# Patient Record
Sex: Male | Born: 1965
Health system: Southern US, Community
[De-identification: ages and names within clinical notes are randomized; demographics above are authoritative.]

## PROBLEM LIST (undated history)

## (undated) DIAGNOSIS — I2510 Atherosclerotic heart disease of native coronary artery without angina pectoris: Secondary | ICD-10-CM

## (undated) DIAGNOSIS — J410 Simple chronic bronchitis: Secondary | ICD-10-CM

## (undated) DIAGNOSIS — J910 Malignant pleural effusion: Secondary | ICD-10-CM

## (undated) DIAGNOSIS — J302 Other seasonal allergic rhinitis: Secondary | ICD-10-CM

## (undated) DIAGNOSIS — R609 Edema, unspecified: Secondary | ICD-10-CM

## (undated) DIAGNOSIS — K4021 Bilateral inguinal hernia, without obstruction or gangrene, recurrent: Secondary | ICD-10-CM

## (undated) DIAGNOSIS — I252 Old myocardial infarction: Secondary | ICD-10-CM

## (undated) DIAGNOSIS — K219 Gastro-esophageal reflux disease without esophagitis: Secondary | ICD-10-CM

## (undated) DIAGNOSIS — R21 Rash and other nonspecific skin eruption: Secondary | ICD-10-CM

## (undated) DIAGNOSIS — Z955 Presence of coronary angioplasty implant and graft: Secondary | ICD-10-CM

## (undated) DIAGNOSIS — I428 Other cardiomyopathies: Secondary | ICD-10-CM

## (undated) DIAGNOSIS — C801 Malignant (primary) neoplasm, unspecified: Secondary | ICD-10-CM

## (undated) DIAGNOSIS — Z8679 Personal history of other diseases of the circulatory system: Secondary | ICD-10-CM

## (undated) DIAGNOSIS — I10 Essential (primary) hypertension: Secondary | ICD-10-CM

## (undated) DIAGNOSIS — I219 Acute myocardial infarction, unspecified: Secondary | ICD-10-CM

## (undated) DIAGNOSIS — Z973 Presence of spectacles and contact lenses: Secondary | ICD-10-CM

## (undated) HISTORY — DX: Essential (primary) hypertension: I10

## (undated) HISTORY — PX: CORONARY ANGIOPLASTY WITH STENT PLACEMENT: SHX49

## (undated) HISTORY — DX: Atherosclerotic heart disease of native coronary artery without angina pectoris: I25.10

## (undated) HISTORY — PX: INGUINAL HERNIA REPAIR: SUR1180

---

## 1989-06-16 HISTORY — PX: MANDIBLE FRACTURE SURGERY: SHX706

## 2010-05-18 ENCOUNTER — Encounter: Payer: Self-pay | Admitting: Cardiovascular Disease

## 2010-05-18 ENCOUNTER — Ambulatory Visit: Payer: Self-pay | Admitting: Cardiovascular Disease

## 2010-05-18 ENCOUNTER — Inpatient Hospital Stay (HOSPITAL_COMMUNITY): Admission: EM | Admit: 2010-05-18 | Discharge: 2010-05-19 | Payer: Self-pay | Admitting: Emergency Medicine

## 2010-05-18 HISTORY — PX: TRANSTHORACIC ECHOCARDIOGRAM: SHX275

## 2010-05-21 ENCOUNTER — Telehealth: Payer: Self-pay | Admitting: Physician Assistant

## 2010-05-23 ENCOUNTER — Telehealth: Payer: Self-pay | Admitting: Cardiovascular Disease

## 2010-05-30 ENCOUNTER — Telehealth: Payer: Self-pay | Admitting: Cardiovascular Disease

## 2010-05-31 DIAGNOSIS — I2589 Other forms of chronic ischemic heart disease: Secondary | ICD-10-CM | POA: Insufficient documentation

## 2010-05-31 DIAGNOSIS — F17210 Nicotine dependence, cigarettes, uncomplicated: Secondary | ICD-10-CM

## 2010-05-31 DIAGNOSIS — I2510 Atherosclerotic heart disease of native coronary artery without angina pectoris: Secondary | ICD-10-CM

## 2010-05-31 DIAGNOSIS — I10 Essential (primary) hypertension: Secondary | ICD-10-CM | POA: Insufficient documentation

## 2010-05-31 DIAGNOSIS — F1910 Other psychoactive substance abuse, uncomplicated: Secondary | ICD-10-CM

## 2010-05-31 DIAGNOSIS — I214 Non-ST elevation (NSTEMI) myocardial infarction: Secondary | ICD-10-CM | POA: Insufficient documentation

## 2010-06-08 ENCOUNTER — Encounter (INDEPENDENT_AMBULATORY_CARE_PROVIDER_SITE_OTHER): Payer: Self-pay | Admitting: Cardiology

## 2010-06-08 ENCOUNTER — Ambulatory Visit: Payer: Self-pay | Admitting: Cardiovascular Disease

## 2010-10-03 ENCOUNTER — Telehealth: Payer: Self-pay | Admitting: Cardiovascular Disease

## 2010-10-14 ENCOUNTER — Ambulatory Visit: Payer: Self-pay | Admitting: Cardiovascular Disease

## 2010-11-15 NOTE — Assessment & Plan Note (Signed)
Summary: eph/jml   Visit Type:  Follow-up Primary Provider:  none  CC:  no cardiac complaints.  History of Present Illness: 45 yo WM with history of tobacco abuse, HTN and CAD who was admitted to [facility] on May 18, 2010 with a NSTEMI and was found to have severe obstructive disease in the LAD. Two drug eluting stents were placed in the LAD. Mild hypokinesis of the anterolateral wall. He has done well since discharge. He has been taking all of his medications. He has had no recurrent chest pain, SOB or palpitations. He is asking to return to work.   Current Medications (verified): 1)  Aspirin Ec 325 Mg Tbec (Aspirin) .... Take One Tablet By Mouth Daily 2)  Lisinopril 5 Mg Tabs (Lisinopril) .Marland Kitchen.. 1 Tab Once Daily 3)  Metoprolol Tartrate 25 Mg Tabs (Metoprolol Tartrate) .Marland Kitchen.. 1 Tab Two Times A Day 4)  Nicotine 21 Mg/24hr Pt24 (Nicotine) .... Apply Once Daily X 6 Weeks 5)  Nicotine 14 Mg/24hr Pt24 (Nicotine) .... Start After 6 Weeks of The 21 Mg Patch.Marland Kitchenapply Patch X 2 Wks Then Cut in Half and Used 1/2 Dose X 2 Wks 6)  Nitrostat 0.4 Mg Subl (Nitroglycerin) .Marland Kitchen.. 1 Tablet Under Tongue At Onset of Chest Pain; You May Repeat Every 5 Minutes For Up To 3 Doses. 7)  Effient 10 Mg Tabs (Prasugrel Hcl) .Marland Kitchen.. 1 Tab Once Daily 8)  Simvastatin 40 Mg Tabs (Simvastatin) .Marland Kitchen.. 1 Tab At Bedtime 9)  Tylenol 325 Mg Tabs (Acetaminophen) .Marland Kitchen.. 1 Tab As Directed  Allergies (verified): No Known Drug Allergies  Past History:  Past Medical History: CAD s/p cath May 18, 2010 with overlapping drug eluting stents LAD. Tobacco abuse HTN  Past Surgical History: Status post double hernia repair Broken jaw repair  Family History: Mother-alive, healthy Father deceased, CAD/brain tumor One sister and two brothers alive and well, no CAD  Social History: The patient lives in [place] with his wife.   He has  3 children.   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  He has a  35-pack-year smoking history, he has stopped smoking.  He does drink socially as well as uses marijuana.      Review of Systems  The patient denies fatigue, malaise, fever, weight gain/loss, vision loss, decreased hearing, hoarseness, chest pain, palpitations, shortness of breath, prolonged cough, wheezing, sleep apnea, coughing up blood, abdominal pain, blood in stool, nausea, vomiting, diarrhea, heartburn, incontinence, blood in urine, muscle weakness, joint pain, leg swelling, rash, skin lesions, headache, fainting, dizziness, depression, anxiety, enlarged lymph nodes, easy bruising or bleeding, and environmental allergies.    Vital Signs:  Patient profile:   45 year old male Height:      69 inches Weight:      167 pounds BMI:     24.75 Pulse rate:   88 / minute BP sitting:   133 / 78  (left arm) Cuff size:   regular  Vitals Entered By: Burnett Kanaris, CNA (June 08, 2010 2:02 PM)  Physical Exam  General:  General: Well developed, well nourished, NAD HEENT: OP clear, mucus membranes moist SKIN: warm, dry Neuro: No focal deficits Musculoskeletal: Muscle strength 5/5 all ext Psychiatric: Mood and affect normal Neck: No JVD, no carotid bruits, no thyromegaly, no lymphadenopathy. Lungs:Clear bilaterally, no wheezes, rhonci, crackles CV: RRR no murmurs, gallops rubs Abdomen: soft, NT, ND, BS present Extremities: No edema, pulses 2+.    EKG  Procedure date:  06/08/2010  Findings:  NSR, rate 88 bpm. Poor R wave progression precordial leads.   Echocardiogram  Procedure date:  05/18/2010  Findings:       Left ventricle: Septal, apical and inferoapical hypokinesis The     cavity size was mildly dilated. Wall thickness was normal.     Systolic function was mildly reduced. The estimated ejection     fraction was in the range of 45% to 50%.   - Atrial septum: No defect or patent foramen ovale was identified.  Cardiac Cath  Procedure date:  05/18/2010  Findings:       1. The left main coronary artery had no evidence of disease.   2. The left anterior descending was a large vessel that coursed to the       apex.  The proximal portion of the vessel had a long tubular 80%       stenosis that extended just past the first diagonal branch.  The       mid vessel had a hazy ulcerated-appearing 90% stenosis.  The distal       vessel had no obstructive disease.  The first diagonal branch was       small in caliber and had a 90% proximal stenosis.  The second and       third diagonal branches were very small in caliber.   3. The circumflex artery was a moderate-sized vessel that gave off a       large bifurcating obtuse marginal branch.  The AV groove circumflex       was very small in caliber.  The second obtuse marginal branch was       moderate size in caliber.  The ostial portion of the circumflex       appeared to have a 30% plaque.  The mid vessel had a 20% plaque.       The first obtuse marginal branch was a moderate-sized bifurcating       vessel with mild plaque disease.  The second obtuse marginal branch       appeared to have a 50% stenosis.   4. The right coronary artery was a moderate-sized dominant vessel with       mild plaque disease in the midportion of the vessel.   5. Left ventricular angiogram was performed in the RAO projection and       showed hypokinesis of anterolateral wall and apex with overall       ejection fraction of 50%.      IMPRESSION:   1. Single-vessel coronary artery disease.   2. Successful percutaneous coronary intervention with placement of a       drug-eluting stent in the mid LAD and a drug-eluting stent in the       proximal LAD.   3. Segmental wall motion abnormality with preserved left ventricular       systolic function.   Impression & Recommendations:  Problem # 1:  CAD, NATIVE VESSEL (ICD-414.01)  s/p NSTEMI 3 weeks ago, now s/p drug eluting stents x 2 in the LAD. He is tolerating the beta blocker,  Ace-inhibito well. He is on ASA and Effient for dual antiplatelet therapy. He was able to get one month free at time of discharge. He has applied for the assistance program for Effient. I am giving him an additional 6 week supply today of samples. No chest pain or SOB since discharge.   His updated medication list for this problem includes:    Aspirin Ec 325 Mg  Tbec (Aspirin) .Marland Kitchen... Take one tablet by mouth daily    Lisinopril 5 Mg Tabs (Lisinopril) .Marland Kitchen... 1 tab once daily    Metoprolol Tartrate 25 Mg Tabs (Metoprolol tartrate) .Marland Kitchen... 1 tab two times a day    Nitrostat 0.4 Mg Subl (Nitroglycerin) .Marland Kitchen... 1 tablet under tongue at onset of chest pain; you may repeat every 5 minutes for up to 3 doses.    Effient 10 Mg Tabs (Prasugrel hcl) .Marland Kitchen... 1 tab once daily  His updated medication list for this problem includes:    Aspirin Ec 325 Mg Tbec (Aspirin) .Marland Kitchen... Take one tablet by mouth daily    Lisinopril 5 Mg Tabs (Lisinopril) .Marland Kitchen... 1 tab once daily    Metoprolol Tartrate 25 Mg Tabs (Metoprolol tartrate) .Marland Kitchen... 1 tab two times a day    Nitrostat 0.4 Mg Subl (Nitroglycerin) .Marland Kitchen... 1 tablet under tongue at onset of chest pain; you may repeat every 5 minutes for up to 3 doses.    Effient 10 Mg Tabs (Prasugrel hcl) .Marland Kitchen... 1 tab once daily  Orders: EKG w/ Interpretation (93000)  Problem # 2:  CARDIOMYOPATHY, ISCHEMIC (ICD-414.8) Volume status ok. Will repeat echo after next appt.   His updated medication list for this problem includes:    Aspirin Ec 325 Mg Tbec (Aspirin) .Marland Kitchen... Take one tablet by mouth daily    Lisinopril 5 Mg Tabs (Lisinopril) .Marland Kitchen... 1 tab once daily    Metoprolol Tartrate 25 Mg Tabs (Metoprolol tartrate) .Marland Kitchen... 1 tab two times a day    Nitrostat 0.4 Mg Subl (Nitroglycerin) .Marland Kitchen... 1 tablet under tongue at onset of chest pain; you may repeat every 5 minutes for up to 3 doses.    Effient 10 Mg Tabs (Prasugrel hcl) .Marland Kitchen... 1 tab once daily  Problem # 3:  HYPERTENSION, BORDERLINE (ICD-401.9) BP  well controlled.   His updated medication list for this problem includes:    Aspirin Ec 325 Mg Tbec (Aspirin) .Marland Kitchen... Take one tablet by mouth daily    Lisinopril 5 Mg Tabs (Lisinopril) .Marland Kitchen... 1 tab once daily    Metoprolol Tartrate 25 Mg Tabs (Metoprolol tartrate) .Marland Kitchen... 1 tab two times a day  Patient Instructions: 1)  Your physician recommends that you schedule a follow-up appointment in: 6 months 2)  Your physician recommends that you continue on your current medications as directed. Please refer to the Current Medication list given to you today. Prescriptions: EFFIENT 10 MG TABS (PRASUGREL HCL) 1 tab once daily  #42 x 0   Entered by:   Whitney Maeola Sarah RN   Authorized by:   Verne Carrow, MD   Signed by:   Ellender Hose RN on 06/08/2010   Method used:   Samples Given   RxID:   [id number]

## 2010-11-15 NOTE — Progress Notes (Signed)
Summary: assitance program has questions regarding effient  Phone Note Refill Request Call back at [phone] Message from:  Pharmacy on [place] care pt assit program  ref# [id number] / has questions regarding application for effient  Initial call taken by: Omer Jack,  May 30, 2010 11:02 AM  Follow-up for Phone Call        [facility] customer service...they advised me that they need  proof of income for application. They will call patient for documentation of income. Follow-up by: Suzan Garibaldi RN     Appended Document: assitance program has questions regarding effient Spoke with [facility] cares. Pt has been approved for assitance program. Effient should arrive in office in 7-10 business days.  Appended Document: assitance program has questions regarding effient Effient here & pt.notified. Medication is at the front desk.

## 2010-11-15 NOTE — Letter (Signed)
Summary: Return To Work  [employer], Main Office  [address]   [place], [place] [address]   Phone: [phone]  Fax: [phone]    06/08/2010  TO: Leodis Sias IT MAY CONCERN   RE: KANON NOVOSEL [address] [id number]   The above named individual is under my medical care and may return to work on: August 24th, 2011.  If you have any further questions or need additional information, please call.     Sincerely,  Whitney Maeola Sarah RN for Verne Carrow, MD

## 2010-11-15 NOTE — Progress Notes (Signed)
Summary: CALLING REAGRDING PRESCRIPTION FOR XANAX  Phone Note Call from Patient Call back at [phone]   Caller: Patient Summary of Call: PT CALLING TO GET A PRESCRIPTION FOR Anthony Moon Initial call taken by: Judie Grieve,  May 23, 2010 9:06 AM  Follow-up for Phone Call        I talked with pt and told him Dr. Clifton James would not prescribe Xanax for him.  Follow-up by: Dossie Arbour, RN, BSN,  May 23, 2010 10:18 AM     Appended Document: CALLING REAGRDING PRESCRIPTION FOR Anthony Moon Effient arrived in the office and placed at the front desk for pick-up.  Pt aware. Order #[id number].

## 2010-11-15 NOTE — Letter (Signed)
Summary: Cardiac Rehab Program  Cardiac Rehab Program   Imported By: Marylou Brandle 06/06/2010 15:38:10  _____________________________________________________________________  External Attachment:    Type:   Image     Comment:   External Document

## 2010-11-15 NOTE — Progress Notes (Signed)
  Phone Note Call from Patient   Summary of Call: Patient called with complaints of anxiety.  Just d/c from [facility] with NSTEMI. UDS + for THC and benzo's. Had xanax couple times while here. Counseled on d/c drug use and nonauthorized use of benzo's prior to d/c Anthony Moon now asking for xanax Rx.  Has anxiety over hospital bill, etc.  Tried to quit cigs.  Smoked x 1 today b/c he is so stressed. Explained to Anthony Moon that I cannot prescribe xanax to him given his history.  Rx should have been requested on d/c. I will Rx vistaril for him to use as needed for anxiety. He is agreeable to this. Warned him of side effects.  He should not drive when taking. Initial call taken by: Tereso Newcomer PA-C,  May 21, 2010 11:38 AM    New/Updated Medications: VISTARIL 25 MG CAPS (HYDROXYZINE PAMOATE) Take 1 by mouth every 8 hours as needed for anxiety Prescriptions: VISTARIL 25 MG CAPS (HYDROXYZINE PAMOATE) Take 1 by mouth every 8 hours as needed for anxiety  #20 x 0   Entered and Authorized by:   Tereso Newcomer PA-C   Signed by:   Tereso Newcomer PA-C on 05/21/2010   Method used:   Electronically to        [facility] # [phone]* (retail)       [address]       [place], [place]  [address]       Ph: [phone]       Fax: [phone]   RxID:   [id number]

## 2010-11-17 NOTE — Assessment & Plan Note (Signed)
Summary: per pt spouse call pt is SOB and has to have dental work and ...   Visit Type:  ov Primary Provider:  none  CC:  shortness of breath when he bends over and .  History of Present Illness: 45 yo WM with history of tobacco abuse, HTN and CAD who was admitted to [facility] on May 18, 2010 with a NSTEMI and was found to have severe obstructive disease in the LAD. Two drug eluting stents were placed in the LAD. Mild hypokinesis of the anterolateral wall. He has done well since discharge. He has been taking all of his medications. He has gained weight. His activity level has been down. He has stopped smoking completely. 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 No chest pain. Occasional SOB when bending over secondary to his weight gain.   Current Medications (verified): 1)  Aspirin Ec 325 Mg Tbec (Aspirin) .... Take One Tablet By Mouth Daily 2)  Lisinopril 5 Mg Tabs (Lisinopril) .Marland Kitchen.. 1 Tab Once Daily 3)  Metoprolol Tartrate 25 Mg Tabs (Metoprolol Tartrate) .Marland Kitchen.. 1 Tab Two Times A Day 4)  Nitrostat 0.4 Mg Subl (Nitroglycerin) .Marland Kitchen.. 1 Tablet Under Tongue At Onset of Chest Pain; You May Repeat Every 5 Minutes For Up To 3 Doses. 5)  Effient 10 Mg Tabs (Prasugrel Hcl) .Marland Kitchen.. 1 Tab Once Daily 6)  Simvastatin 40 Mg Tabs (Simvastatin) .Marland Kitchen.. 1 Tab At Bedtime 7)  Tylenol 325 Mg Tabs (Acetaminophen) .Marland Kitchen.. 1 Tab As Directed 8)  Hydrocodone-Acetaminophen 5-500 Mg Tabs (Hydrocodone-Acetaminophen) .Marland Kitchen.. 1 Tablet Every 4-6 Hrs As Needed,  Allergies (verified): No Known Drug Allergies  Past History:  Past Medical History: CAD s/p cath May 18, 2010 with overlapping drug eluting stents LAD. Tobacco abuse-stopped smoking HTN  Social History: Reviewed history from 06/08/2010 and no changes required. The patient lives in [place] with his wife.   He has  3 children.  Enrolled in [employer]. Works at [employer] station He has a 35-pack-year  smoking history, he has stopped smoking.  He does drink socially      Review of Systems  The patient denies fatigue, malaise, fever, weight gain/loss, vision loss, decreased hearing, hoarseness, chest pain, palpitations, shortness of breath, prolonged cough, wheezing, sleep apnea, coughing up blood, abdominal pain, blood in stool, nausea, vomiting, diarrhea, heartburn, incontinence, blood in urine, muscle weakness, joint pain, leg swelling, rash, skin lesions, headache, fainting, dizziness, depression, anxiety, enlarged lymph nodes, easy bruising or bleeding, and environmental allergies.    Vital Signs:  Patient profile:   45 year old male Height:      69 inches Weight:      180.75 pounds BMI:     26.79 Pulse rate:   66 / minute BP sitting:   130 / 84  (left arm) Cuff size:   regular  Vitals Entered By: Caralee Ates CMA (October 14, 2010 2:53 PM)  Physical Exam  General:  General: Well developed, well nourished, NAD Musculoskeletal: Muscle strength 5/5 all ext Psychiatric: Mood and affect normal Neck: No JVD, no carotid bruits, no thyromegaly, no lymphadenopathy. Lungs:Clear bilaterally, no wheezes, rhonci, crackles CV: RRR no murmurs, gallops rubs Abdomen: soft, NT, ND, BS present Extremities: No edema, pulses 2+.    Impression & Recommendations:  Problem # 1:  CAD, NATIVE VESSEL (ICD-414.01) Stable.  Will continue ASA and Effient for at least one year. Will reduce ASA dose to 81 mg by mouth Qdaily.  Continue other meds. I will see him back in 6 months. Will arrange repeat echo in 6 months and will arrange fasting lipids and LFTs in 6 months when they have insurance.  We will try to help with Effient. He should have Effient from the manufacturer coming to our office.   His updated medication list for this problem includes:    Aspirin Ec 325 Mg Tbec (Aspirin) .Marland Kitchen... Take one tablet by mouth daily    Lisinopril 5 Mg Tabs (Lisinopril) .Marland Kitchen... 1 tab once daily    Metoprolol Tartrate  25 Mg Tabs (Metoprolol tartrate) .Marland Kitchen... 1 tab two times a day    Nitrostat 0.4 Mg Subl (Nitroglycerin) .Marland Kitchen... 1 tablet under tongue at onset of chest pain; you may repeat every 5 minutes for up to 3 doses.    Effient 10 Mg Tabs (Prasugrel hcl) .Marland Kitchen... 1 tab once daily  Patient Instructions: 1)  Your physician recommends that you schedule a follow-up appointment in: 6 months with Dr. Clifton James 2)  Your physician has recommended you make the following change in your medication:

## 2010-11-17 NOTE — Progress Notes (Signed)
Summary: need medical clearance for dental work  Phone Note Call from Patient Call back at [facility] Phone [phone]   Caller: [phone] Reason for Call: Talk to Nurse Summary of Call: pt need dental work - tooth pull need medical clearance. Initial call taken by: Lorne Skeens,  October 03, 2010 1:36 PM  Follow-up for Phone Call        Patient has a broken tooth and will probably need to be off of Effient in order to repair the tooth. I explained to him that since he needs to be on Effient for at least a year so until August 2012 that we could not clear him from a cardiac standpoint. He understands.  Whitney Maeola Sarah RN  October 03, 2010 5:22 PM  Follow-up by: Whitney Maeola Sarah RN,  October 03, 2010 5:23 PM

## 2010-12-17 ENCOUNTER — Telehealth (INDEPENDENT_AMBULATORY_CARE_PROVIDER_SITE_OTHER): Payer: Self-pay | Admitting: *Deleted

## 2010-12-22 NOTE — Progress Notes (Addendum)
Summary: Cardiology Phone Note - Refills  Phone Note Call from Patient   Reason for Call: Refill Medication Summary of Call: Returned pt call concerning refills for Simvastatin, Lopressor and Lisinopril.  I have called in enough refills until time for his next follow up.  I have called them into [facility] at [phone].  Pt appreciated the call back. 1. Simvastatin 40mg  daily #30 RF3 2. Lopressor 25mg  two times a day #60 RF3 3. Lisinopril 5 mg daily #30 RF3 Initial call taken by: Robbi Garter NP-PA,  December 17, 2010 4:23 PM

## 2010-12-23 ENCOUNTER — Inpatient Hospital Stay (HOSPITAL_COMMUNITY)
Admission: EM | Admit: 2010-12-23 | Discharge: 2010-12-25 | DRG: 315 | Disposition: A | Discharge status: Home / Self Care | Payer: Medicaid Other | Attending: Cardiovascular Disease | Admitting: Cardiovascular Disease

## 2010-12-23 ENCOUNTER — Emergency Department (HOSPITAL_COMMUNITY): Payer: Medicaid Other

## 2010-12-23 DIAGNOSIS — I428 Other cardiomyopathies: Secondary | ICD-10-CM | POA: Diagnosis present

## 2010-12-23 DIAGNOSIS — I252 Old myocardial infarction: Secondary | ICD-10-CM

## 2010-12-23 DIAGNOSIS — Z7982 Long term (current) use of aspirin: Secondary | ICD-10-CM

## 2010-12-23 DIAGNOSIS — Z87891 Personal history of nicotine dependence: Secondary | ICD-10-CM

## 2010-12-23 DIAGNOSIS — K209 Esophagitis, unspecified without bleeding: Secondary | ICD-10-CM | POA: Diagnosis present

## 2010-12-23 DIAGNOSIS — Z9861 Coronary angioplasty status: Secondary | ICD-10-CM

## 2010-12-23 DIAGNOSIS — R079 Chest pain, unspecified: Secondary | ICD-10-CM

## 2010-12-23 DIAGNOSIS — K219 Gastro-esophageal reflux disease without esophagitis: Secondary | ICD-10-CM | POA: Diagnosis present

## 2010-12-23 DIAGNOSIS — I2510 Atherosclerotic heart disease of native coronary artery without angina pectoris: Secondary | ICD-10-CM | POA: Diagnosis present

## 2010-12-23 DIAGNOSIS — I10 Essential (primary) hypertension: Secondary | ICD-10-CM | POA: Diagnosis present

## 2010-12-23 DIAGNOSIS — E785 Hyperlipidemia, unspecified: Secondary | ICD-10-CM | POA: Diagnosis present

## 2010-12-23 DIAGNOSIS — I300 Acute nonspecific idiopathic pericarditis: Principal | ICD-10-CM | POA: Diagnosis present

## 2010-12-23 DIAGNOSIS — Z79899 Other long term (current) drug therapy: Secondary | ICD-10-CM

## 2010-12-23 LAB — CBC
HCT: 43.5 % (ref 39.0–52.0)
MCH: 32.8 pg (ref 26.0–34.0)
MCHC: 35.6 g/dL (ref 30.0–36.0)
MCV: 92 fL (ref 78.0–100.0)
RBC: 4.73 MIL/uL (ref 4.22–5.81)
WBC: 15.9 10*3/uL — ABNORMAL HIGH (ref 4.0–10.5)

## 2010-12-23 LAB — DIFFERENTIAL
Basophils Absolute: 0 10*3/uL (ref 0.0–0.1)
Basophils Relative: 0 % (ref 0–1)
Eosinophils Absolute: 0.2 10*3/uL (ref 0.0–0.7)
Eosinophils Relative: 1 % (ref 0–5)
Lymphocytes Relative: 13 % (ref 12–46)
Lymphs Abs: 2 10*3/uL (ref 0.7–4.0)
Neutro Abs: 12.6 10*3/uL — ABNORMAL HIGH (ref 1.7–7.7)
Neutrophils Relative %: 79 % — ABNORMAL HIGH (ref 43–77)

## 2010-12-23 LAB — HEPATIC FUNCTION PANEL
AST: 30 U/L (ref 0–37)
Albumin: 4.1 g/dL (ref 3.5–5.2)
Indirect Bilirubin: 0.7 mg/dL (ref 0.3–0.9)
Total Bilirubin: 0.9 mg/dL (ref 0.3–1.2)

## 2010-12-23 LAB — BASIC METABOLIC PANEL
BUN: 16 mg/dL (ref 6–23)
Chloride: 98 mEq/L (ref 96–112)
Glucose, Bld: 101 mg/dL — ABNORMAL HIGH (ref 70–99)
Sodium: 132 mEq/L — ABNORMAL LOW (ref 135–145)

## 2010-12-23 LAB — POCT CARDIAC MARKERS
CKMB, poc: 1.4 ng/mL (ref 1.0–8.0)
Myoglobin, poc: 58.6 ng/mL (ref 12–200)

## 2010-12-23 LAB — PROTIME-INR: Prothrombin Time: 12.4 seconds (ref 11.6–15.2)

## 2010-12-24 DIAGNOSIS — R072 Precordial pain: Secondary | ICD-10-CM

## 2010-12-24 DIAGNOSIS — I319 Disease of pericardium, unspecified: Secondary | ICD-10-CM

## 2010-12-24 LAB — HEPARIN LEVEL (UNFRACTIONATED): Heparin Unfractionated: 0.29 IU/mL — ABNORMAL LOW (ref 0.30–0.70)

## 2010-12-24 LAB — MRSA PCR SCREENING: MRSA by PCR: NEGATIVE

## 2010-12-24 LAB — COMPREHENSIVE METABOLIC PANEL
ALT: 46 U/L (ref 0–53)
AST: 23 U/L (ref 0–37)
Alkaline Phosphatase: 51 U/L (ref 39–117)
Creatinine, Ser: 0.94 mg/dL (ref 0.4–1.5)
Sodium: 135 mEq/L (ref 135–145)
Total Bilirubin: 0.9 mg/dL (ref 0.3–1.2)
Total Protein: 6.8 g/dL (ref 6.0–8.3)

## 2010-12-24 LAB — CK TOTAL AND CKMB (NOT AT ARMC)
Relative Index: 1.6 (ref 0.0–2.5)
Relative Index: 1.7 (ref 0.0–2.5)
Total CK: 110 U/L (ref 7–232)

## 2010-12-24 LAB — LIPID PANEL
Cholesterol: 150 mg/dL (ref 0–200)
VLDL: 14 mg/dL (ref 0–40)

## 2010-12-27 ENCOUNTER — Telehealth: Payer: Self-pay | Admitting: Cardiovascular Disease

## 2010-12-30 LAB — PROTIME-INR
INR: 0.96 (ref 0.00–1.49)
Prothrombin Time: 13 seconds (ref 11.6–15.2)
Prothrombin Time: 13.4 seconds (ref 11.6–15.2)

## 2010-12-30 LAB — LIPID PANEL
HDL: 57 mg/dL (ref >39)
LDL Cholesterol: 116 mg/dL — ABNORMAL HIGH (ref 0–99)
Triglycerides: 108 mg/dL (ref <150)
VLDL: 22 mg/dL (ref 0–40)

## 2010-12-30 LAB — CBC
HCT: 41.5 % (ref 39.0–52.0)
HCT: 44.8 % (ref 39.0–52.0)
Hemoglobin: 14.3 g/dL (ref 13.0–17.0)
Hemoglobin: 15.6 g/dL (ref 13.0–17.0)
MCH: 32.4 pg (ref 26.0–34.0)
MCH: 32.7 pg (ref 26.0–34.0)
MCV: 92.9 fL (ref 78.0–100.0)
Platelets: 203 10*3/uL (ref 150–400)
RBC: 4.24 MIL/uL (ref 4.22–5.81)
RBC: 4.37 MIL/uL (ref 4.22–5.81)
RBC: 4.82 MIL/uL (ref 4.22–5.81)
RDW: 13 % (ref 11.5–15.5)
WBC: 10.4 10*3/uL (ref 4.0–10.5)

## 2010-12-30 LAB — POCT I-STAT, CHEM 8
BUN: 10 mg/dL (ref 6–23)
Calcium, Ion: 1.07 mmol/L — ABNORMAL LOW (ref 1.12–1.32)
Calcium, Ion: 1.12 mmol/L (ref 1.12–1.32)
Chloride: 101 mEq/L (ref 96–112)
Creatinine, Ser: 1 mg/dL (ref 0.4–1.5)
HCT: 42 % (ref 39.0–52.0)
Hemoglobin: 16.7 g/dL (ref 13.0–17.0)
Potassium: 3.4 mEq/L — ABNORMAL LOW (ref 3.5–5.1)
Sodium: 136 mEq/L (ref 135–145)

## 2010-12-30 LAB — BASIC METABOLIC PANEL
CO2: 29 mEq/L (ref 19–32)
Calcium: 9 mg/dL (ref 8.4–10.5)
GFR calc Af Amer: >60 mL/min (ref >60)
Glucose, Bld: 114 mg/dL — ABNORMAL HIGH (ref 70–99)
Potassium: 4.1 mEq/L (ref 3.5–5.1)
Sodium: 140 mEq/L (ref 135–145)

## 2010-12-30 LAB — URINE CULTURE
Colony Count: NO GROWTH
Culture  Setup Time: 201108031920
Culture: NO GROWTH

## 2010-12-30 LAB — COMPREHENSIVE METABOLIC PANEL
AST: 62 U/L — ABNORMAL HIGH (ref 0–37)
Albumin: 3.6 g/dL (ref 3.5–5.2)
Alkaline Phosphatase: 62 U/L (ref 39–117)
BUN: 10 mg/dL (ref 6–23)
Chloride: 97 mEq/L (ref 96–112)
Creatinine, Ser: 0.79 mg/dL (ref 0.4–1.5)
GFR calc Af Amer: >60 mL/min (ref >60)
Potassium: 3.2 mEq/L — ABNORMAL LOW (ref 3.5–5.1)
Total Bilirubin: 0.7 mg/dL (ref 0.3–1.2)
Total Protein: 6 g/dL (ref 6.0–8.3)

## 2010-12-30 LAB — URINALYSIS, MICROSCOPIC ONLY
Bilirubin Urine: NEGATIVE
Leukocytes, UA: NEGATIVE
Nitrite: NEGATIVE
Specific Gravity, Urine: 1.043 — ABNORMAL HIGH (ref 1.005–1.030)
Urobilinogen, UA: 0.2 mg/dL (ref 0.0–1.0)
pH: 8.5 — ABNORMAL HIGH (ref 5.0–8.0)

## 2010-12-30 LAB — DIFFERENTIAL
Basophils Absolute: 0 10*3/uL (ref 0.0–0.1)
Eosinophils Absolute: 0.1 10*3/uL (ref 0.0–0.7)
Eosinophils Relative: 1 % (ref 0–5)
Lymphocytes Relative: 10 % — ABNORMAL LOW (ref 12–46)
Lymphocytes Relative: 10 % — ABNORMAL LOW (ref 12–46)
Monocytes Absolute: 0.9 10*3/uL (ref 0.1–1.0)
Monocytes Relative: 8 % (ref 3–12)
Monocytes Relative: 9 % (ref 3–12)
Neutro Abs: 12.1 10*3/uL — ABNORMAL HIGH (ref 1.7–7.7)
Neutro Abs: 8.3 10*3/uL — ABNORMAL HIGH (ref 1.7–7.7)
Neutrophils Relative %: 82 % — ABNORMAL HIGH (ref 43–77)

## 2010-12-30 LAB — RAPID URINE DRUG SCREEN, HOSP PERFORMED
Amphetamines: NOT DETECTED
Tetrahydrocannabinol: POSITIVE — AB

## 2010-12-30 LAB — CARDIAC PANEL(CRET KIN+CKTOT+MB+TROPI)
Relative Index: 4.1 — ABNORMAL HIGH (ref 0.0–2.5)
Troponin I: 9.03 ng/mL (ref 0.00–0.06)

## 2010-12-30 LAB — APTT: aPTT: >200 seconds (ref 24–37)

## 2010-12-30 LAB — POCT CARDIAC MARKERS: Myoglobin, poc: 496 ng/mL (ref 12–200)

## 2011-01-03 NOTE — Progress Notes (Signed)
Summary: pt wants to talk to provider   Phone Note Call from Patient Call back at Home Phone [phone]   Caller: Spouse Reason for Call: Talk to Nurse, Talk to Doctor Summary of Call: pt spouse has some questions.Anthony Moon was seen in the [facility] this weekend but is home now and he is ok but they really would like to talk to Dr. Clifton James cause they have questions Initial call taken by: Omer Jack,  December 27, 2010 12:48 PM  Follow-up for Phone Call        Patient needed to make a f/u appt to see Dr. Clifton James post hospital. He is scheduled 01/16/11. Whitney Maeola Sarah RN  December 27, 2010 2:02 PM  Follow-up by: Whitney Maeola Sarah RN,  December 27, 2010 2:02 PM

## 2011-01-06 ENCOUNTER — Encounter: Payer: Self-pay | Admitting: *Deleted

## 2011-01-12 NOTE — H&P (Signed)
Anthony Moon, Anthony NO.:  [id number]  MEDICAL RECORD NO.:  [id number]           PATIENT TYPE:  I  LOCATION:  2508                         FACILITY:  [facility]  PHYSICIAN:  Anthony Flor, MD   DATE OF BIRTH:  1966/03/09  DATE OF ADMISSION:  12/23/2010 DATE OF DISCHARGE:                             HISTORY & PHYSICAL   ADMISSION DIAGNOSES: 1. Chest pain. 2. Coronary artery disease.  CHIEF COMPLAINT:  Chest pain.  HISTORY OF PRESENT ILLNESS:  Anthony Moon is a pleasant 45 year old white male with known coronary disease with a non-ST elevation MI in August 2011 status post drug-eluting stent x2 to his proximal and mid LAD who presents to the ER tonight with chest pain.  His pain began at 2:30 p.m. while he was drinking water.  It came on suddenly and felt like a sharp pressure in his chest.  Since that time, it is coming on in waves as bad as 7/10.  He does not think it is worse with lying flat and better with sitting up.  He does not have any exertional symptoms.  He has not had shortness of breath or nausea or diaphoresis.  The pain was minimally responsive to nitroglycerin, but improved with morphine in the emergency room.  PAST MEDICAL HISTORY: 1. Hypertension. 2. Hyperlipidemia. 3. Coronary artery disease:  Non-ST-elevation MI, August 2011,     receiving Promus drug-eluting stent x2 to the proximal mid LAD. 4. Previous inguinal hernia repair.  MEDICATIONS: 1. Metoprolol 25 mg b.i.d. 2. Aspirin 325 daily.  This was recently decreased to 81, but he is     still taking 325 currently. 3. Lisinopril 5 mg daily. 4. Effient 10 mg daily. 5. Simvastatin 40 mg daily.  ALLERGIES:  None.  FAMILY HISTORY:  He has a father who had an MI at age 20, but there is no early coronary disease.  SOCIAL HISTORY:  He currently is working as a Conservation officer, nature, drinks approximately 12 beers per week.  He used to smoke up to 2 packs per day, but quit in August at the time of his  myocardial infarction.  REVIEW OF SYSTEMS:  Full review of systems was obtained and is negative except as stated in the HPI.  PHYSICAL EXAMINATION:  VITAL SIGNS:  Blood pressure 162/92, now 128/80, pulse 102, respirations 20, O2 sats 98% on room air, temperature 98.1. GENERAL:  No acute distress. HEENT:  Extraocular movements intact.  Oropharynx benign.  Nonicteric sclera. NECK:  Supple. CARDIOVASCULAR:  Regular rate and rhythm without murmurs, rubs, or gallops.  No jugular venous distention. LUNGS:  Clear to auscultation bilaterally. ABDOMEN:  Soft, nontender, nondistended. EXTREMITIES:  There is no clubbing, cyanosis, or edema. PULSES:  He has 2+ femoral bilaterally, 2+ radial, 2+ dorsalis pedis and posterior tibial bilaterally. NEURO:  Grossly afocal.  Moves all extremities.  Cranial nerves are intact. SKIN:  No rashes. LYMPH:  No lymphadenopathy.  LABORATORY DATA:  White count 16, hemoglobin 15, platelets 193, 79% neutrophils.  Basic metabolic panel is unremarkable.  His chest x-ray is clear.  His initial troponin is less than 0.05  and his CK-MB is 1.4. His EKG is nondiagnostic of ST elevation MI.  He does have anterior Q- waves with subtle less than 0.5 mm ST elevation seen throughout.  This is somewhat similar to previous EKGs.  ASSESSMENT AND PLAN:  Anthony Moon is a 45 year old white male with known coronary disease, previous anterior myocardial infarction who presents to the emergency room with chest pain.  Differential includes acute coronary syndrome versus pericarditis.  I discussed his subtle EKG findings with the interventionist on-call Anthony Moon.  Now that he is pain-free, we have elected to manage him medically overnight unless his clinical picture changes. 1. Chest pain:  Acute coronary syndrome Versus pericarditis.  Continue     aspirin and Effient and he has been started on heparin drip.  His     pain was relieved with morphine.  We will also start a nitro  drip     to better control his blood pressure and to try keep him pain free.     We will repeat an EKG with this pain returns and follow his cardiac     enzymes closely.  Also considering pericarditis given the nature of     his pain.  If his enzymes were completely negative, may consider     nonsteroidals.  We will increase his metoprolol to 25 mg every 6     hours.  Depending on how he does clinically, we will plan for     further risk stratification with left heart cath versus functional     study.  If he develops recurrent pain or EKG changes in the night,     we will on plan going to the cath lab. 2. Hyperlipidemia:  We will check fasting lipids in the morning and     continue his current statin.     Anthony Flor, MD     MMB/MEDQ  D:  12/23/2010  T:  12/24/2010  Job:  [id number]  cc:   Anthony Carrow, MD  Electronically Signed by Anthony Score MD on 01/12/2011 04:54:09 PM

## 2011-01-16 ENCOUNTER — Ambulatory Visit (INDEPENDENT_AMBULATORY_CARE_PROVIDER_SITE_OTHER): Payer: BC Managed Care – PPO | Admitting: Cardiovascular Disease

## 2011-01-16 ENCOUNTER — Encounter: Payer: Self-pay | Admitting: Cardiovascular Disease

## 2011-01-16 VITALS — BP 150/90 | HR 70 | Resp 18 | Ht 69.0 in | Wt 184.4 lb

## 2011-01-16 DIAGNOSIS — I10 Essential (primary) hypertension: Secondary | ICD-10-CM

## 2011-01-16 DIAGNOSIS — I2510 Atherosclerotic heart disease of native coronary artery without angina pectoris: Secondary | ICD-10-CM

## 2011-01-16 MED ORDER — ASPIRIN 81 MG PO TABS: 81.0000 mg | ORAL_TABLET | Freq: Every day | ORAL | Status: AC

## 2011-01-16 MED ORDER — LISINOPRIL 5 MG PO TABS: 10.0000 mg | ORAL_TABLET | Freq: Every day | ORAL | Status: DC

## 2011-01-16 NOTE — Progress Notes (Signed)
History of Present Illness:45 yo WM with history of tobacco abuse, HTN and CAD who was admitted to [facility] on May 18, 2010 with a NSTEMI and was found to have severe obstructive disease in the LAD. Two drug eluting stents were placed in the LAD. Mild hypokinesis of the anterolateral wall. He was admitted to [facility] on 12/23/10 with chest pain. Cardiac enzymes were negative. It was felt that this was secondary to pericarditis. He has done well since discharge.  He has been taking all of his medications. His activity level has been down. He has stopped smoking completely. 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 His energy level is good.    Past Medical History  Diagnosis Date  . CAD (coronary artery disease) 05/18/10    s/p cath 05/18/2010 with overlapping drug eluting stents LAD.  Marland Kitchen Tobacco abuse     stopped smoking  . Hypertension     Past Surgical History  Procedure Date  . Hernia repair     s/p double hernia repair  . Broken jaw repair     Current Outpatient Prescriptions  Medication Sig Dispense Refill  . acetaminophen (TYLENOL) 325 MG tablet Take 650 mg by mouth every 6 (six) hours as needed.        Marland Kitchen aspirin 325 MG tablet Take 325 mg by mouth daily.        . metoprolol tartrate (LOPRESSOR) 25 MG tablet Take 25 mg by mouth 2 (two) times daily.        . nitroGLYCERIN (NITROSTAT) 0.4 MG SL tablet Place 0.4 mg under the tongue every 5 (five) minutes as needed.        Marland Kitchen omeprazole (PRILOSEC) 20 MG capsule Take 20 mg by mouth daily.        . prasugrel (EFFIENT) 10 MG TABS Take 10 mg by mouth daily.        . simvastatin (ZOCOR) 40 MG tablet Take 40 mg by mouth at bedtime.        . hydrocodone-acetaminophen (LORCET-HD) 5-500 MG per capsule Take 1 capsule by mouth every 6 (six) hours as needed.        Marland Kitchen lisinopril (PRINIVIL,ZESTRIL) 5 MG tablet Take 5 mg by mouth daily.        Marland Kitchen DISCONTD: aspirin 81 MG  tablet Take 325 mg by mouth daily.         No Known Allergies  History   Social History  . Marital Status: Married    Spouse Name: N/A    Number of Children: 3  . Years of Education: N/A   Occupational History  . student     [employer]  . gas station attendant    Social History Main Topics  . Smoking status: Former Smoker -- 35 years  . Smokeless tobacco: Not on file  . Alcohol Use: Yes     socially  . Drug Use: Not on file  . Sexually Active: Not on file   Other Topics Concern  . Not on file   Social History Narrative  . No narrative on file    Family History  Problem Relation Age of Onset  . Coronary artery disease Father     Review of Systems:  One recent episode of CP. No SOB, palpitations, dizziness,  near syncope or syncope.  No PND, orthopnea, or Lower extremity edema.   BP 150/90  Resp 18  Ht 5'  9" (1.753 m)  Wt 184 lb 6.4 oz (83.643 kg)  BMI 27.23 kg/m2  Physical Examination: General: Well developed, well nourished, NAD HEENT: OP clear, mucus membranes moist SKIN: warm, dry. No rashes. Neuro: No focal deficits Musculoskeletal: Muscle strength 5/5 all ext Psychiatric: Mood and affect normal Neck: No JVD, no carotid bruits, no thyromegaly, no lymphadenopathy. Lungs:Clear bilaterally, no wheezes, rhonci, crackles Cardiovascular: Regular rate and rhythm. No murmurs, gallops or rubs. Abdomen:Soft. Bowel sounds present. Non-tender.  Extremities: No lower extremity edema. Pulses are 2 + in the bilateral DP/PT.  EKG: NSR, rate 72 bpm. Repolarization abnormality.   Echo 12/24/10: Left ventricle: The cavity size was normal. Wall thickness was     normal. Systolic function was normal. The estimated ejection     fraction was in the range of 50% to 55%. There is mild hypokinesis     of the inferoseptal and apical myocardium. Doppler parameters are     consistent with abnormal left ventricular relaxation (grade 1     diastolic dysfunction).   - Tricuspid valve:  Trivial regurgitation.   - Pericardium, extracardiac: There was no pericardial effusion.

## 2011-01-16 NOTE — Assessment & Plan Note (Signed)
Stable. NO changes. Will continue dual antiplatelet therapy with ASA 81 mg po Qdaily and Effient 10 mg po Qdaily. Continue beta blocker and statin.

## 2011-01-16 NOTE — Patient Instructions (Signed)
Your physician recommends that you schedule a follow-up appointment in: 6 months with Dr. Clifton James Your physician has recommended you make the following change in your medication: INCREASE LISINOPRIL to 10mg  by mouth daily and DECREASE ASPIRIN to 81mg  by mouth daily.

## 2011-01-16 NOTE — Assessment & Plan Note (Signed)
Elevated today. Will increase Lisinopril to 10 mg po Qdaily.

## 2011-02-01 NOTE — Discharge Summary (Signed)
Anthony Moon, Anthony Moon                  ACCOUNT NO.:  [id number]  MEDICAL RECORD NO.:  [id number]           PATIENT TYPE:  I  LOCATION:  2508                         FACILITY:  [facility]  PHYSICIAN:  Jesse Sans. Darianny Momon, MD, FACCDATE OF BIRTH:  1966/03/31  DATE OF ADMISSION:  12/23/2010 DATE OF DISCHARGE:  12/25/2010                              DISCHARGE SUMMARY   PRIMARY CARDIOLOGIST:  Verne Carrow, MD  DISCHARGE DIAGNOSIS:  Acute pericarditis.  SECONDARY DIAGNOSES: 1. Coronary artery disease, status post non-ST-elevation myocardial     infarction in August 2011 with drug-eluting stent placement x2 to     the left anterior descending. 2. Mild nonischemic cardiomyopathy, ejection fraction 45-50% at the     time of myocardial infarction. 3. Tobacco abuse, quitting in August 2011. 4. Hypertension. 5. Hyperlipidemia. 6. Prior inguinal hernia repair.  ALLERGIES:  No known drug allergies.  PROCEDURES:  Two-D echocardiogram, the result is currently pending.  HISTORY OF PRESENT ILLNESS:  This is a 45 year old male with prior history of coronary artery disease, status post non-ST-elevation MI in August 2011 with drug-eluting stent placed to the LAD x2.  The patient has been doing well and was in his usual state of health until approximately 2:30 p.m. on December 23, 2010, when he had sudden onset of sharp intermittent chest pain that was subsequently worse with lying flat and better with sitting up.  There were no associated symptoms. Pain was different from prior angina.  The patient presented to [facility] ED where he had no significant relief with nitroglycerin, but nearly complete relief with morphine.  EKG showed anterior Qs with subtle less than 0.5-mm diffuse ST-segment elevation.  His point-of-care markers were negative.  The patient was admitted for further evaluation with presumptive diagnosis of acute pericarditis.  HOSPITAL COURSE:  The patient's cardiac enzymes remained  negative. Stabbing chest pain lessened, but was still present to some degree.  He was placed on NSAID therapy as well as proton pump inhibitor with significant improvement in his chest discomfort.  A 2-D echocardiogram was performed on December 24, 2010, and at this time that result is pending.  Provided that echo shows no acute abnormalities, the patient will be discharged home today.  DISCHARGE LABORATORY DATA:  Hemoglobin 15.5, hematocrit 43.5, WBC 15.9, and platelets 193.  Sodium 135, potassium 3.6, chloride 103, CO2 of 25, BUN 15, creatinine 0.94, glucose 93, total bilirubin 0.9, alkaline phosphatase 51, AST 23, ALT 46, total protein 6.8, albumin 4.0, calcium 9.0.  CK 110, MB 1.9, and troponin I of 0.01.  Total cholesterol 150, triglycerides 72, HDL 61, and LDL 75.  MRSA screen was negative.  DISPOSITION:  The patient will be discharged home today in good condition, pending echo.  FOLLOWUP PLANS AND APPOINTMENTS:  The patient will follow up with Dr. Clifton James in approximately 2 weeks.  DISCHARGE MEDICATIONS: 1. Naproxen 500 mg b.i.d. x10 days. 2. Protonix 40 mg daily x30 days. 3. Aspirin 81 mg daily. 4. Nitroglycerin 0.4 mg sublingual p.r.n. chest pain. 5. Acetaminophen 325 mg 2 tablets daily p.r.n. 6. Calcium carbonate/Tums 1-2 tablets p.r.n. 7. Effient 10  mg daily. 8. Lisinopril 5 mg daily. 9. Metoprolol tartrate 25 mg b.i.d. 10.Simvastatin 40 mg q.p.m.  OUTSTANDING LABORATORY DATA AND STUDIES:  Two-D echocardiogram is currently pending.  DURATION OF DISCHARGE ENCOUNTER:  40 minutes including physician time.     Nicolasa Ducking, ANP   ______________________________ Jesse Sans. Daleen Squibb, MD, [facility]    CB/MEDQ  D:  12/25/2010  T:  12/26/2010  Job:  [id number]  Electronically Signed by Nicolasa Ducking ANP on 01/24/2011 04:03:19 PM Electronically Signed by Valera Castle MD FACC on 02/01/2011 08:42:08 AM

## 2011-04-17 ENCOUNTER — Telehealth: Payer: Self-pay | Admitting: Cardiovascular Disease

## 2011-04-18 ENCOUNTER — Other Ambulatory Visit: Payer: Self-pay | Admitting: *Deleted

## 2011-04-18 MED ORDER — METOPROLOL TARTRATE 25 MG PO TABS: 25.0000 mg | ORAL_TABLET | Freq: Two times a day (BID) | ORAL | Status: DC

## 2011-04-18 MED ORDER — SIMVASTATIN 40 MG PO TABS: 40.0000 mg | ORAL_TABLET | Freq: Every day | ORAL | Status: DC

## 2011-05-18 ENCOUNTER — Telehealth: Payer: Self-pay | Admitting: Cardiovascular Disease

## 2011-05-18 NOTE — Telephone Encounter (Signed)
Pt wife called regarding Effient and samples.  Do you have any she can pick up?  [phone] her cell number

## 2011-05-18 NOTE — Telephone Encounter (Signed)
Will leave samples at the front desk.

## 2011-07-03 ENCOUNTER — Telehealth: Payer: Self-pay | Admitting: Cardiovascular Disease

## 2011-07-03 NOTE — Telephone Encounter (Signed)
Spoke with wife and told her I would leave 14 tablets of Effient at front desk for pt to pick up.  --14 tabs. Exp. 04/2012, Lot [id number] left at desk.

## 2011-07-03 NOTE — Telephone Encounter (Signed)
Pt wants samples effient please

## 2011-07-17 ENCOUNTER — Ambulatory Visit (INDEPENDENT_AMBULATORY_CARE_PROVIDER_SITE_OTHER): Payer: BC Managed Care – PPO | Admitting: Cardiovascular Disease

## 2011-07-17 ENCOUNTER — Encounter: Payer: Self-pay | Admitting: Cardiovascular Disease

## 2011-07-17 VITALS — BP 145/94 | HR 65 | Ht 69.0 in | Wt 188.1 lb

## 2011-07-17 DIAGNOSIS — I2510 Atherosclerotic heart disease of native coronary artery without angina pectoris: Secondary | ICD-10-CM

## 2011-07-17 MED ORDER — CLOPIDOGREL BISULFATE 75 MG PO TABS: 75.0000 mg | ORAL_TABLET | Freq: Every day | ORAL | Status: DC

## 2011-07-17 NOTE — Progress Notes (Signed)
History of Present Illness:45 yo WM with history of tobacco abuse, HTN and CAD who was admitted to [facility] on May 18, 2010 with a NSTEMI and was found to have severe obstructive disease in the LAD. Two drug eluting stents were placed in the LAD. Mild hypokinesis of the anterolateral wall. He was admitted to [facility] on 12/23/10 with chest pain. Cardiac enzymes were negative. It was felt that this was secondary to pericarditis. He has done well since discharge. He has been taking all of his medications.  He has stopped smoking completely. He is working in the [employer]. No chest pain, SOB, palpitations. Overall feeling well. He has gained weight. He is wanting to start running.    Past Medical History  Diagnosis Date  . CAD (coronary artery disease) 05/18/10    s/p cath 05/18/2010 with overlapping drug eluting stents LAD.  Anthony Moon Tobacco abuse     stopped smoking  . Hypertension     Past Surgical History  Procedure Date  . Hernia repair     s/p double hernia repair  . Broken jaw repair     Current Outpatient Prescriptions  Medication Sig Dispense Refill  . acetaminophen (TYLENOL) 325 MG tablet Take 650 mg by mouth every 6 (six) hours as needed.        Anthony Moon aspirin 81 MG tablet Take 1 tablet (81 mg total) by mouth daily.  30 tablet    . hydrocodone-acetaminophen (LORCET-HD) 5-500 MG per capsule Take 1 capsule by mouth every 6 (six) hours as needed.        Anthony Moon lisinopril (PRINIVIL,ZESTRIL) 5 MG tablet Take 2 tablets (10 mg total) by mouth daily.  30 tablet  8  . metoprolol tartrate (LOPRESSOR) 25 MG tablet Take 1 tablet (25 mg total) by mouth 2 (two) times daily.  60 tablet  6  . nitroGLYCERIN (NITROSTAT) 0.4 MG SL tablet Place 0.4 mg under the tongue every 5 (five) minutes as needed.        Anthony Moon omeprazole (PRILOSEC) 20 MG capsule Take 20 mg by mouth daily.        . prasugrel (EFFIENT) 10 MG TABS Take 10 mg by mouth daily.        . simvastatin (ZOCOR) 40 MG tablet  Take 1 tablet (40 mg total) by mouth at bedtime.  30 tablet  6    No Known Allergies  History   Social History  . Marital Status: Married    Spouse Name: N/A    Number of Children: 3  . Years of Education: N/A   Occupational History  . student     [employer]  . gas station attendant    Social History Main Topics  . Smoking status: Former Smoker -- 35 years  . Smokeless tobacco: Not on file  . Alcohol Use: Yes     socially  . Drug Use: Not on file  . Sexually Active: Not on file   Other Topics Concern  . Not on file   Social History Narrative  . No narrative on file    Family History  Problem Relation Age of Onset  . Coronary artery disease Father     Review of Systems:  As stated in the HPI and otherwise negative.   BP 145/94  Pulse 65  Ht 5\' 9"  (1.753 m)  Wt 188 lb 1.9 oz (85.331 kg)  BMI 27.78 kg/m2  Physical Examination: General: Well developed, well nourished, NAD HEENT: OP clear, mucus membranes  moist SKIN: warm, dry. No rashes. Neuro: No focal deficits Musculoskeletal: Muscle strength 5/5 all ext Psychiatric: Mood and affect normal Neck: No JVD, no carotid bruits, no thyromegaly, no lymphadenopathy. Lungs:Clear bilaterally, no wheezes, rhonci, crackles Cardiovascular: Regular rate and rhythm. No murmurs, gallops or rubs. Abdomen:Soft. Bowel sounds present. Non-tender.  Extremities: No lower extremity edema. Pulses are 2 + in the bilateral DP/PT.  EKG:NSR, rate 66 bpm.

## 2011-07-17 NOTE — Patient Instructions (Addendum)
Your physician wants you to follow-up in 12 months. You will receive a reminder letter in the mail two months in advance. If you don't receive a letter, please call our office to schedule the follow-up appointment.  Your physician has recommended you make the following change in your medication: Stop Effient. Start Plavix (clopidogrel) 75 mg by mouth daily.

## 2011-07-17 NOTE — Progress Notes (Signed)
Addended by: Erin Hearing on: 07/17/2011 04:39 PM   Modules accepted: Orders

## 2011-07-17 NOTE — Assessment & Plan Note (Signed)
Stable. Continue current therapy. Will change Effient to Plavix. He has overlapping drug eluting stents in the LAD and presented last year with a NSTEMI. He is given a copy of his EKG as it is grossly abnormal at baseline with ST elevation c/w repolarization.

## 2011-07-19 NOTE — Progress Notes (Signed)
Addended by: Erin Hearing on: 07/19/2011 10:21 AM   Modules accepted: Orders

## 2011-10-12 ENCOUNTER — Other Ambulatory Visit: Payer: Self-pay

## 2011-10-12 DIAGNOSIS — I2510 Atherosclerotic heart disease of native coronary artery without angina pectoris: Secondary | ICD-10-CM

## 2011-10-12 MED ORDER — LISINOPRIL 5 MG PO TABS: 10.0000 mg | ORAL_TABLET | Freq: Every day | ORAL | Status: DC

## 2011-11-14 ENCOUNTER — Other Ambulatory Visit: Payer: Self-pay

## 2011-11-14 MED ORDER — SIMVASTATIN 40 MG PO TABS: 40.0000 mg | ORAL_TABLET | Freq: Every day | ORAL | Status: DC

## 2011-11-14 MED ORDER — METOPROLOL TARTRATE 25 MG PO TABS: 25.0000 mg | ORAL_TABLET | Freq: Two times a day (BID) | ORAL | Status: DC

## 2012-02-14 ENCOUNTER — Other Ambulatory Visit: Payer: Self-pay | Admitting: Cardiovascular Disease

## 2012-02-14 DIAGNOSIS — I2510 Atherosclerotic heart disease of native coronary artery without angina pectoris: Secondary | ICD-10-CM

## 2012-02-14 MED ORDER — LISINOPRIL 5 MG PO TABS: 10.0000 mg | ORAL_TABLET | Freq: Every day | ORAL | Status: DC

## 2012-03-06 ENCOUNTER — Other Ambulatory Visit: Payer: Self-pay | Admitting: Cardiovascular Disease

## 2012-03-06 DIAGNOSIS — I2510 Atherosclerotic heart disease of native coronary artery without angina pectoris: Secondary | ICD-10-CM

## 2012-03-06 MED ORDER — METOPROLOL TARTRATE 25 MG PO TABS: 25.0000 mg | ORAL_TABLET | Freq: Two times a day (BID) | ORAL | Status: DC

## 2012-03-06 MED ORDER — CLOPIDOGREL BISULFATE 75 MG PO TABS: 75.0000 mg | ORAL_TABLET | Freq: Every day | ORAL | Status: DC

## 2012-03-06 MED ORDER — LISINOPRIL 5 MG PO TABS: 10.0000 mg | ORAL_TABLET | Freq: Every day | ORAL | Status: DC

## 2012-03-06 MED ORDER — SIMVASTATIN 40 MG PO TABS: 40.0000 mg | ORAL_TABLET | Freq: Every day | ORAL | Status: DC

## 2012-08-21 ENCOUNTER — Other Ambulatory Visit: Payer: Self-pay | Admitting: Cardiovascular Disease

## 2012-08-21 DIAGNOSIS — I2510 Atherosclerotic heart disease of native coronary artery without angina pectoris: Secondary | ICD-10-CM

## 2012-08-21 MED ORDER — CLOPIDOGREL BISULFATE 75 MG PO TABS: 75.0000 mg | ORAL_TABLET | Freq: Every day | ORAL | Status: DC

## 2012-08-21 NOTE — Telephone Encounter (Signed)
Fax Received. Refill Completed. Chailyn Racette Chowoe (R.M.A)   

## 2012-09-16 ENCOUNTER — Telehealth: Payer: Self-pay | Admitting: Cardiovascular Disease

## 2012-09-16 DIAGNOSIS — I2510 Atherosclerotic heart disease of native coronary artery without angina pectoris: Secondary | ICD-10-CM

## 2012-09-16 MED ORDER — METOPROLOL TARTRATE 25 MG PO TABS: 25.0000 mg | ORAL_TABLET | Freq: Two times a day (BID) | ORAL | Status: DC

## 2012-09-16 MED ORDER — LISINOPRIL 5 MG PO TABS: 10.0000 mg | ORAL_TABLET | Freq: Every day | ORAL | Status: DC

## 2012-09-16 MED ORDER — SIMVASTATIN 40 MG PO TABS: 40.0000 mg | ORAL_TABLET | Freq: Every day | ORAL | Status: DC

## 2012-09-16 NOTE — Telephone Encounter (Signed)
Pt needs refill of lisinopril, metoprolol, simvastatin to [facility]

## 2012-09-24 ENCOUNTER — Encounter: Payer: Self-pay | Admitting: Cardiovascular Disease

## 2012-09-24 ENCOUNTER — Ambulatory Visit (INDEPENDENT_AMBULATORY_CARE_PROVIDER_SITE_OTHER): Payer: BC Managed Care – PPO | Admitting: Cardiovascular Disease

## 2012-09-24 VITALS — BP 146/73 | HR 68 | Ht 69.0 in | Wt 164.0 lb

## 2012-09-24 DIAGNOSIS — I2510 Atherosclerotic heart disease of native coronary artery without angina pectoris: Secondary | ICD-10-CM

## 2012-09-24 LAB — HEPATIC FUNCTION PANEL
ALT: 27 U/L (ref 0–53)
AST: 21 U/L (ref 0–37)
Albumin: 4.2 g/dL (ref 3.5–5.2)

## 2012-09-24 LAB — LIPID PANEL
Cholesterol: 165 mg/dL (ref 0–200)
HDL: 79.1 mg/dL (ref >39.00)
Triglycerides: 54 mg/dL (ref 0.0–149.0)

## 2012-09-24 MED ORDER — LOSARTAN POTASSIUM 50 MG PO TABS: 50.0000 mg | ORAL_TABLET | Freq: Every day | ORAL | Status: DC

## 2012-09-24 NOTE — Progress Notes (Signed)
History of Present Illness: 46 yo WM with history of tobacco abuse, HTN and CAD who was admitted to [facility] on May 18, 2010 with a NSTEMI and was found to have severe obstructive disease in the LAD. Two drug eluting stents were placed in the LAD. Mild hypokinesis of the anterolateral wall. He was admitted to [facility] on 12/23/10 with chest pain. Cardiac enzymes were negative. It was felt that this was secondary to pericarditis. He has done well since then.   He is here today for follow up. He has been taking all of his medications. He has stopped smoking completely. He is working in the [employer]. No chest pain, SOB, palpitations. Overall feeling well.   Primary Care Physician: None  Last Lipid Profile:Lipid Panel     Component Value Date/Time   CHOL  Value: 150        ATP III CLASSIFICATION:  <200     mg/dL   Desirable  010-272  mg/dL   Borderline High  >=536    mg/dL   High        6/44/0347 0340   TRIG 72 12/24/2010 0340   HDL 61 12/24/2010 0340   CHOLHDL 2.5 12/24/2010 0340   VLDL 14 12/24/2010 0340   LDLCALC  Value: 75        Total Cholesterol/HDL:CHD Risk Coronary Heart Disease Risk Table                     Men   Women  1/2 Average Risk   3.4   3.3  Average Risk       5.0   4.4  2 X Average Risk   9.6   7.1  3 X Average Risk  23.4   11.0        Use the calculated Patient Ratio above and the CHD Risk Table to determine the patient's CHD Risk.        ATP III CLASSIFICATION (LDL):  <100     mg/dL   Optimal  425-956  mg/dL   Near or Above                    Optimal  130-159  mg/dL   Borderline  387-564  mg/dL   High  >332     mg/dL   Very High 9/51/8841 6606     Past Medical History  Diagnosis Date  . CAD (coronary artery disease) 05/18/10    s/p cath 05/18/2010 with overlapping drug eluting stents LAD.  Marland Kitchen Tobacco abuse     stopped smoking  . Hypertension     Past Surgical History  Procedure Date  . Hernia repair     s/p double hernia repair  .  Broken jaw repair     Current Outpatient Prescriptions  Medication Sig Dispense Refill  . acetaminophen (TYLENOL) 325 MG tablet Take 650 mg by mouth every 6 (six) hours as needed.        Marland Kitchen aspirin 81 MG tablet Take 1 tablet (81 mg total) by mouth daily.  30 tablet    . clopidogrel (PLAVIX) 75 MG tablet Take 1 tablet (75 mg total) by mouth daily.  90 tablet  0  . lisinopril (PRINIVIL,ZESTRIL) 5 MG tablet Take 2 tablets (10 mg total) by mouth daily.  180 tablet  2  . metoprolol tartrate (LOPRESSOR) 25 MG tablet Take 1 tablet (25 mg total) by mouth 2 (two) times daily.  180 tablet  1  . nitroGLYCERIN (NITROSTAT) 0.4 MG SL tablet Place 0.4 mg under the tongue every 5 (five) minutes as needed.        . simvastatin (ZOCOR) 40 MG tablet Take 1 tablet (40 mg total) by mouth at bedtime.  90 tablet  1    No Known Allergies  History   Social History  . Marital Status: Married    Spouse Name: N/A    Number of Children: 3  . Years of Education: N/A   Occupational History  . student     [employer]  . gas station attendant    Social History Main Topics  . Smoking status: Former Smoker -- 35 years  . Smokeless tobacco: Not on file  . Alcohol Use: Yes     Comment: socially  . Drug Use: Not on file  . Sexually Active: Not on file   Other Topics Concern  . Not on file   Social History Narrative  . No narrative on file    Family History  Problem Relation Age of Onset  . Coronary artery disease Father     Review of Systems:  As stated in the HPI and otherwise negative.   BP 146/73  Pulse 68  Ht 5\' 9"  (1.753 m)  Wt 164 lb (74.39 kg)  BMI 24.22 kg/m2  Physical Examination: General: Well developed, well nourished, NAD HEENT: OP clear, mucus membranes moist SKIN: warm, dry. No rashes. Neuro: No focal deficits Musculoskeletal: Muscle strength 5/5 all ext Psychiatric: Mood and affect normal Neck: No JVD, no carotid bruits, no thyromegaly, no lymphadenopathy. Lungs:Clear bilaterally,  no wheezes, rhonci, crackles Cardiovascular: Regular rate and rhythm. No murmurs, gallops or rubs. Abdomen:Soft. Bowel sounds present. Non-tender.  Extremities: No lower extremity edema. Pulses are 2 + in the bilateral DP/PT.  EKG: NSR, rate 71 bpm. LVH. He is known to have large anterior ST elevation c/w repolarization.   Assessment and Plan:   1. CAD: Stable. Continue current therapy. Continue ASA and Plavix. He has overlapping drug eluting stents in the LAD. Will continue statin. Will check lipids and LFTs over next few weeks. Will stop Lisinopril with facial rash and start Cozaar 50 mg po Qdaily.

## 2012-09-24 NOTE — Patient Instructions (Addendum)
Your physician wants you to follow-up in:  12 months.  You will receive a reminder letter in the mail two months in advance. If you don't receive a letter, please call our office to schedule the follow-up appointment.  Your physician has recommended you make the following change in your medication:  Stop Lisinopril. Start Cozaar 50 mg by mouth daily  We will call you with results of lab work done today

## 2012-12-02 ENCOUNTER — Telehealth: Payer: Self-pay | Admitting: Cardiovascular Disease

## 2012-12-02 DIAGNOSIS — I2510 Atherosclerotic heart disease of native coronary artery without angina pectoris: Secondary | ICD-10-CM

## 2012-12-02 NOTE — Telephone Encounter (Signed)
Per refill line  No information on what med needed refilled just that it needs to go to [facility]

## 2012-12-03 MED ORDER — SIMVASTATIN 40 MG PO TABS: 40.0000 mg | ORAL_TABLET | Freq: Every day | ORAL | Status: DC

## 2012-12-03 MED ORDER — CLOPIDOGREL BISULFATE 75 MG PO TABS: 75.0000 mg | ORAL_TABLET | Freq: Every day | ORAL | Status: DC

## 2012-12-03 MED ORDER — NITROGLYCERIN 0.4 MG SL SUBL: 0.4000 mg | SUBLINGUAL_TABLET | SUBLINGUAL | Status: DC | PRN

## 2012-12-03 MED ORDER — LOSARTAN POTASSIUM 50 MG PO TABS: 50.0000 mg | ORAL_TABLET | Freq: Every day | ORAL | Status: DC

## 2012-12-03 MED ORDER — METOPROLOL TARTRATE 25 MG PO TABS: 25.0000 mg | ORAL_TABLET | Freq: Two times a day (BID) | ORAL | Status: DC

## 2012-12-03 NOTE — Telephone Encounter (Signed)
Reviewed last ov/ refilled for 1 year

## 2012-12-05 ENCOUNTER — Telehealth: Payer: Self-pay | Admitting: Cardiovascular Disease

## 2012-12-05 DIAGNOSIS — I2510 Atherosclerotic heart disease of native coronary artery without angina pectoris: Secondary | ICD-10-CM

## 2012-12-05 MED ORDER — LOSARTAN POTASSIUM 100 MG PO TABS: 100.0000 mg | ORAL_TABLET | Freq: Every day | ORAL | Status: DC

## 2012-12-05 NOTE — Telephone Encounter (Signed)
New Problem   Pt states he has been battling with his high blood pressure for the last couple days. Would like to speak to nurse.

## 2012-12-05 NOTE — Telephone Encounter (Signed)
Spoke with pt and gave him instructions from Dr. Clifton James. He will monitor blood pressure and let us know if it remains elevated after increasing Cozaar. Will send new prescription to [facility]

## 2012-12-05 NOTE — Telephone Encounter (Signed)
We can increase his Cozaar to 100 mg po Qdaily. cdm

## 2012-12-05 NOTE — Telephone Encounter (Signed)
Spoke with pt. He reports he checks his blood pressure at [employer]. It has been elevated for last week prior to work and after he gets off work. Other times it is OK.  Also feels blood pressure is elevated at work because he is able to feel facial flushing and chest tightness.  No chest pain. States work is extremely stressful.  Prior to this week he checked blood pressure periodically and it ran 145-149/70.  This week it has been running 172-176/99-102 before and after work.  Today he left work early because he felt blood pressure was still elevated. He went to [facility] and checked blood pressure after 30 minutes and it was 149/79.  He is concerned about elevated blood pressure and stress.  He does not have a primary care MD--usually goes to Urgent Care.  Is taking Cozaar and Lopressor as listed.

## 2012-12-09 ENCOUNTER — Ambulatory Visit (INDEPENDENT_AMBULATORY_CARE_PROVIDER_SITE_OTHER): Payer: BC Managed Care – PPO | Admitting: Internal Medicine

## 2012-12-09 ENCOUNTER — Encounter: Payer: Self-pay | Admitting: Internal Medicine

## 2012-12-09 VITALS — BP 150/90 | Temp 98.0°F | Ht 70.0 in | Wt 170.0 lb

## 2012-12-09 DIAGNOSIS — F4322 Adjustment disorder with anxiety: Secondary | ICD-10-CM

## 2012-12-09 DIAGNOSIS — I2510 Atherosclerotic heart disease of native coronary artery without angina pectoris: Secondary | ICD-10-CM

## 2012-12-09 DIAGNOSIS — I10 Essential (primary) hypertension: Secondary | ICD-10-CM

## 2012-12-09 MED ORDER — SERTRALINE HCL 25 MG PO TABS: ORAL_TABLET | ORAL | Status: DC

## 2012-12-09 NOTE — Assessment & Plan Note (Signed)
Patient having difficulty managing stress levels at work. Start low dose sertraline 25 mg once daily. He discussed potential side effects. Reassess in 4 weeks.

## 2012-12-09 NOTE — Assessment & Plan Note (Signed)
Followed closely by Dr. Clifton James. I stressed importance of tobacco cessation.

## 2012-12-09 NOTE — Progress Notes (Signed)
Subjective:    Patient ID: Anthony Moon, male    DOB: 07-Feb-1966, 47 y.o.   MRN: [id number]  HPI  47 year old white male with history of coronary artery disease, hypertension and tobacco use to establish. Patient suffered NST EMI on 05/18/2010. Patient found to have severe objective disease in LAD. 2 a loading stent replaced in LAD. Patient's cardiac  status is stable. He is maintained on aspirin and Plavix.  His cardiologist recently discontinued lisinopril. Patient reports facial redness after starting medication. Unclear whether facial rash due to side effect of medication or other cause. Patient recently changed to losartan. Patient reports minimal improvement in facial redness.  He denies any lip or tongue swelling.  Patient also has history of adjustment disorder with anxiety. Patient reports he has very stressful job. Patient has difficulty sleeping at night thinking about work. "I feel like I'm on call everyday".  He works as Production designer, theatre/television/film for [employer].    Review of Systems  Constitutional: Negative for activity change, appetite change. Mild weight loss after he stopped drinking soft drinks  Eyes: Negative for visual disturbance.  Respiratory: Negative for cough, chest tightness and shortness of breath.   Cardiovascular: Negative for chest pain.  Genitourinary: Negative for difficulty urinating.  Neurological: Negative for headaches.  Gastrointestinal: Negative for abdominal pain, heartburn melena or hematochezia Psych: Negative for depression Endo:  Negative for sexual dysfunction  Past Medical History  Diagnosis Date  . CAD (coronary artery disease) 05/18/10    s/p cath 05/18/2010 with overlapping drug eluting stents LAD.  Marland Kitchen Tobacco abuse     stopped smoking  . Hypertension     History   Social History  . Marital Status: Married    Spouse Name: N/A    Number of Children: 3  . Years of Education: N/A   Occupational History  . student     [employer]  . gas station  attendant    Social History Main Topics  . Smoking status: Current Some Day Smoker -- 35 years  . Smokeless tobacco: Not on file  . Alcohol Use: Yes     Comment: socially  . Drug Use: Not on file  . Sexually Active: Not on file   Other Topics Concern  . Not on file   Social History Narrative  . No narrative on file    Past Surgical History  Procedure Laterality Date  . Hernia repair      s/p double hernia repair  . Broken jaw repair      Family History  Problem Relation Age of Onset  . Coronary artery disease Father     No Known Allergies  Current Outpatient Prescriptions on File Prior to Visit  Medication Sig Dispense Refill  . acetaminophen (TYLENOL) 325 MG tablet Take 650 mg by mouth every 6 (six) hours as needed.        Marland Kitchen aspirin 81 MG tablet Take 1 tablet (81 mg total) by mouth daily.  30 tablet    . clopidogrel (PLAVIX) 75 MG tablet Take 1 tablet (75 mg total) by mouth daily.  90 tablet  3  . metoprolol tartrate (LOPRESSOR) 25 MG tablet Take 1 tablet (25 mg total) by mouth 2 (two) times daily.  180 tablet  3  . nitroGLYCERIN (NITROSTAT) 0.4 MG SL tablet Place 1 tablet (0.4 mg total) under the tongue every 5 (five) minutes as needed.  25 tablet  11  . simvastatin (ZOCOR) 40 MG tablet Take 1 tablet (40 mg total) by  mouth at bedtime.  90 tablet  3   No current facility-administered medications on file prior to visit.    BP 150/90  Temp(Src) 98 F (36.7 C) (Oral)  Ht 5\' 10"  (1.778 m)  Wt 170 lb (77.111 kg)  BMI 24.39 kg/m2          Objective:   Physical Exam  Constitutional: He is oriented to person, place, and time. He appears well-developed and well-nourished.  HENT:  Head: Normocephalic and atraumatic.  Right Ear: External ear normal.  Left Ear: External ear normal.  Mouth/Throat: Oropharynx is clear and moist.  Eyes: EOM are normal. Pupils are equal, round, and reactive to light.  Neck: Neck supple.  No carotid bruit  Cardiovascular: Normal rate,  regular rhythm and normal heart sounds.   No murmur heard. Pulmonary/Chest: Effort normal and breath sounds normal. He has no wheezes.  Abdominal: Soft. Bowel sounds are normal. He exhibits no mass.  Musculoskeletal: He exhibits no edema.  Lymphadenopathy:    He has no cervical adenopathy.  Neurological: He is alert and oriented to person, place, and time. No cranial nerve deficit.  Skin: Skin is warm and dry.  Psychiatric: He has a normal mood and affect. His behavior is normal.          Assessment & Plan:

## 2012-12-09 NOTE — Assessment & Plan Note (Signed)
Patient developed facial redness/rash. His symptoms thought to be secondary to ACE inhibitor. Lisinopril switched to Losartan which was recently increased to 100 mg by his cardiologist. Patient advised to monitor blood pressure at home. If persistent facial redness, we discussed empiric trial of MetroGel for rosacea versus dermatology referral.

## 2013-01-27 ENCOUNTER — Telehealth: Payer: Self-pay | Admitting: Cardiovascular Disease

## 2013-01-27 DIAGNOSIS — I10 Essential (primary) hypertension: Secondary | ICD-10-CM

## 2013-01-27 MED ORDER — LOSARTAN POTASSIUM 100 MG PO TABS: 100.0000 mg | ORAL_TABLET | Freq: Every day | ORAL | Status: DC

## 2013-01-27 NOTE — Telephone Encounter (Signed)
Spoke with pt's wife. Pt's blood pressure has improved with increased dose of Losartan. Pt's wife checked bottle and confirms he has been taking 2 of the 50 mg tablets daily. Pt needs prescription for 100 mg tablets sent to [facility]. Will send today.

## 2013-01-27 NOTE — Telephone Encounter (Signed)
New Prob    Pt is calling in requesting a dosage change for LOSARTAN. Would like to speak to nurse.

## 2013-01-28 ENCOUNTER — Telehealth: Payer: Self-pay | Admitting: Cardiovascular Disease

## 2013-01-28 NOTE — Telephone Encounter (Signed)
Spoke with pt's wife who thinks voicemail was an old message. I spoke with her yesterday and she has no questions at this time.

## 2013-01-28 NOTE — Telephone Encounter (Signed)
New Problem:    Called in returning a message that was left on her voicemail.  Please call back.

## 2013-03-14 ENCOUNTER — Other Ambulatory Visit: Payer: Self-pay | Admitting: *Deleted

## 2013-03-14 MED ORDER — SIMVASTATIN 40 MG PO TABS: 40.0000 mg | ORAL_TABLET | Freq: Every day | ORAL | Status: DC

## 2013-03-17 ENCOUNTER — Other Ambulatory Visit: Payer: Self-pay | Admitting: *Deleted

## 2013-03-17 MED ORDER — SIMVASTATIN 40 MG PO TABS: 40.0000 mg | ORAL_TABLET | Freq: Every day | ORAL | Status: DC

## 2013-03-17 MED ORDER — METOPROLOL TARTRATE 25 MG PO TABS: 25.0000 mg | ORAL_TABLET | Freq: Two times a day (BID) | ORAL | Status: DC

## 2013-03-17 NOTE — Telephone Encounter (Signed)
Fax Received. Refill Completed. Lynell Greenhouse Chowoe (R.M.A)   

## 2013-03-25 ENCOUNTER — Other Ambulatory Visit: Payer: Self-pay | Admitting: *Deleted

## 2013-03-25 MED ORDER — METOPROLOL TARTRATE 25 MG PO TABS: 25.0000 mg | ORAL_TABLET | Freq: Two times a day (BID) | ORAL | Status: DC

## 2013-06-18 ENCOUNTER — Other Ambulatory Visit: Payer: Self-pay

## 2013-06-18 DIAGNOSIS — I2510 Atherosclerotic heart disease of native coronary artery without angina pectoris: Secondary | ICD-10-CM

## 2013-06-18 MED ORDER — CLOPIDOGREL BISULFATE 75 MG PO TABS: 75.0000 mg | ORAL_TABLET | Freq: Every day | ORAL | Status: DC

## 2013-09-17 ENCOUNTER — Ambulatory Visit (INDEPENDENT_AMBULATORY_CARE_PROVIDER_SITE_OTHER): Payer: Commercial Indemnity | Admitting: Cardiovascular Disease

## 2013-09-17 ENCOUNTER — Encounter: Payer: Self-pay | Admitting: Cardiovascular Disease

## 2013-09-17 VITALS — BP 159/91 | HR 92 | Ht 70.0 in | Wt 169.0 lb

## 2013-09-17 DIAGNOSIS — I10 Essential (primary) hypertension: Secondary | ICD-10-CM

## 2013-09-17 DIAGNOSIS — I2510 Atherosclerotic heart disease of native coronary artery without angina pectoris: Secondary | ICD-10-CM

## 2013-09-17 MED ORDER — METOPROLOL TARTRATE 50 MG PO TABS: 50.0000 mg | ORAL_TABLET | Freq: Two times a day (BID) | ORAL | Status: DC

## 2013-09-17 NOTE — Progress Notes (Signed)
History of Present Illness: 47 yo WM with history of tobacco abuse, HTN and CAD who was admitted to [facility] on May 18, 2010 with a NSTEMI and was found to have severe obstructive disease in the LAD. Two drug eluting stents were placed in the LAD. Mild hypokinesis of the anterolateral wall.   He is here today for follow up. He has been taking all of his medications. He has stopped smoking completely. He is working at [employer]. No chest pain, SOB, palpitations. Overall feeling well.   Primary Care Physician: None  Last Lipid Profile:Lipid Panel     Component Value Date/Time   CHOL 165 09/24/2012 1224   TRIG 54.0 09/24/2012 1224   HDL 79.10 09/24/2012 1224   CHOLHDL 2 09/24/2012 1224   VLDL 10.8 09/24/2012 1224   LDLCALC 75 09/24/2012 1224    Past Medical History  Diagnosis Date  . CAD (coronary artery disease) 05/18/10    s/p cath 05/18/2010 with overlapping drug eluting stents LAD.  Marland Kitchen Tobacco abuse     stopped smoking  . Hypertension     Past Surgical History  Procedure Laterality Date  . Hernia repair      s/p double hernia repair  . Broken jaw repair      Current Outpatient Prescriptions  Medication Sig Dispense Refill  . acetaminophen (TYLENOL) 325 MG tablet Take 650 mg by mouth every 6 (six) hours as needed.        Marland Kitchen aspirin 81 MG tablet Take 1 tablet (81 mg total) by mouth daily.  30 tablet    . clopidogrel (PLAVIX) 75 MG tablet Take 1 tablet (75 mg total) by mouth daily.  10 tablet  0  . losartan (COZAAR) 100 MG tablet Take 1 tablet (100 mg total) by mouth daily.  90 tablet  3  . metoprolol tartrate (LOPRESSOR) 25 MG tablet Take 1 tablet (25 mg total) by mouth 2 (two) times daily.  180 tablet  3  . nitroGLYCERIN (NITROSTAT) 0.4 MG SL tablet Place 1 tablet (0.4 mg total) under the tongue every 5 (five) minutes as needed.  25 tablet  11  . simvastatin (ZOCOR) 40 MG tablet Take 1 tablet (40 mg total) by mouth at bedtime.  90 tablet  3   No current  facility-administered medications for this visit.    No Known Allergies  History   Social History  . Marital Status: Married    Spouse Name: N/A    Number of Children: 3  . Years of Education: N/A   Occupational History  . student     [employer]  . gas station attendant    Social History Main Topics  . Smoking status: Current Some Day Smoker -- 35 years  . Smokeless tobacco: Not on file  . Alcohol Use: Yes     Comment: socially  . Drug Use: Yes    Special: Marijuana  . Sexual Activity: Not on file   Other Topics Concern  . Not on file   Social History Narrative  . No narrative on file    Family History  Problem Relation Age of Onset  . Coronary artery disease Father     Review of Systems:  As stated in the HPI and otherwise negative.   BP 159/91  Pulse 92  Ht 5\' 10"  (1.778 m)  Wt 169 lb (76.658 kg)  BMI 24.25 kg/m2  Physical Examination: General: Well developed, well nourished, NAD HEENT: OP clear, mucus membranes moist SKIN:  warm, dry. No rashes. Neuro: No focal deficits Musculoskeletal: Muscle strength 5/5 all ext Psychiatric: Mood and affect normal Neck: No JVD, no carotid bruits, no thyromegaly, no lymphadenopathy. Lungs:Clear bilaterally, no wheezes, rhonci, crackles Cardiovascular: Regular rate and rhythm. No murmurs, gallops or rubs. Abdomen:Soft. Bowel sounds present. Non-tender.  Extremities: No lower extremity edema. Pulses are 2 + in the bilateral DP/PT.  EKG: NSR, rate 92 bpm. LVH. Diffuse ST elevation., c/w repolarization (unchanged)  Assessment and Plan:   1. CAD: Stable. Continue current therapy including beta blocker, ARB, statin, ASA and Plavix. Will continue ASA and Plavix since he has overlapping drug eluting stents in the LAD. (Lisinopril was stopped due to facial rash).  Discussed echo to assess LVEF but he wishes to wait given finances. Will discuss at next visit.   2. HTN: BP elevated. Will increase Metoprolol to 50 mg po BID.  Continue Losartan.   3. Hyperlipidemia: Continue statin. Check lipids and LFTS in one week.

## 2013-09-17 NOTE — Patient Instructions (Addendum)
Your physician wants you to follow-up in:  6 months.  You will receive a reminder letter in the mail two months in advance. If you don't receive a letter, please call our office to schedule the follow-up appointment.   Your physician recommends that you return for fasting lab work in: next week or so  Your physician has recommended you make the following change in your medication: Increase lopressor to 50 mg by mouth twice daily.

## 2013-09-22 ENCOUNTER — Other Ambulatory Visit: Payer: Self-pay | Admitting: *Deleted

## 2013-09-22 DIAGNOSIS — I2510 Atherosclerotic heart disease of native coronary artery without angina pectoris: Secondary | ICD-10-CM

## 2013-09-22 DIAGNOSIS — I10 Essential (primary) hypertension: Secondary | ICD-10-CM

## 2013-09-22 MED ORDER — METOPROLOL TARTRATE 50 MG PO TABS: 50.0000 mg | ORAL_TABLET | Freq: Two times a day (BID) | ORAL | Status: DC

## 2013-09-23 ENCOUNTER — Other Ambulatory Visit: Payer: Commercial Indemnity

## 2013-09-25 ENCOUNTER — Other Ambulatory Visit (INDEPENDENT_AMBULATORY_CARE_PROVIDER_SITE_OTHER): Payer: Commercial Indemnity

## 2013-09-25 DIAGNOSIS — I2510 Atherosclerotic heart disease of native coronary artery without angina pectoris: Secondary | ICD-10-CM

## 2013-09-25 LAB — LIPID PANEL
HDL: 66.9 mg/dL (ref >39.00)
LDL Cholesterol: 90 mg/dL (ref 0–99)
VLDL: 17.4 mg/dL (ref 0.0–40.0)

## 2013-09-25 LAB — HEPATIC FUNCTION PANEL
Bilirubin, Direct: 0.1 mg/dL (ref 0.0–0.3)
Total Bilirubin: 0.7 mg/dL (ref 0.3–1.2)
Total Protein: 7.5 g/dL (ref 6.0–8.3)

## 2013-12-01 ENCOUNTER — Other Ambulatory Visit: Payer: Self-pay | Admitting: *Deleted

## 2013-12-01 DIAGNOSIS — I10 Essential (primary) hypertension: Secondary | ICD-10-CM

## 2013-12-01 DIAGNOSIS — I2510 Atherosclerotic heart disease of native coronary artery without angina pectoris: Secondary | ICD-10-CM

## 2013-12-01 MED ORDER — CLOPIDOGREL BISULFATE 75 MG PO TABS: 75.0000 mg | ORAL_TABLET | Freq: Every day | ORAL | Status: DC

## 2013-12-01 MED ORDER — LOSARTAN POTASSIUM 100 MG PO TABS: 100.0000 mg | ORAL_TABLET | Freq: Every day | ORAL | Status: DC

## 2013-12-21 ENCOUNTER — Emergency Department (HOSPITAL_COMMUNITY): Payer: Commercial Indemnity

## 2013-12-21 ENCOUNTER — Emergency Department (HOSPITAL_COMMUNITY)
Admission: EM | Admit: 2013-12-21 | Discharge: 2013-12-21 | Disposition: A | Discharge status: Home / Self Care | Payer: Commercial Indemnity | Attending: Emergency Medicine | Admitting: Emergency Medicine

## 2013-12-21 ENCOUNTER — Encounter (HOSPITAL_COMMUNITY): Payer: Self-pay | Admitting: Emergency Medicine

## 2013-12-21 DIAGNOSIS — K219 Gastro-esophageal reflux disease without esophagitis: Secondary | ICD-10-CM | POA: Insufficient documentation

## 2013-12-21 DIAGNOSIS — I10 Essential (primary) hypertension: Secondary | ICD-10-CM | POA: Insufficient documentation

## 2013-12-21 DIAGNOSIS — R079 Chest pain, unspecified: Secondary | ICD-10-CM

## 2013-12-21 DIAGNOSIS — F17200 Nicotine dependence, unspecified, uncomplicated: Secondary | ICD-10-CM

## 2013-12-21 DIAGNOSIS — Z7902 Long term (current) use of antithrombotics/antiplatelets: Secondary | ICD-10-CM | POA: Insufficient documentation

## 2013-12-21 DIAGNOSIS — R110 Nausea: Secondary | ICD-10-CM | POA: Insufficient documentation

## 2013-12-21 DIAGNOSIS — I2510 Atherosclerotic heart disease of native coronary artery without angina pectoris: Secondary | ICD-10-CM | POA: Insufficient documentation

## 2013-12-21 DIAGNOSIS — Z79899 Other long term (current) drug therapy: Secondary | ICD-10-CM | POA: Insufficient documentation

## 2013-12-21 DIAGNOSIS — Z7982 Long term (current) use of aspirin: Secondary | ICD-10-CM | POA: Insufficient documentation

## 2013-12-21 LAB — COMPREHENSIVE METABOLIC PANEL
ALK PHOS: 55 U/L (ref 39–117)
ALT: 29 U/L (ref 0–53)
AST: 22 U/L (ref 0–37)
Albumin: 4 g/dL (ref 3.5–5.2)
BILIRUBIN TOTAL: 0.3 mg/dL (ref 0.3–1.2)
BUN: 12 mg/dL (ref 6–23)
CHLORIDE: 95 meq/L — AB (ref 96–112)
CO2: 24 mEq/L (ref 19–32)
Calcium: 9.2 mg/dL (ref 8.4–10.5)
Creatinine, Ser: 0.77 mg/dL (ref 0.50–1.35)
GFR calc Af Amer: >90 mL/min (ref >90)
GFR calc non Af Amer: >90 mL/min (ref >90)
Glucose, Bld: 99 mg/dL (ref 70–99)
POTASSIUM: 3.9 meq/L (ref 3.7–5.3)
SODIUM: 133 meq/L — AB (ref 137–147)
Total Protein: 7.2 g/dL (ref 6.0–8.3)

## 2013-12-21 LAB — CBC WITH DIFFERENTIAL/PLATELET
BASOS ABS: 0 10*3/uL (ref 0.0–0.1)
Basophils Relative: 0 % (ref 0–1)
EOS PCT: 4 % (ref 0–5)
Eosinophils Absolute: 0.3 10*3/uL (ref 0.0–0.7)
HCT: 43.5 % (ref 39.0–52.0)
Hemoglobin: 15.6 g/dL (ref 13.0–17.0)
LYMPHS ABS: 2.1 10*3/uL (ref 0.7–4.0)
Lymphocytes Relative: 26 % (ref 12–46)
MCH: 33.8 pg (ref 26.0–34.0)
MCHC: 35.9 g/dL (ref 30.0–36.0)
MCV: 94.2 fL (ref 78.0–100.0)
Monocytes Absolute: 0.9 10*3/uL (ref 0.1–1.0)
Monocytes Relative: 10 % (ref 3–12)
Neutro Abs: 5 10*3/uL (ref 1.7–7.7)
Neutrophils Relative %: 60 % (ref 43–77)
PLATELETS: 235 10*3/uL (ref 150–400)
RBC: 4.62 MIL/uL (ref 4.22–5.81)
RDW: 12.8 % (ref 11.5–15.5)
WBC: 8.3 10*3/uL (ref 4.0–10.5)

## 2013-12-21 LAB — TROPONIN I: Troponin I: <0.3 ng/mL (ref <0.30)

## 2013-12-21 LAB — I-STAT TROPONIN, ED: Troponin i, poc: 0 ng/mL (ref 0.00–0.08)

## 2013-12-21 MED ORDER — GI COCKTAIL ~~LOC~~
30.0000 mL | Freq: Once | ORAL | Status: AC
Start: 2013-12-21 — End: 2013-12-21
  Administered 2013-12-21: 30 mL via ORAL
  Filled 2013-12-21: qty 30

## 2013-12-21 MED ORDER — ASPIRIN 325 MG PO TABS
325.0000 mg | ORAL_TABLET | ORAL | Status: AC
  Administered 2013-12-21: 325 mg via ORAL
  Filled 2013-12-21: qty 1

## 2013-12-21 MED ORDER — PANTOPRAZOLE SODIUM 40 MG IV SOLR
40.0000 mg | Freq: Once | INTRAVENOUS | Status: AC
  Administered 2013-12-21: 40 mg via INTRAVENOUS
  Filled 2013-12-21: qty 40

## 2013-12-21 MED ORDER — NICOTINE 14 MG/24HR TD PT24
14.0000 mg | MEDICATED_PATCH | Freq: Once | TRANSDERMAL | Status: DC
  Administered 2013-12-21: 14 mg via TRANSDERMAL
  Filled 2013-12-21: qty 1

## 2013-12-21 MED ORDER — SUCRALFATE 1 G PO TABS: 1.0000 g | ORAL_TABLET | Freq: Four times a day (QID) | ORAL | Status: DC

## 2013-12-21 MED ORDER — SUCRALFATE 1 G PO TABS
1.0000 g | ORAL_TABLET | Freq: Once | ORAL | Status: AC
  Administered 2013-12-21: 1 g via ORAL
  Filled 2013-12-21: qty 1

## 2013-12-21 MED ORDER — PANTOPRAZOLE SODIUM 20 MG PO TBEC: 40.0000 mg | DELAYED_RELEASE_TABLET | Freq: Every day | ORAL | Status: DC

## 2013-12-21 NOTE — ED Notes (Signed)
C/o CP, onset 2 hrs ago, was constant, now comes and goes, took 1 - 81 mg ASA PTA, h/o stents, rates pain 2/10 now, was 7/10, wife dropped him off, pt of Dr. Angelena Form, has own copy of past EKG, EKG seen initially by Dr. Sharol Given at [phone]. Alert, NAD, calm, interactive.

## 2013-12-21 NOTE — ED Provider Notes (Signed)
CSN: [id number]     Arrival date & time 12/21/13  0102 History   First MD Initiated Contact with Patient 12/21/13 [phone]     Chief Complaint  Patient presents with  . Chest Pain     (Consider location/radiation/quality/duration/timing/severity/associated sxs/prior Treatment) HPI 48 year old male presents to emergency room with complaint of chest pain.  Patient reports pain started around midnight after getting off work .  Patient reports pain started while drinking a beer.  He reports he felt like it went down the wrong way.  He denies any coughing or sputtering or aspiration-type symptoms, only pain with swallowing.  Since that time he has had intermittent persistent upper epigastric spasm type pain.  Patient has history of coronary disease, status post stent to LAD in 2011.  Patient reports pain is somewhat similar to the pain that made him get the stent in the first place.  He denies any diaphoresis, shortness of breath.  He says mild nausea.  He comes in waves lasts less than a minute, and then resolves.  She reports he has been burping, and feels that he is having some indigestion.  He is a metal taste in the back of his mouth. Past Medical History  Diagnosis Date  . CAD (coronary artery disease) 05/18/10    s/p cath 05/18/2010 with overlapping drug eluting stents LAD.  Marland Kitchen Tobacco abuse     stopped smoking  . Hypertension    Past Surgical History  Procedure Laterality Date  . Hernia repair      s/p double hernia repair  . Broken jaw repair     Family History  Problem Relation Age of Onset  . Coronary artery disease Father    History  Substance Use Topics  . Smoking status: Current Some Day Smoker -- 35 years  . Smokeless tobacco: Not on file  . Alcohol Use: Yes     Comment: socially    Review of Systems  See History of Present Illness; otherwise all other systems are reviewed and negative   Allergies  Review of patient's allergies indicates no known allergies.  Home  Medications   Current Outpatient Rx  Name  Route  Sig  Dispense  Refill  . acetaminophen (TYLENOL) 325 MG tablet   Oral   Take 650 mg by mouth every 6 (six) hours as needed.           Marland Kitchen aspirin 81 MG tablet   Oral   Take 1 tablet (81 mg total) by mouth daily.   30 tablet      . clopidogrel (PLAVIX) 75 MG tablet   Oral   Take 1 tablet (75 mg total) by mouth daily.   90 tablet   1   . losartan (COZAAR) 100 MG tablet   Oral   Take 1 tablet (100 mg total) by mouth daily.   90 tablet   1   . metoprolol (LOPRESSOR) 50 MG tablet   Oral   Take 1 tablet (50 mg total) by mouth 2 (two) times daily.   180 tablet   3   . nitroGLYCERIN (NITROSTAT) 0.4 MG SL tablet   Sublingual   Place 1 tablet (0.4 mg total) under the tongue every 5 (five) minutes as needed.   25 tablet   11   . pantoprazole (PROTONIX) 20 MG tablet   Oral   Take 2 tablets (40 mg total) by mouth daily.   30 tablet   0   . simvastatin (ZOCOR) 40 MG tablet  Oral   Take 1 tablet (40 mg total) by mouth at bedtime.   90 tablet   3   . sucralfate (CARAFATE) 1 G tablet   Oral   Take 1 tablet (1 g total) by mouth 4 (four) times daily.   30 tablet   0    BP 141/86  Pulse 78  Temp(Src) 98.9 F (37.2 C) (Oral)  Resp 20  Ht 5\' 10"  (1.778 m)  Wt 185 lb (83.915 kg)  BMI 26.54 kg/m2  SpO2 97% Physical Exam  Nursing note and vitals reviewed. Constitutional: He is oriented to person, place, and time. He appears well-developed and well-nourished.  HENT:  Head: Normocephalic and atraumatic.  Nose: Nose normal.  Mouth/Throat: Oropharynx is clear and moist.  Eyes: Conjunctivae and EOM are normal. Pupils are equal, round, and reactive to light.  Neck: Normal range of motion. Neck supple. No JVD present. No tracheal deviation present. No thyromegaly present.  Cardiovascular: Normal rate, regular rhythm, normal heart sounds and intact distal pulses.  Exam reveals no gallop and no friction rub.   No murmur  heard. Pulmonary/Chest: Effort normal and breath sounds normal. No stridor. No respiratory distress. He has no wheezes. He has no rales. He exhibits no tenderness.  Abdominal: Soft. He exhibits no distension and no mass. There is no tenderness. There is no rebound and no guarding.  hyper active bowel sounds  Musculoskeletal: Normal range of motion. He exhibits no edema and no tenderness.  Lymphadenopathy:    He has no cervical adenopathy.  Neurological: He is alert and oriented to person, place, and time. He has normal reflexes. No cranial nerve deficit. He exhibits normal muscle tone. Coordination normal.  Skin: Skin is warm and dry. No rash noted. No erythema. No pallor.  Psychiatric: He has a normal mood and affect. His behavior is normal. Judgment and thought content normal.    ED Course  Procedures (including critical care time) Labs Review Labs Reviewed  COMPREHENSIVE METABOLIC PANEL - Abnormal; Notable for the following:    Sodium 133 (*)    Chloride 95 (*)    All other components within normal limits  CBC WITH DIFFERENTIAL  TROPONIN I  Randolm Idol, ED   Imaging Review Dg Chest Port 1 View  12/21/2013   CLINICAL DATA:  Chest pain  EXAM: PORTABLE CHEST - 1 VIEW  COMPARISON:  12/23/2010  FINDINGS: Pulmonary hyperinflation. No infiltrate or edema. No effusion or pneumothorax. There is a well-circumscribed rounded 12 x 7 mm density overlapping the peripheral left base. The structure is as dense as neighboring bone, favoring a calcified or osseous structure. There are likely calcified granulomas elsewhere in the bilateral lungs.  IMPRESSION: No active disease.   Electronically Signed   By: Jorje Guild M.D.   On: 12/21/2013 03:19     EKG Interpretation   Date/Time:  Sunday December 21 2013 01:06:47 EST Ventricular Rate:  91 PR Interval:  146 QRS Duration: 94 QT Interval:  352 QTC Calculation: 432 R Axis:   74 Text Interpretation:  Normal sinus rhythm Normal ECG Confirmed  by Jovon Winterhalter   MD, Kdyn Vonbehren ([id number]) on 12/21/2013 4:52:51 AM       EKG Interpretation  Date/Time:  Sunday December 21 2013 01:06:47 EST Ventricular Rate:  91 PR Interval:  146 QRS Duration: 94 QT Interval:  352 QTC Calculation: 432 R Axis:   74 Text Interpretation:  Normal sinus rhythm Normal ECG Confirmed by Romina Divirgilio  MD, Andrius Andrepont ([id number]) on 12/21/2013 4:52:51 AM  MDM   Final diagnoses:  Chest pain  GERD (gastroesophageal reflux disease)  TOBACCO ABUSE    48 year old male with atypical chest pain.  He does have history of coronary disease, but had onset of pain while swallowing.  He has had 2 negative troponins, and no ischemic changes on his EKG.  Discussed with Dr. Percival Spanish  on-call for cardiology.  I feel that patient is stable for discharge to followup with his primary cardiologist.  Burnis Medin start him on Protonix and Carafate in the meantime.  Patient has been counseled on stopping smoking.    Kalman Drape, MD 12/21/13 [phone]

## 2013-12-21 NOTE — ED Notes (Signed)
After pt took asa  Pt sustain pain in his  chest while swallowing pill and water

## 2013-12-21 NOTE — Discharge Instructions (Signed)
Chest Pain Observation It is often hard to give a specific diagnosis for the cause of chest pain. Among other possibilities your symptoms might be caused by inadequate oxygen delivery to your heart (angina). Angina that is not treated or evaluated can lead to a heart attack (myocardial infarction) or death. Blood tests, electrocardiograms, and X-rays may have been done to help determine a possible cause of your chest pain. After evaluation and observation, your health care provider has determined that it is unlikely your pain was caused by an unstable condition that requires hospitalization. However, a full evaluation of your pain may need to be completed, with additional diagnostic testing as directed. It is very important to keep your follow-up appointments. Not keeping your follow-up appointments could result in permanent heart damage, disability, or death. If there is any problem keeping your follow-up appointments, you must call your health care provider. HOME CARE INSTRUCTIONS  Due to the slight chance that your pain could be angina, it is important to follow your health care provider's treatment plan and also maintain a healthy lifestyle:  Maintain or work toward achieving a healthy weight.  Stay physically active and exercise regularly.  Decrease your salt intake.  Eat a balanced, healthy diet. Talk to a dietician to learn about heart healthy foods.  Increase your fiber intake by including whole grains, vegetables, fruits, and nuts in your diet.  Avoid situations that cause stress, anger, or depression.  Take medicines as advised by your health care provider. Report any side effects to your health care provider. Do not stop medicines or adjust the dosages on your own.  Quit smoking. Do not use nicotine patches or gum until you check with your health care provider.  Keep your blood pressure, blood sugar, and cholesterol levels within normal limits.  Limit alcohol intake to no more than  1 drink per day for women that are not pregnant and 2 drinks per day for men.  Do not abuse drugs. SEEK IMMEDIATE MEDICAL CARE IF: You have severe chest pain or pressure which may include symptoms such as:  You feel pain or pressure in you arms, neck, jaw, or back.  You have severe back or abdominal pain, feel sick to your stomach (nauseous), or throw up (vomit).  You are sweating profusely.  You are having a fast or irregular heartbeat.  You feel short of breath while at rest.  You notice increasing shortness of breath during rest, sleep, or with activity.  You have chest pain that does not get better after rest or after taking your usual medicine.  You wake from sleep with chest pain.  You are unable to sleep because you cannot breathe.  You develop a frequent cough or you are coughing up blood.  You feel dizzy, faint, or experience extreme fatigue.  You develop severe weakness, dizziness, fainting, or chills. Any of these symptoms may represent a serious problem that is an emergency. Do not wait to see if the symptoms will go away. Call your local emergency services (911 in the [place] Do not drive yourself to the hospital. MAKE SURE YOU:  Understand these instructions.  Will watch your condition.  Will get help right away if you are not doing well or get worse. Document Released: 11/04/2010 Document Revised: 06/04/2013 Document Reviewed: 04/03/2013 [facility] Patient Information 2014 [place], [place].  Diet for Gastroesophageal Reflux Disease, Adult Reflux (acid reflux) is when acid from your stomach flows up into the esophagus. When acid comes in contact with the esophagus,  the acid causes irritation and soreness (inflammation) in the esophagus. When reflux happens often or so severely that it causes damage to the esophagus, it is called gastroesophageal reflux disease (GERD). Nutrition therapy can help ease the discomfort of GERD. FOODS OR DRINKS TO AVOID OR  LIMIT  Smoking or chewing tobacco. Nicotine is one of the most potent stimulants to acid production in the gastrointestinal tract.  Caffeinated and decaffeinated coffee and black tea.  Regular or low-calorie carbonated beverages or energy drinks (caffeine-free carbonated beverages are allowed).   Strong spices, such as black pepper, white pepper, red pepper, cayenne, curry powder, and chili powder.  Peppermint or spearmint.  Chocolate.  High-fat foods, including meats and fried foods. Extra added fats including oils, butter, salad dressings, and nuts. Limit these to less than 8 tsp per day.  Fruits and vegetables if they are not tolerated, such as citrus fruits or tomatoes.  Alcohol.  Any food that seems to aggravate your condition. If you have questions regarding your diet, call your caregiver or a registered dietitian. OTHER THINGS THAT MAY HELP GERD INCLUDE:   Eating your meals slowly, in a relaxed setting.  Eating 5 to 6 small meals per day instead of 3 large meals.  Eliminating food for a period of time if it causes distress.  Not lying down until 3 hours after eating a meal.  Keeping the head of your bed raised 6 to 9 inches (15 to 23 cm) by using a foam wedge or blocks under the legs of the bed. Lying flat may make symptoms worse.  Being physically active. Weight loss may be helpful in reducing reflux in overweight or obese adults.  Wear loose fitting clothing EXAMPLE MEAL PLAN This meal plan is approximately 2,000 calories based on [email or web address] meal planning guidelines. Breakfast   cup cooked oatmeal.  1 cup strawberries.  1 cup low-fat milk.  1 oz almonds. Snack  1 cup cucumber slices.  6 oz yogurt (made from low-fat or fat-free milk). Lunch  2 slice whole-wheat bread.  2 oz sliced [place].  2 tsp mayonnaise.  1 cup blueberries.  1 cup snap peas. Snack  6 whole-wheat crackers.  1 oz string cheese. Dinner   cup brown rice.  1  cup mixed veggies.  1 tsp olive oil.  3 oz grilled fish. Document Released: 10/02/2005 Document Revised: 12/25/2011 Document Reviewed: 08/18/2011 [facility] Patient Information 2014 [place], [place].  Gastroesophageal Reflux Disease, Adult Gastroesophageal reflux disease (GERD) happens when acid from your stomach goes into your food pipe (esophagus). The acid can cause a burning feeling in your chest. Over time, the acid can make small holes (ulcers) in your food pipe.  HOME CARE  Ask your doctor for advice about:  Losing weight.  Quitting smoking.  Alcohol use.  Avoid foods and drinks that make your problems worse. You may want to avoid:  Caffeine and alcohol.  Chocolate.  Mints.  Garlic and onions.  Spicy foods.  Citrus fruits, such as oranges, lemons, or limes.  Foods that contain tomato, such as sauce, chili, salsa, and pizza.  Fried and fatty foods.  Avoid lying down for 3 hours before you go to bed or before you take a nap.  Eat small meals often, instead of large meals.  Wear loose-fitting clothing. Do not wear anything tight around your waist.  Raise (elevate) the head of your bed 6 to 8 inches with wood blocks. Using extra pillows does not help.  Only take medicines as told by  your doctor.  Do not take aspirin or ibuprofen. GET HELP RIGHT AWAY IF:   You have pain in your arms, neck, jaw, teeth, or back.  Your pain gets worse or changes.  You feel sick to your stomach (nauseous), throw up (vomit), or sweat (diaphoresis).  You feel short of breath, or you pass out (faint).  Your throw up is green, yellow, black, or looks like coffee grounds or blood.  Your poop (stool) is red, bloody, or black. MAKE SURE YOU:   Understand these instructions.  Will watch your condition.  Will get help right away if you are not doing well or get worse. Document Released: 03/20/2008 Document Revised: 12/25/2011 Document Reviewed: 04/21/2011 [facility] Patient  Information 2014 [place], [place].  Smoking Cessation Quitting smoking is important to your health and has many advantages. However, it is not always easy to quit since nicotine is a very addictive drug. Often times, people try 3 times or more before being able to quit. This document explains the best ways for you to prepare to quit smoking. Quitting takes hard work and a lot of effort, but you can do it. ADVANTAGES OF QUITTING SMOKING  You will live longer, feel better, and live better.  Your body will feel the impact of quitting smoking almost immediately.  Within 20 minutes, blood pressure decreases. Your pulse returns to its normal level.  After 8 hours, carbon monoxide levels in the blood return to normal. Your oxygen level increases.  After 24 hours, the chance of having a heart attack starts to decrease. Your breath, hair, and body stop smelling like smoke.  After 48 hours, damaged nerve endings begin to recover. Your sense of taste and smell improve.  After 72 hours, the body is virtually free of nicotine. Your bronchial tubes relax and breathing becomes easier.  After 2 to 12 weeks, lungs can hold more air. Exercise becomes easier and circulation improves.  The risk of having a heart attack, stroke, cancer, or lung disease is greatly reduced.  After 1 year, the risk of coronary heart disease is cut in half.  After 5 years, the risk of stroke falls to the same as a nonsmoker.  After 10 years, the risk of lung cancer is cut in half and the risk of other cancers decreases significantly.  After 15 years, the risk of coronary heart disease drops, usually to the level of a nonsmoker.  If you are pregnant, quitting smoking will improve your chances of having a healthy baby.  The people you live with, especially any children, will be healthier.  You will have extra money to spend on things other than cigarettes. QUESTIONS TO THINK ABOUT BEFORE ATTEMPTING TO QUIT You may want to  talk about your answers with your caregiver.  Why do you want to quit?  If you tried to quit in the past, what helped and what did not?  What will be the most difficult situations for you after you quit? How will you plan to handle them?  Who can help you through the tough times? Your family? Friends? A caregiver?  What pleasures do you get from smoking? What ways can you still get pleasure if you quit? Here are some questions to ask your caregiver:  How can you help me to be successful at quitting?  What medicine do you think would be best for me and how should I take it?  What should I do if I need more help?  What is smoking withdrawal like?  How can I get information on withdrawal? GET READY  Set a quit date.  Change your environment by getting rid of all cigarettes, ashtrays, matches, and lighters in your home, car, or work. Do not let people smoke in your home.  Review your past attempts to quit. Think about what worked and what did not. GET SUPPORT AND ENCOURAGEMENT You have a better chance of being successful if you have help. You can get support in many ways.  Tell your family, friends, and co-workers that you are going to quit and need their support. Ask them not to smoke around you.  Get individual, group, or telephone counseling and support. Programs are available at [employer] and health centers. Call your local health department for information about programs in your area.  Spiritual beliefs and practices may help some smokers quit.  Download a "quit meter" on your computer to keep track of quit statistics, such as how long you have gone without smoking, cigarettes not smoked, and money saved.  Get a self-help book about quitting smoking and staying off of tobacco. [place] yourself from urges to smoke. Talk to someone, go for a walk, or occupy your time with a task.  Change your normal routine. Take a different route to  work. Drink tea instead of coffee. Eat breakfast in a different place.  Reduce your stress. Take a hot bath, exercise, or read a book.  Plan something enjoyable to do every day. Reward yourself for not smoking.  Explore interactive web-based programs that specialize in helping you quit. GET MEDICINE AND USE IT CORRECTLY Medicines can help you stop smoking and decrease the urge to smoke. Combining medicine with the above behavioral methods and support can greatly increase your chances of successfully quitting smoking.  Nicotine replacement therapy helps deliver nicotine to your body without the negative effects and risks of smoking. Nicotine replacement therapy includes nicotine gum, lozenges, inhalers, nasal sprays, and skin patches. Some may be available over-the-counter and others require a prescription.  Antidepressant medicine helps people abstain from smoking, but how this works is unknown. This medicine is available by prescription.  Nicotinic receptor partial agonist medicine simulates the effect of nicotine in your brain. This medicine is available by prescription. Ask your caregiver for advice about which medicines to use and how to use them based on your health history. Your caregiver will tell you what side effects to look out for if you choose to be on a medicine or therapy. Carefully read the information on the package. Do not use any other product containing nicotine while using a nicotine replacement product.  RELAPSE OR DIFFICULT SITUATIONS Most relapses occur within the first 3 months after quitting. Do not be discouraged if you start smoking again. Remember, most people try several times before finally quitting. You may have symptoms of withdrawal because your body is used to nicotine. You may crave cigarettes, be irritable, feel very hungry, cough often, get headaches, or have difficulty concentrating. The withdrawal symptoms are only temporary. They are strongest when you first  quit, but they will go away within 10 14 days. To reduce the chances of relapse, try to:  Avoid drinking alcohol. Drinking lowers your chances of successfully quitting.  Reduce the amount of caffeine you consume. Once you quit smoking, the amount of caffeine in your body increases and can give you symptoms, such as a rapid heartbeat, sweating, and anxiety.  Avoid smokers because they can make you want to  smoke.  Do not let weight gain distract you. Many smokers will gain weight when they quit, usually less than 10 pounds. Eat a healthy diet and stay active. You can always lose the weight gained after you quit.  Find ways to improve your mood other than smoking. FOR MORE INFORMATION  www.smokefree.gov  Document Released: 09/26/2001 Document Revised: 04/02/2012 Document Reviewed: 01/11/2012 [facility] Patient Information 2014 [place], [place].

## 2014-03-23 ENCOUNTER — Other Ambulatory Visit: Payer: Self-pay

## 2014-03-23 MED ORDER — SIMVASTATIN 40 MG PO TABS: 40.0000 mg | ORAL_TABLET | Freq: Every day | ORAL | Status: DC

## 2014-04-30 NOTE — Telephone Encounter (Signed)
Close Encounter 

## 2014-05-22 ENCOUNTER — Other Ambulatory Visit: Payer: Self-pay

## 2014-05-22 MED ORDER — CLOPIDOGREL BISULFATE 75 MG PO TABS: 75.0000 mg | ORAL_TABLET | Freq: Every day | ORAL | Status: DC

## 2014-05-27 ENCOUNTER — Encounter: Payer: Self-pay | Admitting: Cardiovascular Disease

## 2014-05-27 ENCOUNTER — Ambulatory Visit (INDEPENDENT_AMBULATORY_CARE_PROVIDER_SITE_OTHER): Payer: Commercial Indemnity | Admitting: Cardiovascular Disease

## 2014-05-27 VITALS — BP 140/92 | HR 60 | Ht 70.0 in | Wt 174.0 lb

## 2014-05-27 DIAGNOSIS — E785 Hyperlipidemia, unspecified: Secondary | ICD-10-CM

## 2014-05-27 DIAGNOSIS — I10 Essential (primary) hypertension: Secondary | ICD-10-CM

## 2014-05-27 DIAGNOSIS — I2510 Atherosclerotic heart disease of native coronary artery without angina pectoris: Secondary | ICD-10-CM

## 2014-05-27 NOTE — Patient Instructions (Signed)
Your physician wants you to follow-up in:  12 months.  You will receive a reminder letter in the mail two months in advance. If you don't receive a letter, please call our office to schedule the follow-up appointment.   

## 2014-05-27 NOTE — Progress Notes (Signed)
History of Present Illness: 48 yo WM with history of tobacco abuse, HTN and CAD who is here today for cardiac follow up. He was admitted to [facility] on May 18, 2010 with a NSTEMI and was found to have severe obstructive disease in the LAD. Two drug eluting stents were placed in the LAD. Mild hypokinesis of the anterolateral wall. No stress testing or f/u echo since then as pt has refused due to finances. Seen in the ED March 2015 with chest pain after drinking a beer. Troponin was negative. Felt to be GI related. Started on PPI.   He is here today for follow up. He has been taking all of his medications. He has stopped smoking completely. He is using a vaporizer. He is working at [employer]. No chest pain, SOB, palpitations. Overall feeling well.   Primary Care Physician: None  Last Lipid Profile:Lipid Panel     Component Value Date/Time   CHOL 174 09/25/2013 1147   TRIG 87.0 09/25/2013 1147   HDL 66.90 09/25/2013 1147   CHOLHDL 3 09/25/2013 1147   VLDL 17.4 09/25/2013 1147   [place] 90 09/25/2013 1147    Past Medical History  Diagnosis Date  . CAD (coronary artery disease) 05/18/10    s/p cath 05/18/2010 with overlapping drug eluting stents LAD.  Marland Kitchen Tobacco abuse     stopped smoking  . Hypertension     Past Surgical History  Procedure Laterality Date  . Hernia repair      s/p double hernia repair  . Broken jaw repair      Current Outpatient Prescriptions  Medication Sig Dispense Refill  . acetaminophen (TYLENOL) 325 MG tablet Take 650 mg by mouth every 6 (six) hours as needed.        Marland Kitchen aspirin 81 MG tablet Take 1 tablet (81 mg total) by mouth daily.  30 tablet    . clopidogrel (PLAVIX) 75 MG tablet Take 1 tablet (75 mg total) by mouth daily.  90 tablet  1  . losartan (COZAAR) 100 MG tablet Take 1 tablet (100 mg total) by mouth daily.  90 tablet  1  . metoprolol (LOPRESSOR) 50 MG tablet Take 1 tablet (50 mg total) by mouth 2 (two) times daily.  180 tablet  3  .  nitroGLYCERIN (NITROSTAT) 0.4 MG SL tablet Place 1 tablet (0.4 mg total) under the tongue every 5 (five) minutes as needed.  25 tablet  11  . pantoprazole (PROTONIX) 20 MG tablet Take 20 mg by mouth daily.      . simvastatin (ZOCOR) 40 MG tablet Take 1 tablet (40 mg total) by mouth at bedtime.  90 tablet  0  . fluticasone (FLONASE) 50 MCG/ACT nasal spray Place 1 spray into both nostrils daily.        No current facility-administered medications for this visit.    No Known Allergies  History   Social History  . Marital Status: Married    Spouse Name: N/A    Number of Children: 3  . Years of Education: N/A   Occupational History  . student     [employer]  . gas station attendant    Social History Main Topics  . Smoking status: Former Smoker -- 35 years  . Smokeless tobacco: Not on file  . Alcohol Use: Yes     Comment: socially  . Drug Use: Yes    Special: Marijuana  . Sexual Activity: Not on file   Other Topics Concern  .  Not on file   Social History Narrative  . No narrative on file    Family History  Problem Relation Age of Onset  . Coronary artery disease Father     Review of Systems:  As stated in the HPI and otherwise negative.   BP 140/92  Pulse 60  Ht 5\' 10"  (1.778 m)  Wt 174 lb (78.926 kg)  BMI 24.97 kg/m2  Physical Examination: General: Well developed, well nourished, NAD HEENT: OP clear, mucus membranes moist SKIN: warm, dry. No rashes. Neuro: No focal deficits Musculoskeletal: Muscle strength 5/5 all ext Psychiatric: Mood and affect normal Neck: No JVD, no carotid bruits, no thyromegaly, no lymphadenopathy. Lungs:Clear bilaterally, no wheezes, rhonci, crackles Cardiovascular: Regular rate and rhythm. No murmurs, gallops or rubs. Abdomen:Soft. Bowel sounds present. Non-tender.  Extremities: No lower extremity edema. Pulses are 2 + in the bilateral DP/PT.  Assessment and Plan:   1. CAD: Stable. Continue current therapy including beta blocker, ARB,  statin, ASA and Plavix. Will continue ASA and Plavix since he has overlapping drug eluting stents in the LAD. Discussed echo to assess LVEF but he wishes to wait given finances.   2. HTN: BP controlled. NO changes.    3. Hyperlipidemia: Continue statin.

## 2014-07-24 ENCOUNTER — Other Ambulatory Visit: Payer: Self-pay

## 2014-07-24 MED ORDER — SIMVASTATIN 40 MG PO TABS: 40.0000 mg | ORAL_TABLET | Freq: Every day | ORAL | Status: DC

## 2014-09-03 ENCOUNTER — Other Ambulatory Visit: Payer: Self-pay | Admitting: *Deleted

## 2014-09-03 MED ORDER — LOSARTAN POTASSIUM 100 MG PO TABS: 100.0000 mg | ORAL_TABLET | Freq: Every day | ORAL | Status: DC

## 2014-09-03 MED ORDER — CLOPIDOGREL BISULFATE 75 MG PO TABS: 75.0000 mg | ORAL_TABLET | Freq: Every day | ORAL | Status: DC

## 2014-12-03 ENCOUNTER — Other Ambulatory Visit: Payer: Self-pay

## 2014-12-03 DIAGNOSIS — I10 Essential (primary) hypertension: Secondary | ICD-10-CM

## 2014-12-03 MED ORDER — METOPROLOL TARTRATE 50 MG PO TABS: 50.0000 mg | ORAL_TABLET | Freq: Two times a day (BID) | ORAL | Status: DC

## 2015-03-01 ENCOUNTER — Other Ambulatory Visit: Payer: Self-pay | Admitting: *Deleted

## 2015-03-01 MED ORDER — CLOPIDOGREL BISULFATE 75 MG PO TABS: 75.0000 mg | ORAL_TABLET | Freq: Every day | ORAL | Status: DC

## 2015-05-14 ENCOUNTER — Other Ambulatory Visit: Payer: Self-pay | Admitting: Cardiovascular Disease

## 2015-08-30 ENCOUNTER — Telehealth: Payer: Self-pay | Admitting: Cardiovascular Disease

## 2015-08-30 NOTE — Telephone Encounter (Signed)
Pt has not been seen sent 05/30/2014. Would you like to refill these medications? Please advise

## 2015-08-30 NOTE — Telephone Encounter (Signed)
°*  STAT* If patient is at the pharmacy, call can be transferred to refill team.   1. Which medications need to be refilled? (please list name of each medication and dose if known)  Clopidogrel '75mg'$  Losartan '100mg'$  Simvastatin '40mg'$   2. Which pharmacy/location (including street and city if local pharmacy) is medication to be sent to?[facility]/ Address [place] [address]/Phone # [phone]  6. Do they need a 30 day or 90 day supply? 90day

## 2015-09-01 MED ORDER — CLOPIDOGREL BISULFATE 75 MG PO TABS: 75.0000 mg | ORAL_TABLET | Freq: Every day | ORAL | Status: DC

## 2015-09-01 MED ORDER — SIMVASTATIN 40 MG PO TABS: 40.0000 mg | ORAL_TABLET | Freq: Every day | ORAL | Status: DC

## 2015-09-01 MED ORDER — LOSARTAN POTASSIUM 100 MG PO TABS: ORAL_TABLET | ORAL | Status: DC

## 2015-09-01 NOTE — Telephone Encounter (Signed)
Prescription sent to pharmacy for 90 day supply with no refills.  I placed call to pt's wife to schedule appt.  Left message to call back

## 2015-09-02 NOTE — Telephone Encounter (Signed)
Left message on pt's voicemail to call office

## 2015-09-03 ENCOUNTER — Telehealth: Payer: Self-pay | Admitting: Cardiovascular Disease

## 2015-09-03 NOTE — Telephone Encounter (Signed)
New problem    Pt returning your call and stated to leave him a voicemail and he can call you back monday

## 2015-09-03 NOTE — Telephone Encounter (Signed)
Message left on pt's identified voicemail that he is due for a follow up appt with Dr. Angelena Form and to please call our office to schedule this.

## 2015-09-06 NOTE — Telephone Encounter (Signed)
Appt has been scheduled for 12/02/15

## 2015-09-06 NOTE — Telephone Encounter (Signed)
Appt has been scheduled.

## 2015-10-15 ENCOUNTER — Emergency Department (HOSPITAL_COMMUNITY)
Admission: EM | Admit: 2015-10-15 | Discharge: 2015-10-15 | Disposition: A | Discharge status: Home / Self Care | Payer: Managed Care, Other (non HMO) | Attending: Emergency Medicine | Admitting: Emergency Medicine

## 2015-10-15 ENCOUNTER — Encounter (HOSPITAL_COMMUNITY): Payer: Self-pay | Admitting: *Deleted

## 2015-10-15 ENCOUNTER — Emergency Department (HOSPITAL_COMMUNITY): Payer: Managed Care, Other (non HMO)

## 2015-10-15 DIAGNOSIS — S0181XA Laceration without foreign body of other part of head, initial encounter: Secondary | ICD-10-CM | POA: Diagnosis not present

## 2015-10-15 DIAGNOSIS — Y9389 Activity, other specified: Secondary | ICD-10-CM | POA: Insufficient documentation

## 2015-10-15 DIAGNOSIS — W01198A Fall on same level from slipping, tripping and stumbling with subsequent striking against other object, initial encounter: Secondary | ICD-10-CM | POA: Insufficient documentation

## 2015-10-15 DIAGNOSIS — Z87891 Personal history of nicotine dependence: Secondary | ICD-10-CM | POA: Diagnosis not present

## 2015-10-15 DIAGNOSIS — Z7902 Long term (current) use of antithrombotics/antiplatelets: Secondary | ICD-10-CM | POA: Insufficient documentation

## 2015-10-15 DIAGNOSIS — I2510 Atherosclerotic heart disease of native coronary artery without angina pectoris: Secondary | ICD-10-CM | POA: Diagnosis not present

## 2015-10-15 DIAGNOSIS — Z79899 Other long term (current) drug therapy: Secondary | ICD-10-CM | POA: Insufficient documentation

## 2015-10-15 DIAGNOSIS — I10 Essential (primary) hypertension: Secondary | ICD-10-CM | POA: Diagnosis not present

## 2015-10-15 DIAGNOSIS — S0990XA Unspecified injury of head, initial encounter: Secondary | ICD-10-CM | POA: Diagnosis present

## 2015-10-15 DIAGNOSIS — Z7951 Long term (current) use of inhaled steroids: Secondary | ICD-10-CM | POA: Insufficient documentation

## 2015-10-15 DIAGNOSIS — Y9289 Other specified places as the place of occurrence of the external cause: Secondary | ICD-10-CM | POA: Insufficient documentation

## 2015-10-15 DIAGNOSIS — Z7982 Long term (current) use of aspirin: Secondary | ICD-10-CM | POA: Diagnosis not present

## 2015-10-15 DIAGNOSIS — Z23 Encounter for immunization: Secondary | ICD-10-CM | POA: Insufficient documentation

## 2015-10-15 DIAGNOSIS — Y998 Other external cause status: Secondary | ICD-10-CM | POA: Insufficient documentation

## 2015-10-15 DIAGNOSIS — W19XXXA Unspecified fall, initial encounter: Secondary | ICD-10-CM

## 2015-10-15 MED ORDER — TETANUS-DIPHTH-ACELL PERTUSSIS 5-2.5-18.5 LF-MCG/0.5 IM SUSP
0.5000 mL | Freq: Once | INTRAMUSCULAR | Status: AC
Start: 2015-10-15 — End: 2015-10-15
  Administered 2015-10-15: 0.5 mL via INTRAMUSCULAR
  Filled 2015-10-15: qty 0.5

## 2015-10-15 NOTE — Discharge Instructions (Signed)
Fall Prevention in Hospitals, Adult As a hospital patient, your condition and the treatments you receive can increase your risk for falls. Some additional risk factors for falls in a hospital include:  Being in an unfamiliar environment.  Being on bed rest.  Your surgery.  Taking certain medicines.  Your tubing requirements, such as intravenous (IV) therapy or catheters. It is important that you learn how to decrease fall risks while at the hospital. Below are important tips that can help prevent falls. SAFETY TIPS FOR PREVENTING FALLS Talk about your risk of falling.  Ask your health care provider why you are at risk for falling. Is it your medicine, illness, tubing placement, or something else?  Make a plan with your health care provider to keep you safe from falls.  Ask your health care provider or pharmacist about side effects of your medicines. Some medicines can make you dizzy or affect your coordination. Ask for help.  Ask for help before getting out of bed. You may need to press your call button.  Ask for assistance in getting safely to the toilet.  Ask for a walker or cane to be put at your bedside. Ask that most of the side rails on your bed be placed up before your health care provider leaves the room.  Ask family or friends to sit with you.  Ask for things that are out of your reach, such as your glasses, hearing aids, telephone, bedside table, or call button. Follow these tips to avoid falling:  Stay lying or seated, rather than standing, while waiting for help.  Wear rubber-soled slippers or shoes whenever you walk in the hospital.  Avoid quick, sudden movements.  Change positions slowly.  Sit on the side of your bed before standing.  Stand up slowly and wait before you start to walk.  Let your health care provider know if there is a spill on the floor.  Pay careful attention to the medical equipment, electrical cords, and tubes around you.  When you  need help, use your call button by your bed or in the bathroom. Wait for one of your health care providers to help you.  If you feel dizzy or unsure of your footing, return to bed and wait for assistance.  Avoid being distracted by the TV, telephone, or another person in your room.  Do not lean or support yourself on rolling objects, such as IV poles or bedside tables.   This information is not intended to replace advice given to you by your health care provider. Make sure you discuss any questions you have with your health care provider.   Document Released: 09/29/2000 Document Revised: 10/23/2014 Document Reviewed: 06/09/2012 Elsevier Interactive Patient Education [employer].

## 2015-10-15 NOTE — ED Notes (Signed)
Pt continuing to walk in hallway and attempting to remove c-collar. Requesting to go outside. EDP at bedside dermabonding facial wounds

## 2015-10-15 NOTE — ED Notes (Signed)
Pt ambulatory to discharge with cigarette in mouth. Informed pt no smoking until outside.

## 2015-10-15 NOTE — ED Notes (Signed)
Pt refusing to use urinal, demanding to walk to the bathroom. Pt climbed out of bed, assisted pt to restroom. Towel-made c-collar by EMS replaced with Aspen collar per Dr.Palumbo. Bleeding controlled at this time. Pt c/o "tailbone pain"

## 2015-10-15 NOTE — ED Notes (Signed)
Pt taken to CT.

## 2015-10-15 NOTE — ED Notes (Signed)
Pt removed c-collar and bandage from head; informed pt to leave c-collar in place, bandage replaced.

## 2015-10-15 NOTE — ED Notes (Signed)
Pt at doorway, removed c-collar again, requesting to be discharged.

## 2015-10-15 NOTE — ED Provider Notes (Addendum)
CSN: [id number]     Arrival date & time 10/15/15  0208 History  By signing my name below, I, Helane Gunther, attest that this documentation has been prepared under the direction and in the presence of Jasmeet Gehl, MD. Electronically Signed: Helane Gunther, ED Scribe. 10/15/2015. 2:30 AM.    Chief Complaint  Patient presents with  . Fall   Patient is a 49 y.o. male presenting with fall. The history is provided by the patient. No language interpreter was used.  Fall This is a new problem. The current episode started less than 1 hour ago. The problem occurs constantly. The problem has not changed since onset.Pertinent negatives include no chest pain. Nothing aggravates the symptoms. Nothing relieves the symptoms. He has tried nothing for the symptoms. The treatment provided no relief.   HPI Comments: Level 5 Caveat (Intoxicated and Belligerent) Anthony Moon is a 49 y.o. male former smoker with a PMHx of CAD and HTN brought in by ambulance, who presents to the Emergency Department complaining of a fall that occurred just PTA. Per nurse, pt was drinking (8-9 beers, per pt) and took a taxi home. She states when pt got out of the cab, he tripped and fell face-first, striking his head on the pavement. He does not recall his last tetanus shot.   Past Medical History  Diagnosis Date  . CAD (coronary artery disease) 05/18/10    s/p cath 05/18/2010 with overlapping drug eluting stents LAD.  Marland Kitchen Tobacco abuse     stopped smoking  . Hypertension    Past Surgical History  Procedure Laterality Date  . Hernia repair      s/p double hernia repair  . Broken jaw repair     Family History  Problem Relation Age of Onset  . Coronary artery disease Father    Social History  Substance Use Topics  . Smoking status: Former Smoker -- 35 years  . Smokeless tobacco: None  . Alcohol Use: Yes     Comment: socially    Review of Systems  Unable to perform ROS: Other  Cardiovascular: Negative for chest pain.     Allergies  Review of patient's allergies indicates no known allergies.  Home Medications   Prior to Admission medications   Medication Sig Start Date End Date Taking? Authorizing Provider  acetaminophen (TYLENOL) 325 MG tablet Take 650 mg by mouth every 6 (six) hours as needed.      Historical Provider, MD  aspirin 81 MG tablet Take 1 tablet (81 mg total) by mouth daily. 01/16/11   Burnell Blanks, MD  clopidogrel (PLAVIX) 75 MG tablet Take 1 tablet (75 mg total) by mouth daily. 09/01/15   Burnell Blanks, MD  fluticasone (FLONASE) 50 MCG/ACT nasal spray Place 1 spray into both nostrils daily.  05/23/14   Historical Provider, MD  losartan (COZAAR) 100 MG tablet TAKE 1 TABLET BY MOUTH ('100MG'$  TOTAL) DAILY 09/01/15   Burnell Blanks, MD  metoprolol (LOPRESSOR) 50 MG tablet Take 1 tablet (50 mg total) by mouth 2 (two) times daily. 12/03/14   Burnell Blanks, MD  nitroGLYCERIN (NITROSTAT) 0.4 MG SL tablet Place 1 tablet (0.4 mg total) under the tongue every 5 (five) minutes as needed. 12/03/12   Burnell Blanks, MD  pantoprazole (PROTONIX) 20 MG tablet Take 20 mg by mouth daily. 12/21/13   Linton Flemings, MD  simvastatin (ZOCOR) 40 MG tablet Take 1 tablet (40 mg total) by mouth at bedtime. 09/01/15   Burnell Blanks, MD  BP 94/55 mmHg  Pulse 87  Temp(Src) 98.7 F (37.1 C)  Resp 16  SpO2 92% Physical Exam  Constitutional: He appears well-developed and well-nourished.  HENT:  Head: Head is without raccoon's eyes and without Battle's sign.    Right Ear: No hemotympanum.  Left Ear: No hemotympanum.  Mouth/Throat: Oropharynx is clear and moist.  No hemotympanum bilaterally  Eyes: Conjunctivae and EOM are normal. Pupils are equal, round, and reactive to light. Right eye exhibits no discharge. Left eye exhibits no discharge.  Neck: Normal range of motion. Neck supple.  Cardiovascular: Normal rate, regular rhythm and intact distal pulses.   Pulmonary/Chest:  Effort normal. No respiratory distress. He has no wheezes. He has no rales.  Abdominal: Soft. He exhibits no distension. There is no tenderness. There is no rebound and no guarding.  Musculoskeletal: Normal range of motion. He exhibits no edema.  Neurological: He is alert. He has normal reflexes. Coordination normal.  Skin: Skin is warm and dry. No rash noted. He is not diaphoretic. No erythema.  Psychiatric: He has a normal mood and affect.  Nursing note and vitals reviewed.   ED Course  .Marland KitchenLaceration Repair Date/Time: 10/15/2015 4:17 AM Performed by: Veatrice Kells Authorized by: Veatrice Kells Irrigation solution: saline Irrigation method: syringe Amount of cleaning: standard Debridement: none Degree of undermining: none Skin closure: glue   in both locations Laceration length 1 cm DIAGNOSTIC STUDIES: Oxygen Saturation is 92% on RA, low by my interpretation.    COORDINATION OF CARE: 2:25 AM - Discussed plans to order a Tdap. Pt advised of plan for treatment and pt agrees.  Labs Review Labs Reviewed - No data to display  Imaging Review No results found. I have personally reviewed and evaluated these images and lab results as part of my medical decision-making.   EKG Interpretation   Date/Time:  Friday October 15 2015 02:16:30 EST Ventricular Rate:  83 PR Interval:  172 QRS Duration: 84 QT Interval:  389 QTC Calculation: 457 R Axis:   74 Text Interpretation:  Sinus rhythm Anterior infarct, possibly acute  Borderline ST elevation, inferior leads Lateral leads are also involved  Baseline wander in lead(s) II III aVF Unchanged from 12/21/2013 Confirmed  by DELO  MD, [place] ([id number]) on 10/15/2015 2:20:49 AM      MDM   Final diagnoses:  None    Ambulated in the department.  Follow up with your PMD  I personally performed the services described in this documentation, which was scribed in my presence. The recorded information has been reviewed and is accurate.      Veatrice Kells, MD 10/15/15 3825  Anthony Stager, MD 11/03/15 0539

## 2015-10-15 NOTE — ED Notes (Signed)
Pt to ED via [facility] c/o fall and laceration to R forehead. Pt was out drinking tonight, took a taxi home, and fell face first onto cement after getting out of taxi. Pt is on ASA and Plavix for stents. Denies LOC. Bleeding present through bandage. Pt A&Ox4 on arrival

## 2015-11-24 ENCOUNTER — Other Ambulatory Visit: Payer: Self-pay | Admitting: Cardiovascular Disease

## 2015-11-24 MED ORDER — SIMVASTATIN 40 MG PO TABS: 40.0000 mg | ORAL_TABLET | Freq: Every day | ORAL | Status: DC

## 2015-11-24 MED ORDER — LOSARTAN POTASSIUM 100 MG PO TABS: ORAL_TABLET | ORAL | Status: DC

## 2015-11-24 MED ORDER — CLOPIDOGREL BISULFATE 75 MG PO TABS: 75.0000 mg | ORAL_TABLET | Freq: Every day | ORAL | Status: DC

## 2015-12-02 ENCOUNTER — Ambulatory Visit: Payer: Commercial Indemnity | Admitting: Cardiovascular Disease

## 2015-12-08 ENCOUNTER — Ambulatory Visit: Payer: Self-pay | Admitting: General Surgery

## 2015-12-08 NOTE — H&P (Signed)
History of Present Illness Anthony Spruce MD; 12/08/2015 4:37 PM) Patient words: New-RIH.  The patient is a 50 year old male who presents with an inguinal hernia. Referred by [facility] or recurrent inguinal hernias. Digital-year-old male had open inguinal hernia repairs the age of 16. Over the last 2 months he has had increasing right groin pain. Pain is burning. It does not radiate. It is not associated with food. He does not have any nausea or vomiting. He does have an occasional bulge especially after a long day at work. He has 1 bowel movement per day without straining. He does not have any urinary issues.  He smokes one half pack per day and has tried to quit multiple times with varying success. He does not have diabetes, he does not have HIV or any immunologic diseases.   Other Problems Malachi Bonds, CMA; 12/08/2015 3:26 PM) Congestive Heart Failure High blood pressure Inguinal Hernia Myocardial infarction  Past Surgical History Malachi Bonds, CMA; 12/08/2015 3:26 PM) Open Inguinal Hernia Surgery Bilateral. Valve Replacement  Diagnostic Studies History Malachi Bonds, CMA; 12/08/2015 3:26 PM) Colonoscopy never  Allergies Malachi Bonds, CMA; 12/08/2015 3:26 PM) No Known Drug Allergies02/22/2017  Medication History Malachi Bonds, CMA; 12/08/2015 3:27 PM) Clopidogrel Bisulfate ('75MG'$  Tablet, Oral) Active. Losartan Potassium ('100MG'$  Tablet, Oral) Active. Metoprolol Tartrate ('50MG'$  Tablet, Oral) Active. Pantoprazole Sodium ('20MG'$  Tablet DR, Oral) Active. Simvastatin ('40MG'$  Tablet, Oral) Active. Aspirin ('81MG'$  Tablet, Oral) Active. Nitrostat (0.'4MG'$  Tab Sublingual, Sublingual) Active. Medications Reconciled  Social History Malachi Bonds, CMA; 12/08/2015 3:26 PM) Alcohol use Occasional alcohol use. Caffeine use Coffee. Illicit drug use Remotely quit drug use. Tobacco use Current every day smoker.  Family History Malachi Bonds, [place]; 12/08/2015 3:26  PM) Arthritis Mother. Heart Disease Father.    Review of Systems Malachi Bonds CMA; 12/08/2015 3:26 PM) General Not Present- Appetite Loss, Chills, Fatigue, Fever, Night Sweats, Weight Gain and Weight Loss. Skin Present- Rash. Not Present- Change in Wart/Mole, Dryness, Hives, Jaundice, New Lesions, Non-Healing Wounds and Ulcer. HEENT Not Present- Earache, Hearing Loss, Hoarseness, Nose Bleed, Oral Ulcers, Ringing in the Ears, Seasonal Allergies, Sinus Pain, Sore Throat, Visual Disturbances, Wears glasses/contact lenses and Yellow Eyes. Respiratory Not Present- Bloody sputum, Chronic Cough, Difficulty Breathing, Snoring and Wheezing. Breast Not Present- Breast Mass, Breast Pain, Nipple Discharge and Skin Changes. Cardiovascular Not Present- Chest Pain, Difficulty Breathing Lying Down, Leg Cramps, Palpitations, Rapid Heart Rate, Shortness of Breath and Swelling of Extremities. Gastrointestinal Present- Abdominal Pain. Not Present- Bloating, Bloody Stool, Change in Bowel Habits, Chronic diarrhea, Constipation, Difficulty Swallowing, Excessive gas, Gets full quickly at meals, Hemorrhoids, Indigestion, Nausea, Rectal Pain and Vomiting. Male Genitourinary Present- Nocturia. Not Present- Blood in Urine, Change in Urinary Stream, Frequency, Impotence, Painful Urination, Urgency and Urine Leakage. Musculoskeletal Not Present- Back Pain, Joint Pain, Joint Stiffness, Muscle Pain, Muscle Weakness and Swelling of Extremities. Neurological Not Present- Decreased Memory, Fainting, Headaches, Numbness, Seizures, Tingling, Tremor, Trouble walking and Weakness. Psychiatric Present- Anxiety. Not Present- Bipolar, Change in Sleep Pattern, Depression, Fearful and Frequent crying. Endocrine Not Present- Cold Intolerance, Excessive Hunger, Hair Changes, Heat Intolerance, Hot flashes and New Diabetes. Hematology Present- Easy Bruising. Not Present- Excessive bleeding, Gland problems, HIV and Persistent  Infections.  Vitals (Chemira Jones CMA; 12/08/2015 3:26 PM) 12/08/2015 3:26 PM Weight: 169 lb Height: 69in Body Surface Area: 1.92 m Body Mass Index: 24.96 kg/m  Temp.: 98.51F(Oral)  Pulse: 78 (Regular)  BP: 130/86 (Sitting, Left Arm, Standard)       Physical Exam (Lillion Elbert A.  Berda Shelvin MD; 12/08/2015 4:37 PM) General Mental Status-Alert. General Appearance-Cooperative. Orientation-Oriented X4. Build & Nutrition-Obese. Posture-Normal posture.  Integumentary Global Assessment Normal Exam - Head/Face: no rashes, ulcers, lesions or evidence of photo damage. No palpable nodules or masses and Neck: no visible lesions or palpable masses.  Head and Neck Head-normocephalic, atraumatic with no lesions or palpable masses. Face Global Assessment - atraumatic. Thyroid Gland Characteristics - normal size and consistency.  Eye Eyeball - Bilateral-Extraocular movements intact. Sclera/Conjunctiva - Bilateral-No scleral icterus, No Discharge.  ENMT Nose and Sinuses Nose - no deformities observed, no swelling present.  Chest and Lung Exam Palpation Normal exam - Non-tender. Auscultation Breath sounds - Normal.  Cardiovascular Auscultation Rhythm - Regular. Heart Sounds - S1 WNL and S2 WNL. Carotid arteries - No Carotid bruit.  Abdomen Inspection Normal Exam - No Visible peristalsis, No Abnormal pulsations and No Paradoxical movements. Palpation/Percussion Normal exam - Soft, Non Tender, No Rebound tenderness, No Rigidity (guarding), No hepatosplenomegaly and No Palpable abdominal masses. Note: Palpable bulge on right groin exam, reducible in supine position. 1-1/2 cm defect on the left groin exam.   Peripheral Vascular Upper Extremity Palpation - Pulses bilaterally normal. Lower Extremity Palpation - Edema - Bilateral - No edema.  Neurologic Neurologic evaluation reveals -normal sensation and normal coordination.  Neuropsychiatric Mental  status exam performed with findings of-able to articulate well with normal speech/language, rate, volume and coherence and thought content normal with ability to perform basic computations and apply abstract reasoning.  Musculoskeletal Normal Exam - Bilateral-Upper Extremity Strength Normal and Lower Extremity Strength Normal.    Assessment & Plan Anthony Spruce MD; 12/08/2015 4:39 PM) BILATERAL RECURRENT INGUINAL HERNIA (K40.21) Impression: 50 year old male with recurrent bilateral inguinal hernias. Right leg being more symptomatic than the left. Due to previous open repair and recommended a bilateral laparoscopic hernia repair with mesh. We discussed that smoking increases the likelihood of recurrence and infection risk.  The patient has a symptomatic reducible hernia. We discussed the etiology of his hernia, the risk of it enlarging, incarceration, obstruction, strangulation, and that it is unlikely to get smaller or better on its own. We discussed operative options of laparoscopic vs open repair with mesh including the risks of recurrence, injury to intestines or abominal organs, chronic pain associated with mesh, ischemic orchiditis or testicular injury. We decided to proceed with laparoscopic bilateral inguinal hernia repair. Current Plans You are being scheduled for surgery - Our schedulers will call you.  You should hear from our office's scheduling department within 5 working days about the location, date, and time of surgery. We try to make accommodations for patient's preferences in scheduling surgery, but sometimes the OR schedule or the surgeon's schedule prevents [place] from making those accommodations.  If you have not heard from our office [phone]) in 5 working days, call the office and ask for your surgeon's nurse.  If you have other questions about your diagnosis, plan, or surgery, call the office and ask for your surgeon's nurse.  Pt Education - Pamphlet Given -  Laparoscopic Hernia Repair: discussed with patient and provided information. The anatomy & physiology of the abdominal wall and pelvic floor was discussed. The pathophysiology of hernias in the inguinal and pelvic region was discussed. Natural history risks such as progressive enlargement, pain, incarceration, and strangulation was discussed. Contributors to complications such as smoking, obesity, diabetes, prior surgery, etc were discussed.  I feel the risks of no intervention will lead to serious problems that outweigh the operative risks; therefore, I recommended  surgery to reduce and repair the hernia. I explained laparoscopic techniques with possible need for an open approach. I noted usual use of mesh to patch and/or buttress hernia repair  Risks such as bleeding, infection, abscess, need for further treatment, heart attack, death, and other risks were discussed. I noted a good likelihood this will help address the problem. Goals of post-operative recovery were discussed as well. Possibility that this will not correct all symptoms was explained. I stressed the importance of low-impact activity, aggressive pain control, avoiding constipation, & not pushing through pain to minimize risk of post-operative chronic pain or injury. Possibility of reherniation was discussed. We will work to minimize complications.  An educational handout further explaining the pathology & treatment options was given as well. Questions were answered. The patient expresses understanding & wishes to proceed with surgery.  Started Ibuprofen '800MG'$ , 1 (one) Tablet three times daily, as needed, #40, 12/08/2015, Ref. x1. Started Pantoprazole Sodium '40MG'$ , 1 (one) Tablet daily, #50, 12/08/2015, No Refill.

## 2015-12-16 ENCOUNTER — Encounter (HOSPITAL_BASED_OUTPATIENT_CLINIC_OR_DEPARTMENT_OTHER): Payer: Self-pay | Admitting: *Deleted

## 2015-12-16 NOTE — Progress Notes (Addendum)
NPO AFTER MN.  ARRIVE AT 1000.  NEEDS ISTAT .  CURRENT EKG IN CHART AND EPIC.  WILL TAKE LOPRESSOR, LOSARTAN, AND PROTONIX AM DOS W/ SIPS OF WATER.  WILL DO HIBICLENS SHOWER HS BEFORE AND AM DOS.  REVIEWED CHART W/ DR OSSEY MDA,  OK TO PROCEED.

## 2015-12-21 ENCOUNTER — Encounter (HOSPITAL_BASED_OUTPATIENT_CLINIC_OR_DEPARTMENT_OTHER): Payer: Self-pay | Admitting: *Deleted

## 2015-12-21 ENCOUNTER — Encounter (HOSPITAL_BASED_OUTPATIENT_CLINIC_OR_DEPARTMENT_OTHER)
Admission: RE | Disposition: A | Discharge status: Home / Self Care | Payer: Self-pay | Source: Ambulatory Visit | Attending: General Surgery

## 2015-12-21 ENCOUNTER — Ambulatory Visit (HOSPITAL_BASED_OUTPATIENT_CLINIC_OR_DEPARTMENT_OTHER)
Admission: RE | Admit: 2015-12-21 | Discharge: 2015-12-21 | Disposition: A | Discharge status: Home / Self Care | Payer: Managed Care, Other (non HMO) | Source: Ambulatory Visit | Attending: General Surgery | Admitting: General Surgery

## 2015-12-21 ENCOUNTER — Ambulatory Visit (HOSPITAL_BASED_OUTPATIENT_CLINIC_OR_DEPARTMENT_OTHER): Payer: Managed Care, Other (non HMO) | Admitting: Anesthesiology

## 2015-12-21 DIAGNOSIS — Z7982 Long term (current) use of aspirin: Secondary | ICD-10-CM | POA: Insufficient documentation

## 2015-12-21 DIAGNOSIS — K4020 Bilateral inguinal hernia, without obstruction or gangrene, not specified as recurrent: Secondary | ICD-10-CM | POA: Insufficient documentation

## 2015-12-21 DIAGNOSIS — Z79899 Other long term (current) drug therapy: Secondary | ICD-10-CM | POA: Diagnosis not present

## 2015-12-21 DIAGNOSIS — K219 Gastro-esophageal reflux disease without esophagitis: Secondary | ICD-10-CM | POA: Insufficient documentation

## 2015-12-21 DIAGNOSIS — I252 Old myocardial infarction: Secondary | ICD-10-CM | POA: Insufficient documentation

## 2015-12-21 DIAGNOSIS — F17210 Nicotine dependence, cigarettes, uncomplicated: Secondary | ICD-10-CM | POA: Diagnosis not present

## 2015-12-21 DIAGNOSIS — I2510 Atherosclerotic heart disease of native coronary artery without angina pectoris: Secondary | ICD-10-CM | POA: Insufficient documentation

## 2015-12-21 DIAGNOSIS — I10 Essential (primary) hypertension: Secondary | ICD-10-CM | POA: Insufficient documentation

## 2015-12-21 DIAGNOSIS — Z952 Presence of prosthetic heart valve: Secondary | ICD-10-CM | POA: Insufficient documentation

## 2015-12-21 HISTORY — DX: Old myocardial infarction: I25.2

## 2015-12-21 HISTORY — DX: Gastro-esophageal reflux disease without esophagitis: K21.9

## 2015-12-21 HISTORY — DX: Other cardiomyopathies: I42.8

## 2015-12-21 HISTORY — DX: Bilateral inguinal hernia, without obstruction or gangrene, recurrent: K40.21

## 2015-12-21 HISTORY — DX: Personal history of other diseases of the circulatory system: Z86.79

## 2015-12-21 HISTORY — DX: Rash and other nonspecific skin eruption: R21

## 2015-12-21 HISTORY — DX: Simple chronic bronchitis: J41.0

## 2015-12-21 HISTORY — DX: Other seasonal allergic rhinitis: J30.2

## 2015-12-21 HISTORY — DX: Presence of coronary angioplasty implant and graft: Z95.5

## 2015-12-21 HISTORY — PX: INGUINAL HERNIA REPAIR: SHX194

## 2015-12-21 LAB — POCT I-STAT 4, (NA,K, GLUC, HGB,HCT)
Glucose, Bld: 106 mg/dL — ABNORMAL HIGH (ref 65–99)
HCT: 48 % (ref 39.0–52.0)
HEMOGLOBIN: 16.3 g/dL (ref 13.0–17.0)
POTASSIUM: 3.8 mmol/L (ref 3.5–5.1)
Sodium: 138 mmol/L (ref 135–145)

## 2015-12-21 SURGERY — REPAIR, HERNIA, INGUINAL, BILATERAL, LAPAROSCOPIC
Anesthesia: General | Laterality: Bilateral

## 2015-12-21 MED ORDER — FENTANYL CITRATE (PF) 100 MCG/2ML IJ SOLN
INTRAMUSCULAR | Status: AC
Start: 2015-12-21 — End: 2015-12-21
  Filled 2015-12-21: qty 2

## 2015-12-21 MED ORDER — OXYCODONE-ACETAMINOPHEN 5-325 MG PO TABS: 1.0000 | ORAL_TABLET | ORAL | Status: DC | PRN

## 2015-12-21 MED ORDER — DEXAMETHASONE SODIUM PHOSPHATE 4 MG/ML IJ SOLN
INTRAMUSCULAR | Status: DC | PRN
  Administered 2015-12-21: 10 mg via INTRAVENOUS

## 2015-12-21 MED ORDER — OXYCODONE HCL 5 MG PO TABS
ORAL_TABLET | ORAL | Status: AC
  Filled 2015-12-21: qty 1

## 2015-12-21 MED ORDER — PROPOFOL 10 MG/ML IV BOLUS
INTRAVENOUS | Status: AC
  Filled 2015-12-21: qty 20

## 2015-12-21 MED ORDER — ALBUTEROL SULFATE HFA 108 (90 BASE) MCG/ACT IN AERS
INHALATION_SPRAY | RESPIRATORY_TRACT | Status: AC
  Filled 2015-12-21: qty 13.4

## 2015-12-21 MED ORDER — KETOROLAC TROMETHAMINE 30 MG/ML IJ SOLN
INTRAMUSCULAR | Status: AC
  Filled 2015-12-21: qty 1

## 2015-12-21 MED ORDER — CHLORHEXIDINE GLUCONATE 4 % EX LIQD
1.0000 "application " | Freq: Once | CUTANEOUS | Status: DC
  Filled 2015-12-21: qty 15

## 2015-12-21 MED ORDER — MIDAZOLAM HCL 5 MG/5ML IJ SOLN
INTRAMUSCULAR | Status: DC | PRN
  Administered 2015-12-21: 2 mg via INTRAVENOUS

## 2015-12-21 MED ORDER — FENTANYL CITRATE (PF) 100 MCG/2ML IJ SOLN
INTRAMUSCULAR | Status: AC
  Filled 2015-12-21: qty 2

## 2015-12-21 MED ORDER — ROCURONIUM BROMIDE 100 MG/10ML IV SOLN
INTRAVENOUS | Status: DC | PRN
  Administered 2015-12-21: 20 mg via INTRAVENOUS

## 2015-12-21 MED ORDER — ONDANSETRON HCL 4 MG/2ML IJ SOLN
INTRAMUSCULAR | Status: AC
  Filled 2015-12-21: qty 2

## 2015-12-21 MED ORDER — KETOROLAC TROMETHAMINE 30 MG/ML IJ SOLN
INTRAMUSCULAR | Status: DC | PRN
  Administered 2015-12-21: 30 mg via INTRAVENOUS

## 2015-12-21 MED ORDER — LIDOCAINE HCL (CARDIAC) 20 MG/ML IV SOLN
INTRAVENOUS | Status: AC
  Filled 2015-12-21: qty 5

## 2015-12-21 MED ORDER — PROMETHAZINE HCL 25 MG/ML IJ SOLN
6.2500 mg | INTRAMUSCULAR | Status: DC | PRN
  Filled 2015-12-21: qty 1

## 2015-12-21 MED ORDER — NEOSTIGMINE METHYLSULFATE 10 MG/10ML IV SOLN
INTRAVENOUS | Status: DC | PRN
  Administered 2015-12-21: 1 mg via INTRAVENOUS

## 2015-12-21 MED ORDER — MIDAZOLAM HCL 2 MG/2ML IJ SOLN
INTRAMUSCULAR | Status: AC
  Filled 2015-12-21: qty 2

## 2015-12-21 MED ORDER — DEXAMETHASONE SODIUM PHOSPHATE 10 MG/ML IJ SOLN
INTRAMUSCULAR | Status: AC
  Filled 2015-12-21: qty 1

## 2015-12-21 MED ORDER — FENTANYL CITRATE (PF) 100 MCG/2ML IJ SOLN
INTRAMUSCULAR | Status: DC | PRN
  Administered 2015-12-21: 50 ug via INTRAVENOUS
  Administered 2015-12-21: 100 ug via INTRAVENOUS
  Administered 2015-12-21 (×2): 50 ug via INTRAVENOUS

## 2015-12-21 MED ORDER — MEPERIDINE HCL 25 MG/ML IJ SOLN
6.2500 mg | INTRAMUSCULAR | Status: DC | PRN
  Filled 2015-12-21: qty 1

## 2015-12-21 MED ORDER — SUCCINYLCHOLINE CHLORIDE 20 MG/ML IJ SOLN
INTRAMUSCULAR | Status: DC | PRN
  Administered 2015-12-21: 100 mg via INTRAVENOUS

## 2015-12-21 MED ORDER — SODIUM CHLORIDE 0.9 % IV SOLN
INTRAVENOUS | Status: DC | PRN
  Administered 2015-12-21 (×4): 10 mL

## 2015-12-21 MED ORDER — ALBUTEROL SULFATE HFA 108 (90 BASE) MCG/ACT IN AERS
INHALATION_SPRAY | RESPIRATORY_TRACT | Status: DC | PRN
  Administered 2015-12-21: 4 via RESPIRATORY_TRACT

## 2015-12-21 MED ORDER — GLYCOPYRROLATE 0.2 MG/ML IJ SOLN
INTRAMUSCULAR | Status: AC
  Filled 2015-12-21: qty 1

## 2015-12-21 MED ORDER — ONDANSETRON HCL 4 MG/2ML IJ SOLN
INTRAMUSCULAR | Status: DC | PRN
  Administered 2015-12-21: 4 mg via INTRAVENOUS

## 2015-12-21 MED ORDER — LIDOCAINE HCL (CARDIAC) 20 MG/ML IV SOLN
INTRAVENOUS | Status: DC | PRN
  Administered 2015-12-21: 100 mg via INTRAVENOUS

## 2015-12-21 MED ORDER — CEFAZOLIN SODIUM-DEXTROSE 2-3 GM-% IV SOLR
2.0000 g | INTRAVENOUS | Status: AC
  Administered 2015-12-21: 2 g via INTRAVENOUS
  Filled 2015-12-21: qty 50

## 2015-12-21 MED ORDER — LACTATED RINGERS IV SOLN
INTRAVENOUS | Status: DC
  Administered 2015-12-21: 11:00:00 via INTRAVENOUS
  Filled 2015-12-21: qty 1000

## 2015-12-21 MED ORDER — PROPOFOL 10 MG/ML IV BOLUS
INTRAVENOUS | Status: DC | PRN
  Administered 2015-12-21: 150 mg via INTRAVENOUS
  Administered 2015-12-21: 50 mg via INTRAVENOUS

## 2015-12-21 MED ORDER — CEFAZOLIN SODIUM-DEXTROSE 2-3 GM-% IV SOLR
INTRAVENOUS | Status: AC
  Filled 2015-12-21: qty 50

## 2015-12-21 MED ORDER — SODIUM CHLORIDE 0.9 % IR SOLN
Status: DC | PRN
  Administered 2015-12-21: 500 mL

## 2015-12-21 MED ORDER — OXYCODONE HCL 5 MG PO TABS
5.0000 mg | ORAL_TABLET | Freq: Once | ORAL | Status: AC | PRN
  Administered 2015-12-21: 5 mg via ORAL
  Filled 2015-12-21: qty 1

## 2015-12-21 MED ORDER — HYDROMORPHONE HCL 1 MG/ML IJ SOLN
0.2500 mg | INTRAMUSCULAR | Status: DC | PRN
  Filled 2015-12-21: qty 1

## 2015-12-21 MED ORDER — OXYCODONE HCL 5 MG/5ML PO SOLN
5.0000 mg | Freq: Once | ORAL | Status: AC | PRN
  Filled 2015-12-21: qty 5

## 2015-12-21 MED ORDER — GLYCOPYRROLATE 0.2 MG/ML IJ SOLN
INTRAMUSCULAR | Status: DC | PRN
  Administered 2015-12-21: 0.2 mg via INTRAVENOUS

## 2015-12-21 MED FILL — OXYCODONE/APAP 5/325MG: 5-325 | 5 days supply | Qty: 30 | Fill #0

## 2015-12-21 SURGICAL SUPPLY — 40 items
BLADE 11 SAFETY STRL DISP (BLADE) ×3 IMPLANT
BLADE CLIPPER SENSICLIP SURGIC (BLADE) ×3 IMPLANT
CHLORAPREP W/TINT 26ML (MISCELLANEOUS) ×3 IMPLANT
COVER BACK TABLE 60X90IN (DRAPES) ×3 IMPLANT
COVER MAYO STAND STRL (DRAPES) ×3 IMPLANT
DECANTER SPIKE VIAL GLASS SM (MISCELLANEOUS) ×3 IMPLANT
DEVICE SECURE STRAP 25 ABSORB (INSTRUMENTS) ×3 IMPLANT
DISSECT BALLN SPACEMKR + OVL (BALLOONS) ×3
DISSECTOR BALLN SPACEMKR + OVL (BALLOONS) ×1 IMPLANT
DRAPE LAPAROSCOPIC ABDOMINAL (DRAPES) ×3 IMPLANT
DRAPE UTILITY XL STRL (DRAPES) ×3 IMPLANT
ELECT REM PT RETURN 9FT ADLT (ELECTROSURGICAL) ×3
ELECTRODE REM PT RTRN 9FT ADLT (ELECTROSURGICAL) ×1 IMPLANT
GLOVE BIO SURGEON STRL SZ 6.5 (GLOVE) ×2 IMPLANT
GLOVE BIO SURGEONS STRL SZ 6.5 (GLOVE) ×1
GLOVE BIOGEL PI IND STRL 6.5 (GLOVE) ×1 IMPLANT
GLOVE BIOGEL PI IND STRL 7.0 (GLOVE) ×1 IMPLANT
GLOVE BIOGEL PI INDICATOR 6.5 (GLOVE) ×2
GLOVE BIOGEL PI INDICATOR 7.0 (GLOVE) ×2
GLOVE SURG SS PI 7.0 STRL IVOR (GLOVE) ×3 IMPLANT
GLOVE SURG SS PI 7.5 STRL IVOR (GLOVE) ×3 IMPLANT
GOWN STRL REUS W/TWL LRG LVL3 (GOWN DISPOSABLE) ×3 IMPLANT
LIQUID BAND (GAUZE/BANDAGES/DRESSINGS) ×3 IMPLANT
MESH 3DMAX LIGHT 4.1X6.2 LT LR (Mesh General) ×3 IMPLANT
MESH 3DMAX LIGHT 4.1X6.2 RT LR (Mesh General) ×3 IMPLANT
NEEDLE HYPO 25X1 1.5 SAFETY (NEEDLE) ×3 IMPLANT
PACK BASIN DAY SURGERY FS (CUSTOM PROCEDURE TRAY) ×3 IMPLANT
PADDING ION DISPOSABLE (MISCELLANEOUS) ×3 IMPLANT
SCISSORS LAP 5X35 DISP (ENDOMECHANICALS) IMPLANT
SET IRRIG TUBING LAPAROSCOPIC (IRRIGATION / IRRIGATOR) IMPLANT
SHEARS HARMONIC ACE PLUS 36CM (ENDOMECHANICALS) IMPLANT
SOLUTION ANTI FOG 6CC (MISCELLANEOUS) ×3 IMPLANT
SUT MNCRL AB 4-0 PS2 18 (SUTURE) ×3 IMPLANT
SUT VIC AB 2-0 SH 27 (SUTURE)
SUT VIC AB 2-0 SH 27X BRD (SUTURE) IMPLANT
SUT VICRYL 0 UR6 27IN ABS (SUTURE) ×3 IMPLANT
SYR CONTROL 10ML LL (SYRINGE) ×3 IMPLANT
TOWEL OR 17X24 6PK STRL BLUE (TOWEL DISPOSABLE) ×6 IMPLANT
TROCAR CANNULA W/PORT DUAL 5MM (MISCELLANEOUS) ×3 IMPLANT
TUBING INSUF HEATED (TUBING) ×3 IMPLANT

## 2015-12-21 NOTE — Interval H&P Note (Signed)
History and Physical Interval Note:  I have evaluated the patient and reviewed the recent history and physical. There are no changes to his current health presentation.   12/21/2015 10:52 AM  Anthony Moon  has presented today for surgery, with the diagnosis of bilateral recurrent ingunial hernia   The various methods of treatment have been discussed with the patient and family. After consideration of risks, benefits and other options for treatment, the patient has consented to  Procedure(s): LAPAROSCOPIC BILATERAL INGUINAL HERNIA REPAIR W/MESH  (Bilateral) INSERTION OF MESH (Bilateral) as a surgical intervention .  The patient's history has been reviewed, patient examined, no change in status, stable for surgery.  I have reviewed the patient's chart and labs.  Questions were answered to the patient's satisfaction.     Arta Bruce Anthony Moon

## 2015-12-21 NOTE — Anesthesia Preprocedure Evaluation (Addendum)
Anesthesia Evaluation  Patient identified by MRN, date of birth, ID band Patient awake    Reviewed: Allergy & Precautions, NPO status , Patient's Chart, lab work & pertinent test results  Airway Mallampati: II  TM Distance: >3 FB Neck ROM: Full    Dental no notable dental hx.    Pulmonary Current Smoker,    Pulmonary exam normal breath sounds clear to auscultation       Cardiovascular hypertension, Pt. on medications + CAD and + Past MI  Normal cardiovascular exam Rhythm:Regular Rate:Normal     Neuro/Psych PSYCHIATRIC DISORDERS negative neurological ROS     GI/Hepatic Neg liver ROS, GERD  ,  Endo/Other  negative endocrine ROS  Renal/GU negative Renal ROS     Musculoskeletal negative musculoskeletal ROS (+)   Abdominal   Peds  Hematology negative hematology ROS (+)   Anesthesia Other Findings   Reproductive/Obstetrics                            Anesthesia Physical Anesthesia Plan  ASA: III  Anesthesia Plan: General   Post-op Pain Management:    Induction: Intravenous  Airway Management Planned: Oral ETT  Additional Equipment:   Intra-op Plan:   Post-operative Plan: Extubation in OR  Informed Consent: I have reviewed the patients History and Physical, chart, labs and discussed the procedure including the risks, benefits and alternatives for the proposed anesthesia with the patient or authorized representative who has indicated his/her understanding and acceptance.   Dental advisory given  Plan Discussed with: CRNA  Anesthesia Plan Comments:         Anesthesia Quick Evaluation

## 2015-12-21 NOTE — H&P (View-Only) (Signed)
History of Present Illness Anthony Spruce MD; 12/08/2015 4:37 PM) Patient words: New-RIH.  The patient is a 50 year old male who presents with an inguinal hernia. Referred by London Pepper or recurrent inguinal hernias. Digital-year-old male had open inguinal hernia repairs the age of 67. Over the last 2 months he has had increasing right groin pain. Pain is burning. It does not radiate. It is not associated with food. He does not have any nausea or vomiting. He does have an occasional bulge especially after a long day at work. He has 1 bowel movement per day without straining. He does not have any urinary issues.  He smokes one half pack per day and has tried to quit multiple times with varying success. He does not have diabetes, he does not have HIV or any immunologic diseases.   Other Problems Malachi Bonds, CMA; 12/08/2015 3:26 PM) Congestive Heart Failure High blood pressure Inguinal Hernia Myocardial infarction  Past Surgical History Malachi Bonds, CMA; 12/08/2015 3:26 PM) Open Inguinal Hernia Surgery Bilateral. Valve Replacement  Diagnostic Studies History Malachi Bonds, CMA; 12/08/2015 3:26 PM) Colonoscopy never  Allergies Malachi Bonds, CMA; 12/08/2015 3:26 PM) No Known Drug Allergies02/22/2017  Medication History Malachi Bonds, CMA; 12/08/2015 3:27 PM) Clopidogrel Bisulfate ('75MG'$  Tablet, Oral) Active. Losartan Potassium ('100MG'$  Tablet, Oral) Active. Metoprolol Tartrate ('50MG'$  Tablet, Oral) Active. Pantoprazole Sodium ('20MG'$  Tablet DR, Oral) Active. Simvastatin ('40MG'$  Tablet, Oral) Active. Aspirin ('81MG'$  Tablet, Oral) Active. Nitrostat (0.'4MG'$  Tab Sublingual, Sublingual) Active. Medications Reconciled  Social History Malachi Bonds, CMA; 12/08/2015 3:26 PM) Alcohol use Occasional alcohol use. Caffeine use Coffee. Illicit drug use Remotely quit drug use. Tobacco use Current every day smoker.  Family History Malachi Bonds, [place]; 12/08/2015 3:26  PM) Arthritis Mother. Heart Disease Father.    Review of Systems Malachi Bonds CMA; 12/08/2015 3:26 PM) General Not Present- Appetite Loss, Chills, Fatigue, Fever, Night Sweats, Weight Gain and Weight Loss. Skin Present- Rash. Not Present- Change in Wart/Mole, Dryness, Hives, Jaundice, New Lesions, Non-Healing Wounds and Ulcer. HEENT Not Present- Earache, Hearing Loss, Hoarseness, Nose Bleed, Oral Ulcers, Ringing in the Ears, Seasonal Allergies, Sinus Pain, Sore Throat, Visual Disturbances, Wears glasses/contact lenses and Yellow Eyes. Respiratory Not Present- Bloody sputum, Chronic Cough, Difficulty Breathing, Snoring and Wheezing. Breast Not Present- Breast Mass, Breast Pain, Nipple Discharge and Skin Changes. Cardiovascular Not Present- Chest Pain, Difficulty Breathing Lying Down, Leg Cramps, Palpitations, Rapid Heart Rate, Shortness of Breath and Swelling of Extremities. Gastrointestinal Present- Abdominal Pain. Not Present- Bloating, Bloody Stool, Change in Bowel Habits, Chronic diarrhea, Constipation, Difficulty Swallowing, Excessive gas, Gets full quickly at meals, Hemorrhoids, Indigestion, Nausea, Rectal Pain and Vomiting. Male Genitourinary Present- Nocturia. Not Present- Blood in Urine, Change in Urinary Stream, Frequency, Impotence, Painful Urination, Urgency and Urine Leakage. Musculoskeletal Not Present- Back Pain, Joint Pain, Joint Stiffness, Muscle Pain, Muscle Weakness and Swelling of Extremities. Neurological Not Present- Decreased Memory, Fainting, Headaches, Numbness, Seizures, Tingling, Tremor, Trouble walking and Weakness. Psychiatric Present- Anxiety. Not Present- Bipolar, Change in Sleep Pattern, Depression, Fearful and Frequent crying. Endocrine Not Present- Cold Intolerance, Excessive Hunger, Hair Changes, Heat Intolerance, Hot flashes and New Diabetes. Hematology Present- Easy Bruising. Not Present- Excessive bleeding, Gland problems, HIV and Persistent  Infections.  Vitals (Chemira Jones CMA; 12/08/2015 3:26 PM) 12/08/2015 3:26 PM Weight: 169 lb Height: 69in Body Surface Area: 1.92 m Body Mass Index: 24.96 kg/m  Temp.: 98.78F(Oral)  Pulse: 78 (Regular)  BP: 130/86 (Sitting, Left Arm, Standard)       Physical Exam (Yago Ludvigsen A.  Mung Rinker MD; 12/08/2015 4:37 PM) General Mental Status-Alert. General Appearance-Cooperative. Orientation-Oriented X4. Build & Nutrition-Obese. Posture-Normal posture.  Integumentary Global Assessment Normal Exam - Head/Face: no rashes, ulcers, lesions or evidence of photo damage. No palpable nodules or masses and Neck: no visible lesions or palpable masses.  Head and Neck Head-normocephalic, atraumatic with no lesions or palpable masses. Face Global Assessment - atraumatic. Thyroid Gland Characteristics - normal size and consistency.  Eye Eyeball - Bilateral-Extraocular movements intact. Sclera/Conjunctiva - Bilateral-No scleral icterus, No Discharge.  ENMT Nose and Sinuses Nose - no deformities observed, no swelling present.  Chest and Lung Exam Palpation Normal exam - Non-tender. Auscultation Breath sounds - Normal.  Cardiovascular Auscultation Rhythm - Regular. Heart Sounds - S1 WNL and S2 WNL. Carotid arteries - No Carotid bruit.  Abdomen Inspection Normal Exam - No Visible peristalsis, No Abnormal pulsations and No Paradoxical movements. Palpation/Percussion Normal exam - Soft, Non Tender, No Rebound tenderness, No Rigidity (guarding), No hepatosplenomegaly and No Palpable abdominal masses. Note: Palpable bulge on right groin exam, reducible in supine position. 1-1/2 cm defect on the left groin exam.   Peripheral Vascular Upper Extremity Palpation - Pulses bilaterally normal. Lower Extremity Palpation - Edema - Bilateral - No edema.  Neurologic Neurologic evaluation reveals -normal sensation and normal coordination.  Neuropsychiatric Mental  status exam performed with findings of-able to articulate well with normal speech/language, rate, volume and coherence and thought content normal with ability to perform basic computations and apply abstract reasoning.  Musculoskeletal Normal Exam - Bilateral-Upper Extremity Strength Normal and Lower Extremity Strength Normal.    Assessment & Plan Anthony Spruce MD; 12/08/2015 4:39 PM) BILATERAL RECURRENT INGUINAL HERNIA (K40.21) Impression: 50 year old male with recurrent bilateral inguinal hernias. Right leg being more symptomatic than the left. Due to previous open repair and recommended a bilateral laparoscopic hernia repair with mesh. We discussed that smoking increases the likelihood of recurrence and infection risk.  The patient has a symptomatic reducible hernia. We discussed the etiology of his hernia, the risk of it enlarging, incarceration, obstruction, strangulation, and that it is unlikely to get smaller or better on its own. We discussed operative options of laparoscopic vs open repair with mesh including the risks of recurrence, injury to intestines or abominal organs, chronic pain associated with mesh, ischemic orchiditis or testicular injury. We decided to proceed with laparoscopic bilateral inguinal hernia repair. Current Plans You are being scheduled for surgery - Our schedulers will call you.  You should hear from our office's scheduling department within 5 working days about the location, date, and time of surgery. We try to make accommodations for patient's preferences in scheduling surgery, but sometimes the OR schedule or the surgeon's schedule prevents [place] from making those accommodations.  If you have not heard from our office [phone]) in 5 working days, call the office and ask for your surgeon's nurse.  If you have other questions about your diagnosis, plan, or surgery, call the office and ask for your surgeon's nurse.  Pt Education - Pamphlet Given -  Laparoscopic Hernia Repair: discussed with patient and provided information. The anatomy & physiology of the abdominal wall and pelvic floor was discussed. The pathophysiology of hernias in the inguinal and pelvic region was discussed. Natural history risks such as progressive enlargement, pain, incarceration, and strangulation was discussed. Contributors to complications such as smoking, obesity, diabetes, prior surgery, etc were discussed.  I feel the risks of no intervention will lead to serious problems that outweigh the operative risks; therefore, I recommended  surgery to reduce and repair the hernia. I explained laparoscopic techniques with possible need for an open approach. I noted usual use of mesh to patch and/or buttress hernia repair  Risks such as bleeding, infection, abscess, need for further treatment, heart attack, death, and other risks were discussed. I noted a good likelihood this will help address the problem. Goals of post-operative recovery were discussed as well. Possibility that this will not correct all symptoms was explained. I stressed the importance of low-impact activity, aggressive pain control, avoiding constipation, & not pushing through pain to minimize risk of post-operative chronic pain or injury. Possibility of reherniation was discussed. We will work to minimize complications.  An educational handout further explaining the pathology & treatment options was given as well. Questions were answered. The patient expresses understanding & wishes to proceed with surgery.  Started Ibuprofen '800MG'$ , 1 (one) Tablet three times daily, as needed, #40, 12/08/2015, Ref. x1. Started Pantoprazole Sodium '40MG'$ , 1 (one) Tablet daily, #50, 12/08/2015, No Refill.

## 2015-12-21 NOTE — Anesthesia Procedure Notes (Signed)
Procedure Name: Intubation Date/Time: 12/21/2015 11:19 AM Performed by: Bethena Roys T Pre-anesthesia Checklist: Patient identified, Emergency Drugs available, Suction available and Patient being monitored Patient Re-evaluated:Patient Re-evaluated prior to inductionOxygen Delivery Method: Circle System Utilized Preoxygenation: Pre-oxygenation with 100% oxygen Intubation Type: IV induction Ventilation: Mask ventilation without difficulty Laryngoscope Size: Mac and 4 Grade View: Grade II Tube type: Oral Number of attempts: 1 Airway Equipment and Method: Stylet Placement Confirmation: ETT inserted through vocal cords under direct vision,  positive ETCO2 and breath sounds checked- equal and bilateral Secured at: 22 cm Tube secured with: Tape Dental Injury: Teeth and Oropharynx as per pre-operative assessment

## 2015-12-21 NOTE — Transfer of Care (Signed)
Immediate Anesthesia Transfer of Care Note  Patient: Anthony Moon  Procedure(s) Performed: Procedure(s): LAPAROSCOPIC BILATERAL INGUINAL HERNIA REPAIR W/MESH  (Bilateral)  Patient Location: PACU  Anesthesia Type:General  Level of Consciousness: awake, alert  and oriented  Airway & Oxygen Therapy: Patient Spontanous Breathing and Patient connected to nasal cannula oxygen  Post-op Assessment: Report given to RN  Post vital signs: Reviewed and stable  Last Vitals: 154/84, 62, 14, 99% Filed Vitals:   12/21/15 1001  BP: 155/91  Pulse: 77  Temp: 36.8 C  Resp: 16    Complications: No apparent anesthesia complications

## 2015-12-21 NOTE — Op Note (Signed)
Preop diagnosis: bilateral recurrent inguinal hernia  Postop diagnosis: bilateral recurrent inguinal hernia  Procedure: laparoscopic Bilateral inguinal hernia repair with mesh (TEP)  Surgeon: Gurney Maxin, M.D.  Asst: none  Anesthesia: Gen.   Indications for procedure: Anthony Moon is a 50 y.o. male with symptoms of pain and enlarging Bilateral inguinal hernia(s). After discussing risks, alternatives and benefits he decided on laparoscopic repair and was brought to day surgery for repair.  Description of procedure: The patient was brought into the operative suite, placed supine. Anesthesia was administered with endotracheal tube. Patient was strapped in place. Both arms were tucked. All pressure points were offloaded by foam padding. The patient was prepped and draped in the usual sterile fashion.  Lateral incision was made to the left of the umbilicus. The subcu was bluntly dissected down to the anterior resection fascia which was sharply incised. The rectus muscle was mobilized laterally and a 57m balloon trocar was inserted without resistance. The laparoscope was inserted and the balloon was inflated to separate the planes. Next the balloon was deflated and removed. CO2 was applied. 2 552mtrocars were placed, in the inferior midline. All trocars sites were first anesthesized with 1:1 Exparel: Saline. Next the patient was placed in trendelenberg.   Blunt dissection began on the right moving laterally to identify the ASIS and then moved medially to identify the inferior epigastric vessels and indirect hernia space. There was an obvious hernia sac in the space. The sac was dissected away from the vessels and completely reduced. Next the medial space was dissected to identify the pubic bone and cooper's ligament.  Blunt dissection began on the right moving laterally to identify the ASIS and then moved medially to identify the inferior epigastric vessels and indirect hernia space. The sac was  reduced prior to manipulation and was further freed from surrounding tissue. Next the medial space was dissected to identify the pubic bone and cooper's ligament.   On the left, 3D max light weight mesh was inserted and tacked medially to the lacunar ligament. The mesh was positioned flat and directly up against the direct and indirect areas. On the right, 3D max light weight mesh was inserted and tacked medially to the lacunar ligament and laterally above ASIS. The CO2 was evacuated while watching to ensure the mesh did not migrate. The remainder of the Exparel mix was infused 2cm medial and 2cm inferior to the ASIS deep to the fascia. The anterior rectus fascia was closed with 0 vicryl in interrupted sutures and all skin incisions were closed with 4-0 monocryl subcu stitch. The patient awaoke from anesthesia and was brought to PACU in stable condition.  Findings: bilateral indirect (recurrent) inguinal hernia  Specimen: none  Blood loss: 20 ml  Local anesthesia: 40 ml 1:1 Exparel:Saline  Complications: none  Implant: right 3D max light large mesh, left 3D max light large mesh  LuGurney MaxinM.D. General, Bariatric, & Minimally Invasive Surgery [facility], PAUtah2:41 PM 12/21/2015

## 2015-12-21 NOTE — Discharge Instructions (Addendum)
Restart plavix in 2 days  Post Anesthesia Home Care Instructions  Activity: Get plenty of rest for the remainder of the day. A responsible adult should stay with you for 24 hours following the procedure.  For the next 24 hours, DO NOT: -Drive a car -Paediatric nurse -Drink alcoholic beverages -Take any medication unless instructed by your physician -Make any legal decisions or sign important papers.  Meals: Start with liquid foods such as gelatin or soup. Progress to regular foods as tolerated. Avoid greasy, spicy, heavy foods. If nausea and/or vomiting occur, drink only clear liquids until the nausea and/or vomiting subsides. Call your physician if vomiting continues.  Special Instructions/Symptoms: Your throat may feel dry or sore from the anesthesia or the breathing tube placed in your throat during surgery. If this causes discomfort, gargle with warm salt water. The discomfort should disappear within 24 hours.  If you had a scopolamine patch placed behind your ear for the management of post- operative nausea and/or vomiting:  1. The medication in the patch is effective for 72 hours, after which it should be removed.  Wrap patch in a tissue and discard in the trash. Wash hands thoroughly with soap and water. 2. You may remove the patch earlier than 72 hours if you experience unpleasant side effects which may include dry mouth, dizziness or visual disturbances. 3. Avoid touching the patch. Wash your hands with soap and water after contact with the patch.   Information for Discharge Teaching: EXPAREL (bupivacaine liposome injectable suspension)   Your surgeon gave you EXPAREL(bupivacaine) in your surgical incision to help control your pain after surgery.   EXPAREL is a local anesthetic that provides pain relief by numbing the tissue around the surgical site.  EXPAREL is designed to release pain medication over time and can control pain for up to 72 hours.  Depending on how you  respond to EXPAREL, you may require less pain medication during your recovery.  Possible side effects:  Temporary loss of sensation or ability to move in the area where bupivacaine was injected.  Nausea, vomiting, constipation  Rarely, numbness and tingling in your mouth or lips, lightheadedness, or anxiety may occur.  Call your doctor right away if you think you may be experiencing any of these sensations, or if you have other questions regarding possible side effects.  Follow all other discharge instructions given to you by your surgeon or nurse. Eat a healthy diet and drink plenty of water or other fluids.  If you return to the hospital for any reason within 96 hours following the administration of EXPAREL, please inform your health care providers.

## 2015-12-22 ENCOUNTER — Encounter (HOSPITAL_BASED_OUTPATIENT_CLINIC_OR_DEPARTMENT_OTHER): Payer: Self-pay | Admitting: General Surgery

## 2015-12-22 NOTE — Anesthesia Postprocedure Evaluation (Signed)
Anesthesia Post Note  Patient: Anthony Moon  Procedure(s) Performed: Procedure(s) (LRB): LAPAROSCOPIC BILATERAL INGUINAL HERNIA REPAIR W/MESH  (Bilateral)  Patient location during evaluation: PACU Anesthesia Type: General Level of consciousness: sedated and patient cooperative Pain management: pain level controlled Vital Signs Assessment: post-procedure vital signs reviewed and stable Respiratory status: spontaneous breathing Cardiovascular status: stable Anesthetic complications: no    Last Vitals:  Filed Vitals:   12/21/15 1330 12/21/15 1438  BP: 136/80 135/82  Pulse: 68 66  Temp:  36.5 C  Resp: 16 14    Last Pain:  Filed Vitals:   12/21/15 1501  PainSc: [place]

## 2016-01-19 ENCOUNTER — Encounter: Payer: Self-pay | Admitting: Cardiovascular Disease

## 2016-01-19 ENCOUNTER — Ambulatory Visit (INDEPENDENT_AMBULATORY_CARE_PROVIDER_SITE_OTHER): Payer: Managed Care, Other (non HMO) | Admitting: Cardiovascular Disease

## 2016-01-19 VITALS — BP 138/88 | HR 91 | Ht 70.0 in | Wt 170.8 lb

## 2016-01-19 DIAGNOSIS — E785 Hyperlipidemia, unspecified: Secondary | ICD-10-CM | POA: Diagnosis not present

## 2016-01-19 DIAGNOSIS — I2510 Atherosclerotic heart disease of native coronary artery without angina pectoris: Secondary | ICD-10-CM | POA: Diagnosis not present

## 2016-01-19 DIAGNOSIS — I10 Essential (primary) hypertension: Secondary | ICD-10-CM

## 2016-01-19 MED ORDER — METOPROLOL TARTRATE 50 MG PO TABS: 50.0000 mg | ORAL_TABLET | Freq: Two times a day (BID) | ORAL | Status: DC

## 2016-01-19 MED ORDER — PANTOPRAZOLE SODIUM 20 MG PO TBEC
20.0000 mg | DELAYED_RELEASE_TABLET | Freq: Every morning | ORAL | Status: DC
Start: 2016-01-19 — End: 2016-12-23

## 2016-01-19 MED ORDER — LOSARTAN POTASSIUM 100 MG PO TABS: ORAL_TABLET | ORAL | Status: DC

## 2016-01-19 MED ORDER — NITROGLYCERIN 0.4 MG SL SUBL: 0.4000 mg | SUBLINGUAL_TABLET | SUBLINGUAL | Status: DC | PRN

## 2016-01-19 MED ORDER — SIMVASTATIN 40 MG PO TABS: 40.0000 mg | ORAL_TABLET | Freq: Every day | ORAL | Status: DC

## 2016-01-19 MED ORDER — CLOPIDOGREL BISULFATE 75 MG PO TABS: 75.0000 mg | ORAL_TABLET | Freq: Every day | ORAL | Status: DC

## 2016-01-19 NOTE — Progress Notes (Signed)
Chief Complaint  Patient presents with  . Follow-up      History of Present Illness: 50 yo WM with history of tobacco abuse, HTN and CAD who is here today for cardiac follow up. He was admitted to [facility] on May 18, 2010 with a NSTEMI and was found to have severe obstructive disease in the LAD. Two drug eluting stents were placed in the LAD. Mild hypokinesis of the anterolateral wall. No stress testing or f/u echo since then as pt has refused due to finances. Seen in the ED March 2015 with chest pain after drinking a beer. Troponin was negative. Felt to be GI related. Started on PPI.   He is here today for follow up. He is smoking again. He is working at [employer]. No chest pain, SOB, palpitations. Overall feeling well.   Primary Care Physician: London Pepper, MD   Past Medical History  Diagnosis Date  . Hypertension   . History of non-ST elevation myocardial infarction (NSTEMI)     05-18-2010  s/p  PCI and DES x2 to LAD  . S/P drug eluting coronary stent placement     05-18-2010  to mLAD and pLAD  . CAD (coronary artery disease) cardiologist-  dr Angelena Form    NSTEMI--- s/p cath 05/18/2010 DES  x2  to mLAD and pLAD not overlapping  . Bilateral recurrent inguinal hernia   . History of pericarditis     12-23-2010  resolved  . Nonischemic cardiomyopathy ([place])     last ef 40-45% per echo 2011  . Seasonal allergies   . Smokers' cough ([place])   . GERD (gastroesophageal reflux disease)   . Rash     mid chest    Past Surgical History  Procedure Laterality Date  . Mandible fracture surgery Left 1990's  . Transthoracic echocardiogram  05-18-2010    septal, apical, and inferoapical hypokinesis,  ef 40-45%/  trivial MR/  mild TR  . Coronary angioplasty with stent placement  05-18-2010   dr Angelena Form    PCI and DES x2 to mLAD and pLAD (not overlapping)/  hypokinesis of anterolateral wall and apex w/ ef 50%/  mild nonobstuctive cad in RCA and CFX  . Inguinal hernia  repair Bilateral age 1  . Inguinal hernia repair Bilateral 12/21/2015    Procedure: LAPAROSCOPIC BILATERAL INGUINAL HERNIA REPAIR W/MESH ;  Surgeon: Arta Bruce Kinsinger, MD;  Location: [place];  Service: General;  Laterality: Bilateral;    Current Outpatient Prescriptions  Medication Sig Dispense Refill  . aspirin 81 MG tablet Take 1 tablet (81 mg total) by mouth daily. 30 tablet   . clopidogrel (PLAVIX) 75 MG tablet Take 1 tablet (75 mg total) by mouth daily. 90 tablet 3  . ibuprofen (ADVIL,MOTRIN) 800 MG tablet Take 800 mg by mouth every 8 (eight) hours as needed.    Marland Kitchen losartan (COZAAR) 100 MG tablet TAKE 1 TABLET BY MOUTH ('100MG'$  TOTAL) DAILY 90 tablet 3  . metoprolol (LOPRESSOR) 50 MG tablet Take 1 tablet (50 mg total) by mouth 2 (two) times daily. 180 tablet 3  . nitroGLYCERIN (NITROSTAT) 0.4 MG SL tablet Place 1 tablet (0.4 mg total) under the tongue every 5 (five) minutes as needed. 75 tablet 3  . pantoprazole (PROTONIX) 20 MG tablet Take 1 tablet (20 mg total) by mouth every morning. 90 tablet 3  . simvastatin (ZOCOR) 40 MG tablet Take 1 tablet (40 mg total) by mouth at bedtime. 90 tablet 3   No current  facility-administered medications for this visit.    No Known Allergies  Social History   Social History  . Marital Status: Married    Spouse Name: N/A  . Number of Children: 3  . Years of Education: N/A   Occupational History  . student     [employer]  . gas station attendant    Social History Main Topics  . Smoking status: Current Every Day Smoker -- 0.50 packs/day for 40 years    Types: Cigarettes  . Smokeless tobacco: Never Used  . Alcohol Use: 8.4 oz/week    14 Cans of beer per week     Comment: 6 beers  . Drug Use: No     Comment: per pt quit long time ago  . Sexual Activity: Not on file   Other Topics Concern  . Not on file   Social History Narrative    Family History  Problem Relation Age of Onset  . Coronary artery disease Father      Review of Systems:  As stated in the HPI and otherwise negative.   BP 138/88 mmHg  Pulse 91  Ht '5\' 10"'$  (1.778 m)  Wt 170 lb 12.8 oz (77.474 kg)  BMI 24.51 kg/m2  SpO2 98%  Physical Examination: General: Well developed, well nourished, NAD HEENT: OP clear, mucus membranes moist SKIN: warm, dry. No rashes. Neuro: No focal deficits Musculoskeletal: Muscle strength 5/5 all ext Psychiatric: Mood and affect normal Neck: No JVD, no carotid bruits, no thyromegaly, no lymphadenopathy. Lungs:Clear bilaterally, no wheezes, rhonci, crackles Cardiovascular: Regular rate and rhythm. No murmurs, gallops or rubs. Abdomen:Soft. Bowel sounds present. Non-tender.  Extremities: No lower extremity edema. Pulses are 2 + in the bilateral DP/PT.  EKG:  EKG is not ordered today. The ekg ordered today demonstrates   Recent Labs: 12/21/2015: Hemoglobin 16.3; Potassium 3.8; Sodium 138   Lipid Panel    Component Value Date/Time   CHOL 174 09/25/2013 1147   TRIG 87.0 09/25/2013 1147   HDL 66.90 09/25/2013 1147   CHOLHDL 3 09/25/2013 1147   VLDL 17.4 09/25/2013 1147   LDLCALC 90 09/25/2013 1147     Wt Readings from Last 3 Encounters:  01/19/16 170 lb 12.8 oz (77.474 kg)  12/21/15 166 lb 8 oz (75.524 kg)  05/27/14 174 lb (78.926 kg)     Other studies Reviewed: Additional studies/ records that were reviewed today include: . Review of the above records demonstrates:    Assessment and Plan:   1. CAD: Stable. Continue current therapy including beta blocker, ARB, statin, ASA and Plavix. Will continue ASA and Plavix since he has overlapping drug eluting stents in the LAD.   2. HTN: BP controlled. No changes.    3. Hyperlipidemia: Continue statin. Followed in primary care.   Current medicines are reviewed at length with the patient today.  The patient does not have concerns regarding medicines.  The following changes have been made:  no change  Labs/ tests ordered today include:  No  orders of the defined types were placed in this encounter.    Disposition:   FU with me in 12  months  Signed, Lauree Chandler, MD 01/19/2016 10:41 AM    [facility] [place], [place], [place]  [address] Phone: [phone]; Fax: [phone]

## 2016-01-19 NOTE — Patient Instructions (Signed)

## 2016-06-26 ENCOUNTER — Other Ambulatory Visit: Payer: Self-pay | Admitting: Cardiovascular Disease

## 2016-06-26 MED ORDER — CLOPIDOGREL BISULFATE 75 MG PO TABS: 75.0000 mg | ORAL_TABLET | Freq: Every day | ORAL | 2 refills | Status: DC

## 2016-12-22 ENCOUNTER — Encounter (HOSPITAL_COMMUNITY): Payer: Self-pay | Admitting: Emergency Medicine

## 2016-12-22 ENCOUNTER — Observation Stay (HOSPITAL_BASED_OUTPATIENT_CLINIC_OR_DEPARTMENT_OTHER): Payer: Managed Care, Other (non HMO)

## 2016-12-22 ENCOUNTER — Observation Stay (HOSPITAL_COMMUNITY)
Admission: EM | Admit: 2016-12-22 | Discharge: 2016-12-23 | Disposition: A | Discharge status: Home / Self Care | Payer: Managed Care, Other (non HMO) | Attending: Internal Medicine | Admitting: Internal Medicine

## 2016-12-22 ENCOUNTER — Emergency Department (HOSPITAL_COMMUNITY): Payer: Managed Care, Other (non HMO)

## 2016-12-22 DIAGNOSIS — I10 Essential (primary) hypertension: Secondary | ICD-10-CM | POA: Diagnosis not present

## 2016-12-22 DIAGNOSIS — Z955 Presence of coronary angioplasty implant and graft: Secondary | ICD-10-CM | POA: Diagnosis not present

## 2016-12-22 DIAGNOSIS — Z7902 Long term (current) use of antithrombotics/antiplatelets: Secondary | ICD-10-CM | POA: Diagnosis not present

## 2016-12-22 DIAGNOSIS — F17200 Nicotine dependence, unspecified, uncomplicated: Secondary | ICD-10-CM

## 2016-12-22 DIAGNOSIS — I200 Unstable angina: Secondary | ICD-10-CM | POA: Diagnosis not present

## 2016-12-22 DIAGNOSIS — Z79899 Other long term (current) drug therapy: Secondary | ICD-10-CM | POA: Diagnosis not present

## 2016-12-22 DIAGNOSIS — R072 Precordial pain: Principal | ICD-10-CM | POA: Insufficient documentation

## 2016-12-22 DIAGNOSIS — R079 Chest pain, unspecified: Secondary | ICD-10-CM | POA: Diagnosis not present

## 2016-12-22 DIAGNOSIS — Z7982 Long term (current) use of aspirin: Secondary | ICD-10-CM | POA: Diagnosis not present

## 2016-12-22 DIAGNOSIS — R0789 Other chest pain: Secondary | ICD-10-CM | POA: Diagnosis present

## 2016-12-22 DIAGNOSIS — E785 Hyperlipidemia, unspecified: Secondary | ICD-10-CM

## 2016-12-22 DIAGNOSIS — F17210 Nicotine dependence, cigarettes, uncomplicated: Secondary | ICD-10-CM | POA: Diagnosis not present

## 2016-12-22 DIAGNOSIS — I2510 Atherosclerotic heart disease of native coronary artery without angina pectoris: Secondary | ICD-10-CM | POA: Diagnosis not present

## 2016-12-22 DIAGNOSIS — F4322 Adjustment disorder with anxiety: Secondary | ICD-10-CM | POA: Diagnosis present

## 2016-12-22 DIAGNOSIS — I252 Old myocardial infarction: Secondary | ICD-10-CM | POA: Insufficient documentation

## 2016-12-22 LAB — CBC WITH DIFFERENTIAL/PLATELET
BASOS ABS: 0 10*3/uL (ref 0.0–0.1)
Basophils Relative: 0 %
EOS ABS: 0.3 10*3/uL (ref 0.0–0.7)
EOS PCT: 3 %
HCT: 44.1 % (ref 39.0–52.0)
Hemoglobin: 15.3 g/dL (ref 13.0–17.0)
LYMPHS PCT: 29 %
Lymphs Abs: 2.4 10*3/uL (ref 0.7–4.0)
MCH: 32.2 pg (ref 26.0–34.0)
MCHC: 34.7 g/dL (ref 30.0–36.0)
MCV: 92.8 fL (ref 78.0–100.0)
Monocytes Absolute: 0.9 10*3/uL (ref 0.1–1.0)
Monocytes Relative: 11 %
Neutro Abs: 4.7 10*3/uL (ref 1.7–7.7)
Neutrophils Relative %: 57 %
PLATELETS: 211 10*3/uL (ref 150–400)
RBC: 4.75 MIL/uL (ref 4.22–5.81)
RDW: 13.3 % (ref 11.5–15.5)
WBC: 8.4 10*3/uL (ref 4.0–10.5)

## 2016-12-22 LAB — COMPREHENSIVE METABOLIC PANEL
ALT: 29 U/L (ref 17–63)
AST: 27 U/L (ref 15–41)
Albumin: 3.9 g/dL (ref 3.5–5.0)
Alkaline Phosphatase: 56 U/L (ref 38–126)
Anion gap: 11 (ref 5–15)
BUN: 11 mg/dL (ref 6–20)
CHLORIDE: 103 mmol/L (ref 101–111)
CO2: 23 mmol/L (ref 22–32)
CREATININE: 0.82 mg/dL (ref 0.61–1.24)
Calcium: 9 mg/dL (ref 8.9–10.3)
GFR calc Af Amer: >60 mL/min (ref >60)
GFR calc non Af Amer: >60 mL/min (ref >60)
Glucose, Bld: 96 mg/dL (ref 65–99)
Potassium: 3.8 mmol/L (ref 3.5–5.1)
SODIUM: 137 mmol/L (ref 135–145)
Total Bilirubin: 0.6 mg/dL (ref 0.3–1.2)
Total Protein: 7.2 g/dL (ref 6.5–8.1)

## 2016-12-22 LAB — RAPID URINE DRUG SCREEN, HOSP PERFORMED
AMPHETAMINES: NOT DETECTED
Barbiturates: NOT DETECTED
Benzodiazepines: POSITIVE — AB
COCAINE: NOT DETECTED
OPIATES: NOT DETECTED
TETRAHYDROCANNABINOL: POSITIVE — AB

## 2016-12-22 LAB — LIPASE, BLOOD
LIPASE: 25 U/L (ref 11–51)
Lipase: 31 U/L (ref 11–51)

## 2016-12-22 LAB — CBC
HEMATOCRIT: 44.4 % (ref 39.0–52.0)
HEMOGLOBIN: 15 g/dL (ref 13.0–17.0)
MCH: 31.4 pg (ref 26.0–34.0)
MCHC: 33.8 g/dL (ref 30.0–36.0)
MCV: 92.9 fL (ref 78.0–100.0)
Platelets: 211 10*3/uL (ref 150–400)
RBC: 4.78 MIL/uL (ref 4.22–5.81)
RDW: 13.4 % (ref 11.5–15.5)
WBC: 7.3 10*3/uL (ref 4.0–10.5)

## 2016-12-22 LAB — ECHOCARDIOGRAM COMPLETE
Height: 71 in
Weight: 2880 oz

## 2016-12-22 LAB — MAGNESIUM: MAGNESIUM: 2.4 mg/dL (ref 1.7–2.4)

## 2016-12-22 LAB — I-STAT TROPONIN, ED: Troponin i, poc: 0 ng/mL (ref 0.00–0.08)

## 2016-12-22 LAB — CREATININE, SERUM
Creatinine, Ser: 0.86 mg/dL (ref 0.61–1.24)
GFR calc non Af Amer: >60 mL/min (ref >60)

## 2016-12-22 LAB — TROPONIN I: Troponin I: <0.03 ng/mL (ref <0.03)

## 2016-12-22 LAB — HIV ANTIBODY (ROUTINE TESTING W REFLEX): HIV Screen 4th Generation wRfx: NONREACTIVE

## 2016-12-22 LAB — D-DIMER, QUANTITATIVE: D-Dimer, Quant: 0.27 ug/mL-FEU (ref 0.00–0.50)

## 2016-12-22 LAB — ETHANOL: Alcohol, Ethyl (B): 261 mg/dL — ABNORMAL HIGH (ref <5)

## 2016-12-22 MED ORDER — LORAZEPAM 1 MG PO TABS: 1.0000 mg | ORAL_TABLET | Freq: Four times a day (QID) | ORAL | Status: DC | PRN

## 2016-12-22 MED ORDER — CLOPIDOGREL BISULFATE 75 MG PO TABS
75.0000 mg | ORAL_TABLET | Freq: Every day | ORAL | Status: DC
Start: 2016-12-22 — End: 2016-12-23
  Administered 2016-12-22 – 2016-12-23 (×2): 75 mg via ORAL
  Filled 2016-12-22 (×2): qty 1

## 2016-12-22 MED ORDER — ALPRAZOLAM 0.25 MG PO TABS: 0.2500 mg | ORAL_TABLET | Freq: Two times a day (BID) | ORAL | Status: DC | PRN

## 2016-12-22 MED ORDER — ASPIRIN 81 MG PO TABS: 81.0000 mg | ORAL_TABLET | Freq: Every day | ORAL | Status: DC

## 2016-12-22 MED ORDER — THIAMINE HCL 100 MG/ML IJ SOLN: 100.0000 mg | Freq: Every day | INTRAMUSCULAR | Status: DC

## 2016-12-22 MED ORDER — ASPIRIN EC 81 MG PO TBEC
81.0000 mg | DELAYED_RELEASE_TABLET | Freq: Every day | ORAL | Status: DC
  Administered 2016-12-22 – 2016-12-23 (×2): 81 mg via ORAL
  Filled 2016-12-22 (×2): qty 1

## 2016-12-22 MED ORDER — ONDANSETRON HCL 4 MG/2ML IJ SOLN: 4.0000 mg | Freq: Four times a day (QID) | INTRAMUSCULAR | Status: DC | PRN

## 2016-12-22 MED ORDER — PANTOPRAZOLE SODIUM 40 MG PO TBEC
40.0000 mg | DELAYED_RELEASE_TABLET | Freq: Two times a day (BID) | ORAL | Status: DC
  Administered 2016-12-22 – 2016-12-23 (×3): 40 mg via ORAL
  Filled 2016-12-22 (×3): qty 1

## 2016-12-22 MED ORDER — VITAMIN B-1 100 MG PO TABS
100.0000 mg | ORAL_TABLET | Freq: Every day | ORAL | Status: DC
  Administered 2016-12-22 – 2016-12-23 (×2): 100 mg via ORAL
  Filled 2016-12-22 (×2): qty 1

## 2016-12-22 MED ORDER — FOLIC ACID 1 MG PO TABS
1.0000 mg | ORAL_TABLET | Freq: Every day | ORAL | Status: DC
  Administered 2016-12-22 – 2016-12-23 (×2): 1 mg via ORAL
  Filled 2016-12-22 (×2): qty 1

## 2016-12-22 MED ORDER — LORAZEPAM 2 MG/ML IJ SOLN: 1.0000 mg | Freq: Four times a day (QID) | INTRAMUSCULAR | Status: DC | PRN

## 2016-12-22 MED ORDER — NICOTINE 21 MG/24HR TD PT24
21.0000 mg | MEDICATED_PATCH | Freq: Once | TRANSDERMAL | Status: AC
  Administered 2016-12-22: 21 mg via TRANSDERMAL
  Filled 2016-12-22: qty 1

## 2016-12-22 MED ORDER — METOPROLOL TARTRATE 50 MG PO TABS
50.0000 mg | ORAL_TABLET | Freq: Two times a day (BID) | ORAL | Status: DC
  Administered 2016-12-22 – 2016-12-23 (×3): 50 mg via ORAL
  Filled 2016-12-22 (×2): qty 1
  Filled 2016-12-22: qty 2

## 2016-12-22 MED ORDER — SIMVASTATIN 40 MG PO TABS
40.0000 mg | ORAL_TABLET | Freq: Every day | ORAL | Status: DC
  Administered 2016-12-22: 40 mg via ORAL
  Filled 2016-12-22: qty 1

## 2016-12-22 MED ORDER — ASPIRIN 81 MG PO CHEW
324.0000 mg | CHEWABLE_TABLET | Freq: Once | ORAL | Status: AC
  Administered 2016-12-22: 324 mg via ORAL
  Filled 2016-12-22: qty 4

## 2016-12-22 MED ORDER — NITROGLYCERIN 0.4 MG SL SUBL
0.4000 mg | SUBLINGUAL_TABLET | SUBLINGUAL | Status: DC | PRN
  Filled 2016-12-22: qty 1

## 2016-12-22 MED ORDER — LORAZEPAM 2 MG/ML IJ SOLN
1.0000 mg | Freq: Once | INTRAMUSCULAR | Status: AC
  Administered 2016-12-22: 1 mg via INTRAVENOUS
  Filled 2016-12-22: qty 1

## 2016-12-22 MED ORDER — ADULT MULTIVITAMIN W/MINERALS CH
1.0000 | ORAL_TABLET | Freq: Every day | ORAL | Status: DC
  Administered 2016-12-22 – 2016-12-23 (×2): 1 via ORAL
  Filled 2016-12-22 (×2): qty 1

## 2016-12-22 MED ORDER — ACETAMINOPHEN 325 MG PO TABS: 650.0000 mg | ORAL_TABLET | ORAL | Status: DC | PRN

## 2016-12-22 MED ORDER — MORPHINE SULFATE (PF) 4 MG/ML IV SOLN: 2.0000 mg | INTRAVENOUS | Status: DC | PRN

## 2016-12-22 MED ORDER — SUCRALFATE 1 GM/10ML PO SUSP
1.0000 g | Freq: Three times a day (TID) | ORAL | Status: DC
  Administered 2016-12-22 – 2016-12-23 (×5): 1 g via ORAL
  Filled 2016-12-22 (×5): qty 10

## 2016-12-22 MED ORDER — ENOXAPARIN SODIUM 40 MG/0.4ML ~~LOC~~ SOLN
40.0000 mg | Freq: Every day | SUBCUTANEOUS | Status: DC
  Administered 2016-12-22 – 2016-12-23 (×2): 40 mg via SUBCUTANEOUS
  Filled 2016-12-22 (×2): qty 0.4

## 2016-12-22 NOTE — H&P (Signed)
[facility] History and Physical  Anthony Moon NLZ:[id number] DOB: July 26, 1966 DOA: 12/22/2016  Referring physician:   PCP: London Pepper, MD   Chief Complaint:    HPI:  51 year old , with a history of hypertension, coronary artery disease, non-ST elevation MI, severe obstructive disease  , status post drug-eluting stent, alcohol dependence who presents to the ER today with chest tightness and heartburn for the last 3 weeks. Patient has been experiencing a lot of stress at work and has been drinking heavily. He also complains of increased anxiety, and drinks 4 beers a day on a regular basis to cope with his anxiety. He denies any fever, cough, chills. He has slight wheezing but no significant dyspnea on exertion. Patient states that he has been compliant with his aspirin and Plavix. ER course BP (!) 134/116   Pulse 86   Resp 18   SpO2 95%  EtOH level 261, EKG shows normal sinus rhythm. Lipase and LFTs are within normal limits. Patient is being admitted for alcohol intoxication, cardiology evaluation for chest pain     Review of Systems: negative for the following  Constitutional: Denies fever, chills, diaphoresis, appetite change and fatigue.  HEENT: Denies photophobia, eye pain, redness, hearing loss, ear pain, congestion, sore throat, rhinorrhea, sneezing, mouth sores, trouble swallowing, neck pain, neck stiffness and tinnitus.  Respiratory: Denies SOB, DOE, cough, chest tightness, and wheezing.  Cardiovascular: Positive for chest pain. , palpitations and leg swelling.  Gastrointestinal: Denies nausea, vomiting, abdominal pain, diarrhea, constipation, blood in stool and abdominal distention.  Genitourinary: Denies dysuria, urgency, frequency, hematuria, flank pain and difficulty urinating.  Musculoskeletal: Denies myalgias, back pain, joint swelling, arthralgias and gait problem.  Skin: Denies pallor, rash and wound.  Neurological: Denies dizziness, seizures, syncope, weakness,  light-headedness, numbness and headaches.  Hematological: Denies adenopathy. Easy bruising, personal or family bleeding history  Psychiatric/Behavioral: Denies suicidal ideation, mood changes, confusion, nervousness, sleep disturbance and agitation       Past Medical History:  Diagnosis Date  . Bilateral recurrent inguinal hernia   . CAD (coronary artery disease) cardiologist-  dr Angelena Form   NSTEMI--- s/p cath 05/18/2010 DES  x2  to mLAD and pLAD not overlapping  . GERD (gastroesophageal reflux disease)   . History of non-ST elevation myocardial infarction (NSTEMI)    05-18-2010  s/p  PCI and DES x2 to LAD  . History of pericarditis    12-23-2010  resolved  . Hypertension   . Nonischemic cardiomyopathy ([place])    last ef 40-45% per echo 2011  . Rash    mid chest  . S/P drug eluting coronary stent placement    05-18-2010  to mLAD and pLAD  . Seasonal allergies   . Smokers' cough [facility])      Past Surgical History:  Procedure Laterality Date  . CORONARY ANGIOPLASTY WITH STENT PLACEMENT  05-18-2010   dr Angelena Form   PCI and DES x2 to mLAD and pLAD (not overlapping)/  hypokinesis of anterolateral wall and apex w/ ef 50%/  mild nonobstuctive cad in RCA and CFX  . INGUINAL HERNIA REPAIR Bilateral age 74  . INGUINAL HERNIA REPAIR Bilateral 12/21/2015   Procedure: LAPAROSCOPIC BILATERAL INGUINAL HERNIA REPAIR W/MESH ;  Surgeon: Arta Bruce Kinsinger, MD;  Location: [place];  Service: General;  Laterality: Bilateral;  . MANDIBLE FRACTURE SURGERY Left 1990's  . TRANSTHORACIC ECHOCARDIOGRAM  05-18-2010   septal, apical, and inferoapical hypokinesis,  ef 40-45%/  trivial MR/  mild TR  Social History:  reports that he has been smoking Cigarettes.  He has a 20.00 pack-year smoking history. He has never used smokeless tobacco. He reports that he drinks about 8.4 oz of alcohol per week . He reports that he does not use drugs.    No Known Allergies  Family History  Problem  Relation Age of Onset  . Coronary artery disease Father         Prior to Admission medications   Medication Sig Start Date End Date Taking? Authorizing Provider  aspirin 81 MG tablet Take 1 tablet (81 mg total) by mouth daily. 01/16/11  Yes Burnell Blanks, MD  clopidogrel (PLAVIX) 75 MG tablet Take 1 tablet (75 mg total) by mouth daily. 06/26/16  Yes Burnell Blanks, MD  losartan (COZAAR) 100 MG tablet TAKE 1 TABLET BY MOUTH ('100MG'$  TOTAL) DAILY 01/19/16  Yes Burnell Blanks, MD  metoprolol (LOPRESSOR) 50 MG tablet Take 1 tablet (50 mg total) by mouth 2 (two) times daily. 01/19/16  Yes Burnell Blanks, MD  nitroGLYCERIN (NITROSTAT) 0.4 MG SL tablet Place 1 tablet (0.4 mg total) under the tongue every 5 (five) minutes as needed. 01/19/16  Yes Burnell Blanks, MD  pantoprazole (PROTONIX) 20 MG tablet Take 1 tablet (20 mg total) by mouth every morning. 01/19/16  Yes Burnell Blanks, MD  simvastatin (ZOCOR) 40 MG tablet Take 1 tablet (40 mg total) by mouth at bedtime. 01/19/16  Yes Burnell Blanks, MD     Physical Exam: Vitals:   12/22/16 0530 12/22/16 0713 12/22/16 0715 12/22/16 0801  BP: (!) 134/116 109/78 114/76 127/82  Pulse: 86 85 81 78  Resp: '18 17 19 18  '$ Temp:    97.6 F (36.4 C)  TempSrc:    Oral  SpO2: 95% 95% 95% 97%      Constitutional: NAD, calm, comfortable Vitals:   12/22/16 0530 12/22/16 0713 12/22/16 0715 12/22/16 0801  BP: (!) 134/116 109/78 114/76 127/82  Pulse: 86 85 81 78  Resp: '18 17 19 18  '$ Temp:    97.6 F (36.4 C)  TempSrc:    Oral  SpO2: 95% 95% 95% 97%   Eyes: PERRL, lids and conjunctivae normal ENMT: Mucous membranes are moist. Posterior pharynx clear of any exudate or lesions.Normal dentition.  Neck: normal, supple, no masses, no thyromegaly Respiratory: clear to auscultation bilaterally, no wheezing, no crackles. Normal respiratory effort. No accessory muscle use.  Cardiovascular: Regular rate and rhythm, no  murmurs / rubs / gallops. No extremity edema. 2+ pedal pulses. No carotid bruits.  Abdomen: no tenderness, no masses palpated. No hepatosplenomegaly. Bowel sounds positive.  Musculoskeletal: no clubbing / cyanosis. No joint deformity upper and lower extremities. Good ROM, no contractures. Normal muscle tone.  Skin: no rashes, lesions, ulcers. No induration Neurologic: CN 2-12 grossly intact. Sensation intact, DTR normal. Strength 5/5 in all 4.  Psychiatric: Normal judgment and insight. Alert and oriented x 3. Normal mood.     Labs on Admission: I have personally reviewed following labs and imaging studies  CBC:  Recent Labs Lab 12/22/16 0441 12/22/16 0756  WBC 8.4 7.3  NEUTROABS 4.7  --   HGB 15.3 15.0  HCT 44.1 44.4  MCV 92.8 92.9  PLT 211 287    Basic Metabolic Panel:  Recent Labs Lab 12/22/16 0441  NA 137  K 3.8  CL 103  CO2 23  GLUCOSE 96  BUN 11  CREATININE 0.82  CALCIUM 9.0    GFR: CrCl cannot  be calculated (Unknown ideal weight.).  Liver Function Tests:  Recent Labs Lab 12/22/16 0441  AST 27  ALT 29  ALKPHOS 56  BILITOT 0.6  PROT 7.2  ALBUMIN 3.9    Recent Labs Lab 12/22/16 0441  LIPASE 31   No results for input(s): AMMONIA in the last 168 hours.  Coagulation Profile: No results for input(s): INR, PROTIME in the last 168 hours. No results for input(s): DDIMER in the last 72 hours.  Cardiac Enzymes: No results for input(s): CKTOTAL, CKMB, CKMBINDEX, TROPONINI in the last 168 hours.  BNP (last 3 results) No results for input(s): PROBNP in the last 8760 hours.  HbA1C: No results for input(s): HGBA1C in the last 72 hours. Lab Results  Component Value Date   HGBA1C  05/18/2010    5.5 (NOTE)                                                                       According to the ADA Clinical Practice Recommendations for 2011, when HbA1c is used as a screening test:   >=6.5%   Diagnostic of Diabetes Mellitus           (if abnormal result   is confirmed)  5.7-6.4%   Increased risk of developing Diabetes Mellitus  References:Diagnosis and Classification of Diabetes Mellitus,Diabetes PPIR,5188,41(YSAYT 1):S62-S69 and Standards of Medical Care in         Diabetes - 2011,Diabetes Care,2011,34  (Suppl 1):S11-S61.     CBG: No results for input(s): GLUCAP in the last 168 hours.  Lipid Profile: No results for input(s): CHOL, HDL, LDLCALC, TRIG, CHOLHDL, LDLDIRECT in the last 72 hours.  Thyroid Function Tests: No results for input(s): TSH, T4TOTAL, FREET4, T3FREE, THYROIDAB in the last 72 hours.  Anemia Panel: No results for input(s): VITAMINB12, FOLATE, FERRITIN, TIBC, IRON, RETICCTPCT in the last 72 hours.  Urine analysis:    Component Value Date/Time   COLORURINE YELLOW 05/18/2010 1139   APPEARANCEUR CLEAR 05/18/2010 1139   LABSPEC 1.043 (H) 05/18/2010 1139   PHURINE 8.5 (H) 05/18/2010 1139   GLUCOSEU NEGATIVE 05/18/2010 1139   HGBUR TRACE (A) 05/18/2010 1139   BILIRUBINUR NEGATIVE 05/18/2010 1139   KETONESUR NEGATIVE 05/18/2010 1139   PROTEINUR NEGATIVE 05/18/2010 1139   UROBILINOGEN 0.2 05/18/2010 1139   NITRITE NEGATIVE 05/18/2010 1139   LEUKOCYTESUR NEGATIVE 05/18/2010 1139    Sepsis Labs: '@LABRCNTIP'$ (procalcitonin:4,lacticidven:4) )No results found for this or any previous visit (from the past 240 hour(s)).       Radiological Exams on Admission: Dg Chest Port 1 View  Result Date: 12/22/2016 CLINICAL DATA:  Chest pain and right upper extremity pain, intermittent for several weeks. EXAM: PORTABLE CHEST 1 VIEW COMPARISON:  12/21/2013 FINDINGS: Shallow inspiration. The lungs are clear. The pulmonary vasculature is normal. Hilar and mediastinal contours are unremarkable unchanged. IMPRESSION: No acute cardiopulmonary findings. Electronically Signed   By: Andreas Newport M.D.   On: 12/22/2016 05:33   Dg Chest Port 1 View  Result Date: 12/22/2016 CLINICAL DATA:  Chest pain and right upper extremity pain,  intermittent for several weeks. EXAM: PORTABLE CHEST 1 VIEW COMPARISON:  12/21/2013 FINDINGS: Shallow inspiration. The lungs are clear. The pulmonary vasculature is normal. Hilar and mediastinal contours are unremarkable unchanged. IMPRESSION: No acute  cardiopulmonary findings. Electronically Signed   By: Andreas Newport M.D.   On: 12/22/2016 05:33      EKG: Independently reviewed.    Assessment/Plan     Chest pain, rule out acute myocardial infarction, No history of coronary artery disease, status post drug-eluting stent in 2011, to LAD Chest pain atypical likely related to gastroesophageal reflux possible gastritis Admit to telemetry Cycle cardiac enzymes 2-D echo to rule out wall motion abnormalities Cardiology has been consulted given history of coronary artery disease, also need to evaluate the need for dual antiplatelet therapy long-term D-dimer 0.27  Mild nonischemic cardiomyopathy, ejection fraction 45-50% at the     time of myocardial infarction. Does not appear to be in CHF exacerbation, last known 2-D echo in 2011  Tobacco abuse-nicotine patch will be provided  Gastroesophageal reflux disease Patient is on dual antiplatelet therapy and low-dose Protonix Will increase Protonix to 40 mg twice a day, also start the patient on Carafate Patient may benefit from outpatient endoscopic evaluation    Hypertension. Hold Cozaar for now, blood pressure okay, resume her blood pressure increases   Hyperlipidemia.-Continue statin     Adjustment disorder with anxiety, alcohol dependence Patient will be started on CIWA protocol and monitored for withdrawal     DVT prophylaxis: Lovenox     Code Status Orders Full code         consults called:Cardiology  Family Communication: Admission, patients condition and plan of care including tests being ordered have been discussed with the patient  who indicates understanding and agree with the plan and Code Status  Admission  status: Observation  Disposition plan: Admitted for observation   [facility] MD [facility] Pager [phone]  If 7PM-7AM, please contact night-coverage [email or web address] Password [facility]  12/22/2016, 8:32 AM

## 2016-12-22 NOTE — ED Notes (Signed)
Nurse starting IV and drawing labs. 

## 2016-12-22 NOTE — ED Notes (Signed)
PT wife is at bedside

## 2016-12-22 NOTE — ED Notes (Signed)
Pt refused sublingual nitro at this time.

## 2016-12-22 NOTE — Consult Note (Signed)
Patient ID: Anthony Moon MRN: [id number], DOB/AGE: [birth year]   Admit date: 12/22/2016   Reason for Consult: Chest Pain Requesting MD: Dr. Veatrice Kells, Internal Medicine    Primary Physician: London Pepper, MD Primary Cardiologist: Dr. Angelena Form  Pt. Profile:  51 y/o male, followed by Dr. Angelena Form, with known CAD s/p NSTEMI in 2011 with PCI + DES x 2 to prox-mid LAD, mild LV dysfunction with EF of 45-50% in 2011, HTN and ETOH abuse, admitted by IM for chest pain evaluation.   Problem List  Past Medical History:  Diagnosis Date  . Bilateral recurrent inguinal hernia   . CAD (coronary artery disease) cardiologist-  dr Angelena Form   NSTEMI--- s/p cath 05/18/2010 DES  x2  to mLAD and pLAD not overlapping  . GERD (gastroesophageal reflux disease)   . History of non-ST elevation myocardial infarction (NSTEMI)    05-18-2010  s/p  PCI and DES x2 to LAD  . History of pericarditis    12-23-2010  resolved  . Hypertension   . Nonischemic cardiomyopathy ([place])    last ef 40-45% per echo 2011  . Rash    mid chest  . S/P drug eluting coronary stent placement    05-18-2010  to mLAD and pLAD  . Seasonal allergies   . Smokers' cough [facility])     Past Surgical History:  Procedure Laterality Date  . CORONARY ANGIOPLASTY WITH STENT PLACEMENT  05-18-2010   dr Angelena Form   PCI and DES x2 to mLAD and pLAD (not overlapping)/  hypokinesis of anterolateral wall and apex w/ ef 50%/  mild nonobstuctive cad in RCA and CFX  . INGUINAL HERNIA REPAIR Bilateral age 64  . INGUINAL HERNIA REPAIR Bilateral 12/21/2015   Procedure: LAPAROSCOPIC BILATERAL INGUINAL HERNIA REPAIR W/MESH ;  Surgeon: Arta Bruce Kinsinger, MD;  Location: [place];  Service: General;  Laterality: Bilateral;  . MANDIBLE FRACTURE SURGERY Left 1990's  . TRANSTHORACIC ECHOCARDIOGRAM  05-18-2010   septal, apical, and inferoapical hypokinesis,  ef 40-45%/  trivial MR/  mild TR     Allergies  No Known Allergies  HPI  51 y/o male,  followed by Dr. Angelena Form, with known CAD s/p NSTEMI in 2011 with PCI + DES x 2 to prox-mid LAD , mild LV dysfunction with EF of 45-50% in 2011, HTN, GERD and ETOH abuse, admitted by IM for chest pain evaluation.   POC troponin in the ED was negative. Actual lab troponin is negative x 1. Cyclic troponin's pending. CBC and BMP unremarkable. D-dimer negative. EKG shows NSR w/o ischemia. CXR is also negative for acute process. Ethanol level elevated at 261. UDS pending. Pt has been started on CIWA protocol.   Pt is not contributing much to history at this time. He is sleeping but responds to some questions. Notes he is CP free now. States his symptoms have been going on for months. Does not feel like his angina he had in 2011. No exertional features. He did state that his symptoms are worse after meals. He reports full medication compliance. He has not tried NTG at home. He is afraid of side effects. As outlined above, Ethanol level was 261. Per H&P, he had admitted earlier to heavy drinking prior to coming in to the ED. Also per H&P, patient reported to ED provider that he has had recurrent CP on and off at work, related to increased stress.    Home Medications  Prior to Admission medications   Medication Sig Start Date End Date Taking?  Authorizing Provider  aspirin 81 MG tablet Take 1 tablet (81 mg total) by mouth daily. 01/16/11  Yes Burnell Blanks, MD  clopidogrel (PLAVIX) 75 MG tablet Take 1 tablet (75 mg total) by mouth daily. 06/26/16  Yes Burnell Blanks, MD  losartan (COZAAR) 100 MG tablet TAKE 1 TABLET BY MOUTH ('100MG'$  TOTAL) DAILY 01/19/16  Yes Burnell Blanks, MD  metoprolol (LOPRESSOR) 50 MG tablet Take 1 tablet (50 mg total) by mouth 2 (two) times daily. 01/19/16  Yes Burnell Blanks, MD  nitroGLYCERIN (NITROSTAT) 0.4 MG SL tablet Place 1 tablet (0.4 mg total) under the tongue every 5 (five) minutes as needed. 01/19/16  Yes Burnell Blanks, MD  pantoprazole  (PROTONIX) 20 MG tablet Take 1 tablet (20 mg total) by mouth every morning. 01/19/16  Yes Burnell Blanks, MD  simvastatin (ZOCOR) 40 MG tablet Take 1 tablet (40 mg total) by mouth at bedtime. 01/19/16  Yes Burnell Blanks, [place]  . aspirin EC  81 mg Oral Daily  . clopidogrel  75 mg Oral Daily  . enoxaparin (LOVENOX) injection  40 mg Subcutaneous Daily  . folic acid  1 mg Oral Daily  . metoprolol  50 mg Oral BID  . multivitamin with minerals  1 tablet Oral Daily  . nicotine  21 mg Transdermal Once  . pantoprazole  40 mg Oral BID  . simvastatin  40 mg Oral QHS  . sucralfate  1 g Oral TID WC & HS  . thiamine  100 mg Oral Daily   Or  . thiamine  100 mg Intravenous Daily   Family History  Family History  Problem Relation Age of Onset  . Coronary artery disease Father     Social History  Social History   Social History  . Marital status: Married    Spouse name: N/A  . Number of children: 3  . Years of education: N/A   Occupational History  . student     [employer]  . gas station attendant    Social History Main Topics  . Smoking status: Current Every Day Smoker    Packs/day: 0.50    Years: 40.00    Types: Cigarettes  . Smokeless tobacco: Never Used  . Alcohol use 8.4 oz/week    14 Cans of beer per week     Comment: 6 beers  . Drug use: No     Comment: per pt quit long time ago  . Sexual activity: Not on file   Other Topics Concern  . Not on file   Social History Narrative  . No narrative on file     Review of Systems General:  No chills, fever, night sweats or weight changes.  Cardiovascular:  No chest pain, dyspnea on exertion, edema, orthopnea, palpitations, paroxysmal nocturnal dyspnea. Dermatological: No rash, lesions/masses Respiratory: No cough, dyspnea Urologic: No hematuria, dysuria Abdominal:   No nausea, vomiting, diarrhea, bright red blood per rectum, melena, or hematemesis Neurologic:  No visual changes, wkns, changes in  mental status. All other systems reviewed and are otherwise negative except as noted above.  Physical Exam  Blood pressure 127/82, pulse 78, temperature 97.6 F (36.4 C), temperature source Oral, resp. rate 18, height '5\' 11"'$  (1.803 m), weight 180 lb (81.6 kg), SpO2 97 %.  General: Drowsy but responds to some questions, NAD Psych: Normal affect. Neuro: Alert and oriented X 3. Moves all extremities spontaneously. HEENT: Normal  Neck: Supple without bruits or JVD. Lungs:  Resp regular and unlabored, CTA. Heart: RRR no s3, s4, or murmurs. Abdomen: Soft, non-tender, non-distended, BS + x 4.  Extremities: No clubbing, cyanosis or edema. DP/PT/Radials 2+ and equal bilaterally.  Labs  Troponin [facility] Care Test)  Recent Labs  12/22/16 0445  TROPIPOC 0.00    Recent Labs  12/22/16 0756  TROPONINI <0.03   Lab Results  Component Value Date   WBC 7.3 12/22/2016   HGB 15.0 12/22/2016   HCT 44.4 12/22/2016   MCV 92.9 12/22/2016   PLT 211 12/22/2016     Recent Labs Lab 12/22/16 0441 12/22/16 0756  NA 137  --   K 3.8  --   CL 103  --   CO2 23  --   BUN 11  --   CREATININE 0.82 0.86  CALCIUM 9.0  --   PROT 7.2  --   BILITOT 0.6  --   ALKPHOS 56  --   ALT 29  --   AST 27  --   GLUCOSE 96  --    Lab Results  Component Value Date   CHOL 174 09/25/2013   HDL 66.90 09/25/2013   LDLCALC 90 09/25/2013   TRIG 87.0 09/25/2013   Lab Results  Component Value Date   DDIMER 0.27 12/22/2016     Radiology/Studies  Dg Chest Port 1 View  Result Date: 12/22/2016 CLINICAL DATA:  Chest pain and right upper extremity pain, intermittent for several weeks. EXAM: PORTABLE CHEST 1 VIEW COMPARISON:  12/21/2013 FINDINGS: Shallow inspiration. The lungs are clear. The pulmonary vasculature is normal. Hilar and mediastinal contours are unremarkable unchanged. IMPRESSION: No acute cardiopulmonary findings. Electronically Signed   By: Andreas Newport M.D.   On: 12/22/2016 05:33     ECG  NSR. 90 bpm.   ASSESSMENT AND PLAN  Principal Problem:   Chest pain, rule out acute myocardial infarction Active Problems:   TOBACCO ABUSE   CAD, NATIVE VESSEL   Adjustment disorder with anxiety   Chest pain, atypical   1. Chest Pain/CAD: difficult to obtain detailed history from patient. He is a heavy drinker with elevated Ethanol level detected in ED. He has known CAD with prior MI in 2011 and stenting of his prox-mid LAD at that time. EF 45-50%.  Also a h/o GERD. He is currently CP free with no objective signs of ischemia at this point. EKG shows NSR w/o ischemic abnormalities. POC troponin and actual lab troponin both negative. Cyclic troponins are pending. We will need to rule-out MI. Continue to cycle enzymes x 3. If negative, plan on NST in the AM. Continue current meds, ASA, Plavix, metoprolol and simvastatin. Check lipid panel to ensure LDL is at goal. Continue Protonix for GERD + GI protection given heavy ETOH use. NPO at midnight for likely stress test in the am. Continue CIWA protocol, per IM. MD to follow with further recommendations.    Signed, Lyda Jester, PA-C 12/22/2016, 10:41 AM

## 2016-12-22 NOTE — ED Triage Notes (Addendum)
Pt presents with chest pressure for the past few weeks but states tonight became unbearable.  Pt states it is a burning like "heartburn".  Hx of MI in the past with 2 stents.  Pt tearful.  States has had approximately 4 beers tonight.  Pain does not radiate.  Pain rated at 5/10 at this time. Pt states he took 5 mg of xanax prior to arrival

## 2016-12-22 NOTE — Progress Notes (Signed)
Echocardiogram 2D Echocardiogram has been performed.  Anthony Moon 12/22/2016, 1:54 PM

## 2016-12-22 NOTE — Progress Notes (Signed)
Patient arrives on floor at 0747. Patient immediately placed on monitor and vital signs assessed. Physical assessment completed. Patient with no c/o pain or sob. Skin assessed with second RN and WNL. Oriented x 4. Patient oriented to room. Wife at bedside. Call bell within reach, will continue to monitor patients status.

## 2016-12-22 NOTE — ED Provider Notes (Signed)
[facility] DEPT Provider Note   CSN: [id number] Arrival date & time: 12/22/16  4270     History   Chief Complaint Chief Complaint  Patient presents with  . Chest Pain    HPI Anthony Moon is a 51 y.o. male.  Patient presents to the emergency department for evaluation of chest pain. He describes his chest pain as "heartburn". He does, however, tell me the last time he had pain like this, it was a heart attack. He has a history of multiple stents in 2011.  Ports for the last 3 weeks he has been experiencing chest pain while at work. He reports that he is under a lot of stress at work and also has a strenuous job. He has been noticing the tightness in his chest while at work, but generally when he goes home and relaxes it goes away. Yesterday while at work pain was worse and when he got home it did not improve. Through the night his pain has worsened. He has been experiencing continuous central chest discomfort without associated nausea, diaphoresis, shortness of breath.  He does take Protonix for heartburn. He has been taking his normal doses. He also admits to drinking 4 drinks tonight.      Past Medical History:  Diagnosis Date  . Bilateral recurrent inguinal hernia   . CAD (coronary artery disease) cardiologist-  dr Angelena Form   NSTEMI--- s/p cath 05/18/2010 DES  x2  to mLAD and pLAD not overlapping  . GERD (gastroesophageal reflux disease)   . History of non-ST elevation myocardial infarction (NSTEMI)    05-18-2010  s/p  PCI and DES x2 to LAD  . History of pericarditis    12-23-2010  resolved  . Hypertension   . Nonischemic cardiomyopathy ([place])    last ef 40-45% per echo 2011  . Rash    mid chest  . S/P drug eluting coronary stent placement    05-18-2010  to mLAD and pLAD  . Seasonal allergies   . Smokers' cough [facility])     Patient Active Problem List   Diagnosis Date Noted  . Adjustment disorder with anxiety 12/09/2012  . TOBACCO ABUSE 05/31/2010  . DRUG ABUSE  05/31/2010  . HYPERTENSION, BORDERLINE 05/31/2010  . MYOCARDIAL INFARCTION, ACUTE, SUBENDOCARDIAL 05/31/2010  . CAD, NATIVE VESSEL 05/31/2010  . CARDIOMYOPATHY, ISCHEMIC 05/31/2010    Past Surgical History:  Procedure Laterality Date  . CORONARY ANGIOPLASTY WITH STENT PLACEMENT  05-18-2010   dr Angelena Form   PCI and DES x2 to mLAD and pLAD (not overlapping)/  hypokinesis of anterolateral wall and apex w/ ef 50%/  mild nonobstuctive cad in RCA and CFX  . INGUINAL HERNIA REPAIR Bilateral age 15  . INGUINAL HERNIA REPAIR Bilateral 12/21/2015   Procedure: LAPAROSCOPIC BILATERAL INGUINAL HERNIA REPAIR W/MESH ;  Surgeon: Arta Bruce Kinsinger, MD;  Location: [facility];  Service: General;  Laterality: Bilateral;  . MANDIBLE FRACTURE SURGERY Left 1990's  . TRANSTHORACIC ECHOCARDIOGRAM  05-18-2010   septal, apical, and inferoapical hypokinesis,  ef 40-45%/  trivial MR/  mild TR       Home Medications    Prior to Admission medications   Medication Sig Start Date End Date Taking? Authorizing Provider  aspirin 81 MG tablet Take 1 tablet (81 mg total) by mouth daily. 01/16/11  Yes Burnell Blanks, MD  clopidogrel (PLAVIX) 75 MG tablet Take 1 tablet (75 mg total) by mouth daily. 06/26/16  Yes Burnell Blanks, MD  losartan (COZAAR) 100 MG tablet TAKE  1 TABLET BY MOUTH ('100MG'$  TOTAL) DAILY 01/19/16  Yes Burnell Blanks, MD  metoprolol (LOPRESSOR) 50 MG tablet Take 1 tablet (50 mg total) by mouth 2 (two) times daily. 01/19/16  Yes Burnell Blanks, MD  nitroGLYCERIN (NITROSTAT) 0.4 MG SL tablet Place 1 tablet (0.4 mg total) under the tongue every 5 (five) minutes as needed. 01/19/16  Yes Burnell Blanks, MD  pantoprazole (PROTONIX) 20 MG tablet Take 1 tablet (20 mg total) by mouth every morning. 01/19/16  Yes Burnell Blanks, MD  simvastatin (ZOCOR) 40 MG tablet Take 1 tablet (40 mg total) by mouth at bedtime. 01/19/16  Yes Burnell Blanks, MD    Family  History Family History  Problem Relation Age of Onset  . Coronary artery disease Father     Social History Social History  Substance Use Topics  . Smoking status: Current Every Day Smoker    Packs/day: 0.50    Years: 40.00    Types: Cigarettes  . Smokeless tobacco: Never Used  . Alcohol use 8.4 oz/week    14 Cans of beer per week     Comment: 6 beers     Allergies   Patient has no known allergies.   Review of Systems Review of Systems  Cardiovascular: Positive for chest pain.  All other systems reviewed and are negative.    Physical Exam Updated Vital Signs BP (!) 134/116   Pulse 86   Resp 18   SpO2 95%   Physical Exam  Constitutional: He is oriented to person, place, and time. He appears well-developed and well-nourished. No distress.  HENT:  Head: Normocephalic and atraumatic.  Right Ear: Hearing normal.  Left Ear: Hearing normal.  Nose: Nose normal.  Mouth/Throat: Oropharynx is clear and moist and mucous membranes are normal.  Eyes: Conjunctivae and EOM are normal. Pupils are equal, round, and reactive to light.  Neck: Normal range of motion. Neck supple.  Cardiovascular: Regular rhythm, S1 normal and S2 normal.  Exam reveals no gallop and no friction rub.   No murmur heard. Pulmonary/Chest: Effort normal and breath sounds normal. No respiratory distress. He exhibits no tenderness.  Abdominal: Soft. Normal appearance and bowel sounds are normal. There is no hepatosplenomegaly. There is no tenderness. There is no rebound, no guarding, no tenderness at McBurney's point and negative Murphy's sign. No hernia.  Musculoskeletal: Normal range of motion.  Neurological: He is alert and oriented to person, place, and time. He has normal strength. No cranial nerve deficit or sensory deficit. Coordination normal. GCS eye subscore is 4. GCS verbal subscore is 5. GCS motor subscore is 6.  Skin: Skin is warm, dry and intact. No rash noted. No cyanosis.  Psychiatric: He has  a normal mood and affect. His speech is normal and behavior is normal. Thought content normal.  Nursing note and vitals reviewed.    ED Treatments / Results  Labs (all labs ordered are listed, but only abnormal results are displayed) Labs Reviewed  ETHANOL - Abnormal; Notable for the following:       Result Value   Alcohol, Ethyl (B) 261 (*)    All other components within normal limits  CBC WITH DIFFERENTIAL/PLATELET  COMPREHENSIVE METABOLIC PANEL  LIPASE, BLOOD  I-STAT TROPOININ, ED    EKG  EKG Interpretation  Date/Time:  Friday December 22 2016 04:29:49 EST Ventricular Rate:  90 PR Interval:  170 QRS Duration: 96 QT Interval:  370 QTC Calculation: 452 R Axis:   60 Text Interpretation:  Normal sinus rhythm Septal infarct , age undetermined Abnormal ECG No significant change since last tracing Confirmed by [facility]  MD, [place] [id number]) on 12/22/2016 4:42:14 AM       Radiology Dg Chest Port 1 View  Result Date: 12/22/2016 CLINICAL DATA:  Chest pain and right upper extremity pain, intermittent for several weeks. EXAM: PORTABLE CHEST 1 VIEW COMPARISON:  12/21/2013 FINDINGS: Shallow inspiration. The lungs are clear. The pulmonary vasculature is normal. Hilar and mediastinal contours are unremarkable unchanged. IMPRESSION: No acute cardiopulmonary findings. Electronically Signed   By: Andreas Newport M.D.   On: 12/22/2016 05:33    Procedures Procedures (including critical care time)  Medications Ordered in ED Medications  nitroGLYCERIN (NITROSTAT) SL tablet 0.4 mg (not administered)  aspirin chewable tablet 324 mg (324 mg Oral Given 12/22/16 0453)     Initial Impression / Assessment and Plan / ED Course  I have reviewed the triage vital signs and the nursing notes.  Pertinent labs & imaging results that were available during my care of the patient were reviewed by me and considered in my medical decision making (see chart for details).     Patient presents to the  emergency department for evaluation of chest pain. Patient reports that he has been experiencing daily chest pain while at work for the last 3 weeks. He feels that the pain happens when he gets stressed out at work. He reports that he has a very stressful job environment. Generally when he goes home he feels better, but yesterday the pain at work was worse and it did not resolve at home. He presented to the ER when his pain worsened overnight. He does report that this feels similar to when he has had cardiac problems in the past. He also, however, does have a history of reflux for which he takes Protonix. He also has been drinking alcohol, apparently a reaction to his increased stress. No evidence of alcoholic complications. He does not have pancreatitis, LFT abnormalities. No evidence of GI bleeding. Abdominal exam is benign. Patient feeling improvement after aspirin and nitroglycerin. He does, however, continue to have some intermittent pains. Based on his significant cardiac history, I do not feel that he is a candidate for outpatient follow-up. I recommended observation in the hospital with cardiology consultation. Patient is in agreement with the plan.  Final Clinical Impressions(s) / ED Diagnoses   Final diagnoses:  Chest pain, unspecified type    New Prescriptions New Prescriptions   No medications on file     Orpah Greek, MD 12/22/16 [phone]

## 2016-12-23 ENCOUNTER — Observation Stay (HOSPITAL_BASED_OUTPATIENT_CLINIC_OR_DEPARTMENT_OTHER): Payer: Managed Care, Other (non HMO)

## 2016-12-23 DIAGNOSIS — R079 Chest pain, unspecified: Secondary | ICD-10-CM

## 2016-12-23 DIAGNOSIS — F4322 Adjustment disorder with anxiety: Secondary | ICD-10-CM

## 2016-12-23 DIAGNOSIS — I200 Unstable angina: Secondary | ICD-10-CM | POA: Diagnosis not present

## 2016-12-23 LAB — BASIC METABOLIC PANEL
Anion gap: 8 (ref 5–15)
BUN: 17 mg/dL (ref 6–20)
CALCIUM: 8.9 mg/dL (ref 8.9–10.3)
CO2: 27 mmol/L (ref 22–32)
Chloride: 102 mmol/L (ref 101–111)
Creatinine, Ser: 0.94 mg/dL (ref 0.61–1.24)
GFR calc Af Amer: >60 mL/min (ref >60)
Glucose, Bld: 95 mg/dL (ref 65–99)
POTASSIUM: 3.7 mmol/L (ref 3.5–5.1)
SODIUM: 137 mmol/L (ref 135–145)

## 2016-12-23 LAB — LIPID PANEL
CHOL/HDL RATIO: 3 ratio
Cholesterol: 160 mg/dL (ref 0–200)
HDL: 53 mg/dL (ref >40)
LDL CALC: 87 mg/dL (ref 0–99)
Triglycerides: 100 mg/dL (ref <150)
VLDL: 20 mg/dL (ref 0–40)

## 2016-12-23 LAB — NM MYOCAR MULTI W/SPECT W/WALL MOTION / EF
CHL CUP MPHR: 168 {beats}/min
CSEPEW: 11.7 METS
Exercise duration (min): 10 min
Peak HR: 160 {beats}/min
Percent HR: 95 %
Rest HR: 81 {beats}/min

## 2016-12-23 MED ORDER — SODIUM CHLORIDE 0.9% FLUSH
3.0000 mL | Freq: Two times a day (BID) | INTRAVENOUS | Status: DC
  Administered 2016-12-23: 3 mL via INTRAVENOUS

## 2016-12-23 MED ORDER — PANTOPRAZOLE SODIUM 40 MG PO TBEC: 40.0000 mg | DELAYED_RELEASE_TABLET | Freq: Every morning | ORAL | 0 refills | Status: DC

## 2016-12-23 MED ORDER — TECHNETIUM TC 99M TETROFOSMIN IV KIT
30.0000 | PACK | Freq: Once | INTRAVENOUS | Status: AC | PRN
Start: 2016-12-23 — End: 2016-12-23
  Administered 2016-12-23: 30 via INTRAVENOUS

## 2016-12-23 MED ORDER — THIAMINE HCL 100 MG PO TABS: 100.0000 mg | ORAL_TABLET | Freq: Every day | ORAL | 1 refills | Status: DC

## 2016-12-23 MED ORDER — SODIUM CHLORIDE 0.9% FLUSH: 3.0000 mL | INTRAVENOUS | Status: DC | PRN

## 2016-12-23 MED ORDER — SUCRALFATE 1 GM/10ML PO SUSP: 1.0000 g | Freq: Three times a day (TID) | ORAL | 0 refills | Status: DC

## 2016-12-23 MED ORDER — TECHNETIUM TC 99M TETROFOSMIN IV KIT
30.0000 | PACK | Freq: Once | INTRAVENOUS | Status: AC | PRN
  Administered 2016-12-23: 30 via INTRAVENOUS

## 2016-12-23 NOTE — Progress Notes (Signed)
Discharge instructions reviewed with patient.  These included (but were not limited to) the following:  Medication instructions ( including purpose and side effects ), follow-up appointments, when to call the physician, importance of diet ( including reducing alcohol intake) as part of discharge regimen, follow-up appointments, and activity.  Comprehension of instructions were ascertained via "teach-back" technique.  Patient discharged at 1605 to private residence via private automobile) and ambulated to exit with wife as per his request.

## 2016-12-23 NOTE — Discharge Summary (Signed)
Physician Discharge Summary  Anthony Moon MRN: [id number] DOB/AGE: 02-16-1966 51 y.o.  PCP: London Pepper, MD   Admit date: 12/22/2016 Discharge date: 12/23/2016  Discharge Diagnoses:    Principal Problem:   Chest pain, rule out acute myocardial infarction Active Problems:   TOBACCO ABUSE   CAD, NATIVE VESSEL   Adjustment disorder with anxiety   Chest pain, atypical   Hyperlipidemia LDL goal <70    Follow-up recommendations Follow-up with PCP in 3-5 days , including all  additional recommended appointments as below Follow-up CBC, CMP in 3-5 days No further cardiac work-up recommended. Follow-up with Dr. Angelena Form after discharge.      Current Discharge Medication List    START taking these medications   Details  sucralfate (CARAFATE) 1 GM/10ML suspension Take 10 mLs (1 g total) by mouth 4 (four) times daily -  with meals and at bedtime. Qty: 420 mL, Refills: 0    thiamine 100 MG tablet Take 1 tablet (100 mg total) by mouth daily. Qty: 30 tablet, Refills: 1      CONTINUE these medications which have CHANGED   Details  pantoprazole (PROTONIX) 40 MG tablet Take 1 tablet (40 mg total) by mouth every morning. Qty: 30 tablet, Refills: 0      CONTINUE these medications which have NOT CHANGED   Details  aspirin 81 MG tablet Take 1 tablet (81 mg total) by mouth daily. Qty: 30 tablet   Associated Diagnoses: CAD (coronary artery disease)    clopidogrel (PLAVIX) 75 MG tablet Take 1 tablet (75 mg total) by mouth daily. Qty: 90 tablet, Refills: 2    losartan (COZAAR) 100 MG tablet TAKE 1 TABLET BY MOUTH (100MG TOTAL) DAILY Qty: 90 tablet, Refills: 3    metoprolol (LOPRESSOR) 50 MG tablet Take 1 tablet (50 mg total) by mouth 2 (two) times daily. Qty: 180 tablet, Refills: 3   Associated Diagnoses: Essential hypertension    nitroGLYCERIN (NITROSTAT) 0.4 MG SL tablet Place 1 tablet (0.4 mg total) under the tongue every 5 (five) minutes as needed. Qty: 75 tablet, Refills: 3     simvastatin (ZOCOR) 40 MG tablet Take 1 tablet (40 mg total) by mouth at bedtime. Qty: 90 tablet, Refills: 3         Discharge Condition: stable  Discharge Instructions Get Medicines reviewed and adjusted: Please take all your medications with you for your next visit with your Primary MD  Please request your Primary MD to go over all hospital tests and procedure/radiological results at the follow up, please ask your Primary MD to get all Hospital records sent to his/her office.  If you experience worsening of your admission symptoms, develop shortness of breath, life threatening emergency, suicidal or homicidal thoughts you must seek medical attention immediately by calling 911 or calling your MD immediately if symptoms less severe.  You must read complete instructions/literature along with all the possible adverse reactions/side effects for all the Medicines you take and that have been prescribed to you. Take any new Medicines after you have completely understood and accpet all the possible adverse reactions/side effects.   Do not drive when taking Pain medications.   Do not take more than prescribed Pain, Sleep and Anxiety Medications  Special Instructions: If you have smoked or chewed Tobacco in the last 2 yrs please stop smoking, stop any regular Alcohol and or any Recreational drug use.  Wear Seat belts while driving.  Please note  You were cared for by a hospitalist during your hospital stay. Once  you are discharged, your primary care physician will handle any further medical issues. Please note that NO REFILLS for any discharge medications will be authorized once you are discharged, as it is imperative that you return to your primary care physician (or establish a relationship with a primary care physician if you do not have one) for your aftercare needs so that they can reassess your need for medications and monitor your lab values.  Discharge Instructions    Diet -  low sodium heart healthy    Complete by:  As directed    Increase activity slowly    Complete by:  As directed        No Known Allergies    Disposition: 01-Home or Self Care   Consults: * cardiology    Significant Diagnostic Studies:  Nm Myocar Multi W/spect W/wall Motion / Ef  Result Date: 12/23/2016 CLINICAL DATA:  Chest pain. Previous myocardial infarction, coronary angioplasty and stent placement. Hypertension, tobacco abuse. EXAM: MYOCARDIAL IMAGING WITH SPECT (REST AND EXERCISE) GATED LEFT VENTRICULAR WALL MOTION STUDY LEFT VENTRICULAR EJECTION FRACTION TECHNIQUE: Standard myocardial SPECT imaging was performed after resting intravenous injection of 10 mCi [id number]. Subsequently, exercise tolerance test was performed by the patient under the supervision of the Cardiology staff. Patient did achieved target heart rate. At peak-stress, 30 mCi [id number] was injected intravenously and standard myocardial SPECT imaging was performed. Quantitative gated imaging was also performed to evaluate left ventricular wall motion, and estimate left ventricular ejection fraction. COMPARISON:  None. FINDINGS: Perfusion: There is a small area of persistent moderately decreased activity involving the anterolateral region of the left ventricular myocardium. There is a moderate persistent area of mildly decreased activity involving the inferior wall. There is a moderate area of persistently decreased activity involving the septum. No suggestion of ischemia. Wall Motion: Septal hypokinesis. Otherwise normal myocardial thickening and motion. Left Ventricular Ejection Fraction: 49 % End diastolic volume 13970l End systolic volume 67 ml IMPRESSION: 1. No evidence of ischemia. Fixed anterolateral, inferior and septal defects as above . 2. Septal hypokinesis, otherwise normal left ventricular motion. 3. Left ventricular ejection fraction 49% 4. Intermediate-risk stress test findings*. *2012  Appropriate Use Criteria for Coronary Revascularization Focused Update: J Am Coll Cardiol. 202637;85(8):[email or web address] Electronically Signed   By: D Lucrezia Europe.D.   On: 12/23/2016 11:41   Dg Chest Port 1 View  Result Date: 12/22/2016 CLINICAL DATA:  Chest pain and right upper extremity pain, intermittent for several weeks. EXAM: PORTABLE CHEST 1 VIEW COMPARISON:  12/21/2013 FINDINGS: Shallow inspiration. The lungs are clear. The pulmonary vasculature is normal. Hilar and mediastinal contours are unremarkable unchanged. IMPRESSION: No acute cardiopulmonary findings. Electronically Signed   By: DaAndreas Newport.D.   On: 12/22/2016 05:33      1. No evidence of ischemia. Fixed anterolateral, inferior and septal defects as above .  2. Septal hypokinesis, otherwise normal left ventricular motion.  3. Left ventricular ejection fraction 49%  4. Intermediate-risk stress test findings  Filed Weights   12/22/16 0915 12/23/16 0629  Weight: 81.6 kg (180 lb) 77.7 kg (171 lb 3.2 oz)     Microbiology: No results found for this or any previous visit (from the past 240 hour(s)).     Blood Culture    Component Value Date/Time   SDES URINE, CLEAN CATCH 05/18/2010 1139   SPECREQUEST NONE 05/18/2010 1139   CULT NO GROWTH 05/18/2010 1139   REPTSTATUS 05/20/2010 FINAL 05/18/2010 1139      Labs: Results  for orders placed or performed during the hospital encounter of 12/22/16 (from the past 48 hour(s))  CBC with Differential/Platelet     Status: None   Collection Time: 12/22/16  4:41 AM  Result Value Ref Range   WBC 8.4 4.0 - 10.5 K/uL   RBC 4.75 4.22 - 5.81 MIL/uL   Hemoglobin 15.3 13.0 - 17.0 g/dL   HCT 44.1 39.0 - 52.0 %   MCV 92.8 78.0 - 100.0 fL   MCH 32.2 26.0 - 34.0 pg   MCHC 34.7 30.0 - 36.0 g/dL   RDW 13.3 11.5 - 15.5 %   Platelets 211 150 - 400 K/uL   Neutrophils Relative % 57 %   Neutro Abs 4.7 1.7 - 7.7 K/uL    Lymphocytes Relative 29 %   Lymphs Abs 2.4 0.7 - 4.0 K/uL   Monocytes Relative 11 %   Monocytes Absolute 0.9 0.1 - 1.0 K/uL   Eosinophils Relative 3 %   Eosinophils Absolute 0.3 0.0 - 0.7 K/uL   Basophils Relative 0 %   Basophils Absolute 0.0 0.0 - 0.1 K/uL  Comprehensive metabolic panel     Status: None   Collection Time: 12/22/16  4:41 AM  Result Value Ref Range   Sodium 137 135 - 145 mmol/L   Potassium 3.8 3.5 - 5.1 mmol/L   Chloride 103 101 - 111 mmol/L   CO2 23 22 - 32 mmol/L   Glucose, Bld 96 65 - 99 mg/dL   BUN 11 6 - 20 mg/dL   Creatinine, Ser 0.82 0.61 - 1.24 mg/dL   Calcium 9.0 8.9 - 10.3 mg/dL   Total Protein 7.2 6.5 - 8.1 g/dL   Albumin 3.9 3.5 - 5.0 g/dL   AST 27 15 - 41 U/L   ALT 29 17 - 63 U/L   Alkaline Phosphatase 56 38 - 126 U/L   Total Bilirubin 0.6 0.3 - 1.2 mg/dL   GFR calc non Af Amer >60 >60 mL/min   GFR calc Af Amer >60 >60 mL/min    Comment: (NOTE) The eGFR has been calculated using the CKD EPI equation. This calculation has not been validated in all clinical situations. eGFR's persistently <60 mL/min signify possible Chronic Kidney Disease.    Anion gap 11 5 - 15  Lipase, blood     Status: None   Collection Time: 12/22/16  4:41 AM  Result Value Ref Range   Lipase 31 11 - 51 U/L  I-stat troponin, ED     Status: None   Collection Time: 12/22/16  4:45 AM  Result Value Ref Range   Troponin i, poc 0.00 0.00 - 0.08 ng/mL   Comment 3            Comment: Due to the release kinetics of cTnI, a negative result within the first hours of the onset of symptoms does not rule out myocardial infarction with certainty. If myocardial infarction is still suspected, repeat the test at appropriate intervals.   Ethanol     Status: Abnormal   Collection Time: 12/22/16  4:54 AM  Result Value Ref Range   Alcohol, Ethyl (B) 261 (H) <5 mg/dL    Comment:        LOWEST DETECTABLE LIMIT FOR SERUM ALCOHOL IS 5 mg/dL FOR MEDICAL PURPOSES ONLY   Troponin I      Status: None   Collection Time: 12/22/16  7:56 AM  Result Value Ref Range   Troponin I <0.03 <0.03 ng/mL  D-dimer, quantitative (not at [facility])  Status: None   Collection Time: 12/22/16  7:56 AM  Result Value Ref Range   D-Dimer, Quant 0.27 0.00 - 0.50 ug/mL-FEU    Comment: (NOTE) At the manufacturer cut-off of 0.50 ug/mL FEU, this assay has been documented to exclude PE with a sensitivity and negative predictive value of 97 to 99%.  At this time, this assay has not been approved by the FDA to exclude DVT/VTE. Results should be correlated with clinical presentation.   HIV antibody (Routine Testing)     Status: None   Collection Time: 12/22/16  7:56 AM  Result Value Ref Range   HIV Screen 4th Generation wRfx Non Reactive Non Reactive    Comment: (NOTE) Performed At: [facility] [place], [place] [id number] Lindon Romp MD LG:[id number]   CBC     Status: None   Collection Time: 12/22/16  7:56 AM  Result Value Ref Range   WBC 7.3 4.0 - 10.5 K/uL   RBC 4.78 4.22 - 5.81 MIL/uL   Hemoglobin 15.0 13.0 - 17.0 g/dL   HCT 44.4 39.0 - 52.0 %   MCV 92.9 78.0 - 100.0 fL   MCH 31.4 26.0 - 34.0 pg   MCHC 33.8 30.0 - 36.0 g/dL   RDW 13.4 11.5 - 15.5 %   Platelets 211 150 - 400 K/uL  Creatinine, serum     Status: None   Collection Time: 12/22/16  7:56 AM  Result Value Ref Range   Creatinine, Ser 0.86 0.61 - 1.24 mg/dL   GFR calc non Af Amer >60 >60 mL/min   GFR calc Af Amer >60 >60 mL/min    Comment: (NOTE) The eGFR has been calculated using the CKD EPI equation. This calculation has not been validated in all clinical situations. eGFR's persistently <60 mL/min signify possible Chronic Kidney Disease.   Magnesium     Status: None   Collection Time: 12/22/16  7:56 AM  Result Value Ref Range   Magnesium 2.4 1.7 - 2.4 mg/dL  Lipase, blood     Status: None   Collection Time: 12/22/16  9:00 AM  Result Value Ref Range   Lipase 25 11 - 51 U/L  Urine rapid  drug screen (hosp performed)     Status: Abnormal   Collection Time: 12/22/16  4:18 PM  Result Value Ref Range   Opiates NONE DETECTED NONE DETECTED   Cocaine NONE DETECTED NONE DETECTED   Benzodiazepines POSITIVE (A) NONE DETECTED   Amphetamines NONE DETECTED NONE DETECTED   Tetrahydrocannabinol POSITIVE (A) NONE DETECTED   Barbiturates NONE DETECTED NONE DETECTED    Comment:        DRUG SCREEN FOR MEDICAL PURPOSES ONLY.  IF CONFIRMATION IS NEEDED FOR ANY PURPOSE, NOTIFY LAB WITHIN 5 DAYS.        LOWEST DETECTABLE LIMITS FOR URINE DRUG SCREEN Drug Class       Cutoff (ng/mL) Amphetamine      1000 Barbiturate      200 Benzodiazepine   814 Tricyclics       481 Opiates          300 Cocaine          300 THC              50   Basic metabolic panel     Status: None   Collection Time: 12/23/16  3:45 AM  Result Value Ref Range   Sodium 137 135 - 145 mmol/L   Potassium 3.7 3.5 - 5.1 mmol/L  Chloride 102 101 - 111 mmol/L   CO2 27 22 - 32 mmol/L   Glucose, Bld 95 65 - 99 mg/dL   BUN 17 6 - 20 mg/dL   Creatinine, Ser 0.94 0.61 - 1.24 mg/dL   Calcium 8.9 8.9 - 10.3 mg/dL   GFR calc non Af Amer >60 >60 mL/min   GFR calc Af Amer >60 >60 mL/min    Comment: (NOTE) The eGFR has been calculated using the CKD EPI equation. This calculation has not been validated in all clinical situations. eGFR's persistently <60 mL/min signify possible Chronic Kidney Disease.    Anion gap 8 5 - 15  Lipid panel     Status: None   Collection Time: 12/23/16  3:45 AM  Result Value Ref Range   Cholesterol 160 0 - 200 mg/dL   Triglycerides 100 <150 mg/dL   HDL 53 >40 mg/dL   Total CHOL/HDL Ratio 3.0 RATIO   VLDL 20 0 - 40 mg/dL   LDL Cholesterol 87 0 - 99 mg/dL    Comment:        Total Cholesterol/HDL:CHD Risk Coronary Heart Disease Risk Table                     Men   Women  1/2 Average Risk   3.4   3.3  Average Risk       5.0   4.4  2 X Average Risk   9.6   7.1  3 X Average Risk  23.4   11.0         Use the calculated Patient Ratio above and the CHD Risk Table to determine the patient's CHD Risk.        ATP III CLASSIFICATION (LDL):  <100     mg/dL   Optimal  100-129  mg/dL   Near or Above                    Optimal  130-159  mg/dL   Borderline  160-189  mg/dL   High  >190     mg/dL   Very High      Lipid Panel     Component Value Date/Time   CHOL 160 12/23/2016 0345   TRIG 100 12/23/2016 0345   HDL 53 12/23/2016 0345   CHOLHDL 3.0 12/23/2016 0345   VLDL 20 12/23/2016 0345   LDLCALC 87 12/23/2016 0345     Lab Results  Component Value Date   HGBA1C  05/18/2010    5.5 (NOTE)                                                                       According to the ADA Clinical Practice Recommendations for 2011, when HbA1c is used as a screening test:   >=6.5%   Diagnostic of Diabetes Mellitus           (if abnormal result  is confirmed)  5.7-6.4%   Increased risk of developing Diabetes Mellitus  References:Diagnosis and Classification of Diabetes Mellitus,Diabetes WERX,5400,86(PYPPJ 1):S62-S69 and Standards of Medical Care in         Diabetes - 2011,Diabetes Care,2011,34  (Suppl 1):S11-S61.     Lab Results  Component Value Date   LDLCALC 87 12/23/2016  CREATININE 0.94 12/23/2016     HPI      51 year old , with a history of hypertension, coronary artery disease, non-ST elevation MI, severe obstructive disease , status post drug-eluting stent, alcohol dependence who presents to the ER today with chest tightness and heartburn for the last 3 weeks.Patient has history of CAD with NSTEMI in 2011 with PCI of the prox-mid LAD and mild ischemic DCM with EF 45-50%, HTN and alcohol abuse. He also has a history of pericarditis. He continues to smoke. Has had CP on and off at work that he attributed to stress. This has been going on for several months. It is not like his typical angina. He was intoxicated on admission to ER and admitted to heavy drinking prior to  admission with CP. Initial trop negative and EKG showed NSR with early repolarization. Cardiology has been consulted   HOSPITAL COURSE:  Chest pain, rule out acute myocardial infarction, No history of coronary artery disease, status post drug-eluting stent in 2011,to LAD Chest pain atypical likely related to gastroesophageal reflux possible gastritis Telemetry uneventful Troponin's negative x 2.  LVEF calculated at 49% on nuclear images however,  suspect this is falsely low due to incoordinate septal motion and normal echo findings yesterday Cardiology consulted  lipid panel shows LDL of 87 Continue dual antiplatelet therapy long-term D-dimer 0.27 myoview today, which was negative for ischemia but mentioned numerous "fixed defects No further cardiac work-up recommended. Follow-up with Dr. Angelena Form after discharge.   Mild nonischemic cardiomyopathy, ejection fraction 45-50% at the time of myocardial infarction.Does not appear to be in CHF exacerbation, last known 2-D echo in 2011 LV EF now : 60% -   65% on 12/22/16  Tobacco abuse-nicotine patch will be provided  Gastroesophageal reflux disease Patient is on dual antiplatelet therapy and low-dose Protonix Increased  Protonix to 40 mg  a day, also start the patient on Carafate Patient may benefit from outpatient endoscopic evaluation   Hypertension.Hold Cozaar for now, blood pressure okay, resume her blood pressure increases  Hyperlipidemia.-Continue statin  Adjustment disorder with anxiety, alcohol dependence Started on  CIWA protocol and monitor  for withdrawal on telemetry, no withdrawal seen  PPI for gastritis longterm, increased dose of protonix to 40 mg /day    Discharge Exam:  Blood pressure (!) 146/100, pulse 90, temperature 97.9 F (36.6 C), temperature source Oral, resp. rate 16, height '5\' 11"'  (1.803 m), weight 77.7 kg (171 lb 3.2 oz), SpO2 96 %.  GEN:No acute distress.   Neck:No  JVD Cardiac:RRR, no murmurs, rubs, or gallops.  Respiratory:Clear to auscultation bilaterally. ES:PQZR, nontender, non-distended  MS:No edema; No deformity. Neuro:Nonfocal  Psych: Normal affect     Follow-up Information    London Pepper, MD. Call.   Specialty:  Family Medicine Why:  hospital follow up Contact information: [place] [address] [phone]        Lauree Chandler, MD. Schedule an appointment as soon as possible for a visit.   Specialty:  Cardiology Why:  in 1-2 weeks Contact information: [place]. [address] [place] [address] [phone]           Signed: Reyne Dumas 12/23/2016, 3:10 PM        Time spent >45 mins

## 2016-12-23 NOTE — Progress Notes (Signed)
Progress Note  Patient Name: Anthony Moon Date of Encounter: 12/23/2016  Primary Cardiologist: Dr. Angelena Form  Subjective   No complaints this am. No recurrent CP since being admitted.   Inpatient Medications    Scheduled Meds: . aspirin EC  81 mg Oral Daily  . clopidogrel  75 mg Oral Daily  . enoxaparin (LOVENOX) injection  40 mg Subcutaneous Daily  . folic acid  1 mg Oral Daily  . metoprolol  50 mg Oral BID  . multivitamin with minerals  1 tablet Oral Daily  . pantoprazole  40 mg Oral BID  . simvastatin  40 mg Oral QHS  . sucralfate  1 g Oral TID WC & HS  . thiamine  100 mg Oral Daily   Or  . thiamine  100 mg Intravenous Daily   Continuous Infusions:  PRN Meds: acetaminophen, ALPRAZolam, LORazepam **OR** LORazepam, morphine injection, nitroGLYCERIN, ondansetron (ZOFRAN) IV   Vital Signs    Vitals:   12/22/16 1807 12/22/16 2352 12/23/16 0629 12/23/16 0848  BP: 131/74 126/75 (!) 155/85 (!) 143/97  Pulse: 81 72 68 70  Resp: '18 16 16   '$ Temp: 98.5 F (36.9 C) 98.5 F (36.9 C) 97.9 F (36.6 C)   TempSrc: Oral Oral Oral   SpO2: 97% 96% 96%   Weight:   171 lb 3.2 oz (77.7 kg)   Height:        Intake/Output Summary (Last 24 hours) at 12/23/16 0857 Last data filed at 12/23/16 0630  Gross per 24 hour  Intake              480 ml  Output              300 ml  Net              180 ml   Filed Weights   12/22/16 0915 12/23/16 0629  Weight: 180 lb (81.6 kg) 171 lb 3.2 oz (77.7 kg)    Telemetry    NSR- Personally Reviewed  ECG    NSR - Personally Reviewed  Physical Exam   GEN: No acute distress.   Neck: No JVD Cardiac: RRR, no murmurs, rubs, or gallops.  Respiratory: Clear to auscultation bilaterally. GI: Soft, nontender, non-distended  MS: No edema; No deformity. Neuro:  Nonfocal  Psych: Normal affect   Labs    Chemistry Recent Labs Lab 12/22/16 0441 12/22/16 0756 12/23/16 0345  NA 137  --  137  K 3.8  --  3.7  CL 103  --  102  CO2 23  --  27    GLUCOSE 96  --  95  BUN 11  --  17  CREATININE 0.82 0.86 0.94  CALCIUM 9.0  --  8.9  PROT 7.2  --   --   ALBUMIN 3.9  --   --   AST 27  --   --   ALT 29  --   --   ALKPHOS 56  --   --   BILITOT 0.6  --   --   GFRNONAA >60 >60 >60  GFRAA >60 >60 >60  ANIONGAP 11  --  8     Hematology Recent Labs Lab 12/22/16 0441 12/22/16 0756  WBC 8.4 7.3  RBC 4.75 4.78  HGB 15.3 15.0  HCT 44.1 44.4  MCV 92.8 92.9  MCH 32.2 31.4  MCHC 34.7 33.8  RDW 13.3 13.4  PLT 211 211    Cardiac Enzymes Recent Labs Lab 12/22/16 0756  TROPONINI <  0.03    Recent Labs Lab 12/22/16 0445  TROPIPOC 0.00     BNPNo results for input(s): BNP, PROBNP in the last 168 hours.   DDimer  Recent Labs Lab 12/22/16 0756  DDIMER 0.27     Radiology    Dg Chest Port 1 View  Result Date: 12/22/2016 CLINICAL DATA:  Chest pain and right upper extremity pain, intermittent for several weeks. EXAM: PORTABLE CHEST 1 VIEW COMPARISON:  12/21/2013 FINDINGS: Shallow inspiration. The lungs are clear. The pulmonary vasculature is normal. Hilar and mediastinal contours are unremarkable unchanged. IMPRESSION: No acute cardiopulmonary findings. Electronically Signed   By: Andreas Newport M.D.   On: 12/22/2016 05:33    Cardiac Studies   NST - pending   Patient Profile     51 y/o male, followed by Dr. Angelena Form, with known CAD s/p NSTEMI in 2011 with PCI + DES x 2 to prox-mid LAD, mild LV dysfunction with EF of 45-50% in 2011, HTN and ETOH abuse, admitted by IM for chest pain evaluation.   Assessment & Plan    1. Chest Pain/CAD: He has known CAD with prior MI in 2011 and stenting of his prox-mid LAD at that time. EF 45-50%.  Also a h/o GERD.  No recurrence of CP since admission. Troponin's negative x 2. Exercise myoview completed. Radiologist interpretation pending. He exercised for a duration of 10 min, into stage 4 of Bruce Protocol w/o exertional CP or significant EKG changes. Target HR was 142. Max HR reached  was 160s. He had a hypertensive response with exercise. Continue home meds. Further recs will come once stress test is resulted.   2. HLD: LDL at 87 mg/dL. Goal in the setting of CAD is < 70. He reports full nightly compliance with simvastatin. Recommend change to Lipitor or Crestor.   Signed, Lyda Jester, PA-C  12/23/2016, 8:57 AM

## 2017-02-07 ENCOUNTER — Other Ambulatory Visit: Payer: Self-pay | Admitting: Cardiovascular Disease

## 2017-02-22 ENCOUNTER — Other Ambulatory Visit: Payer: Self-pay | Admitting: Cardiovascular Disease

## 2017-02-27 ENCOUNTER — Telehealth: Payer: Self-pay | Admitting: Cardiovascular Disease

## 2017-02-27 DIAGNOSIS — I10 Essential (primary) hypertension: Secondary | ICD-10-CM

## 2017-02-27 MED ORDER — METOPROLOL TARTRATE 50 MG PO TABS: 50.0000 mg | ORAL_TABLET | Freq: Two times a day (BID) | ORAL | 0 refills | Status: DC

## 2017-02-27 MED ORDER — LOSARTAN POTASSIUM 100 MG PO TABS: 100.0000 mg | ORAL_TABLET | Freq: Every day | ORAL | 0 refills | Status: DC

## 2017-02-27 MED ORDER — CLOPIDOGREL BISULFATE 75 MG PO TABS: 75.0000 mg | ORAL_TABLET | Freq: Every day | ORAL | 0 refills | Status: DC

## 2017-02-27 MED ORDER — NITROGLYCERIN 0.4 MG SL SUBL: 0.4000 mg | SUBLINGUAL_TABLET | SUBLINGUAL | 0 refills | Status: DC | PRN

## 2017-02-27 NOTE — Telephone Encounter (Signed)
New message    *STAT* If patient is at the pharmacy, call can be transferred to refill team.   1. Which medications need to be refilled? (please list name of each medication and dose if known) losartan (COZAAR) 100 MG tablet, nitroGLYCERIN (NITROSTAT) 0.4 MG SL tablet, metoprolol (LOPRESSOR) 50 MG tablet, clopidogrel (PLAVIX) 75 MG tablet  2. Which pharmacy/location (including street and city if local pharmacy) is medication to be sent to? [facility]  3. Do they need a 30 day or 90 day supply? 90 day supply.   Pt has appt schedule 7/30 at 9:20 with Dr. Angelena Form

## 2017-03-24 ENCOUNTER — Other Ambulatory Visit: Payer: Self-pay | Admitting: Cardiovascular Disease

## 2017-04-05 ENCOUNTER — Other Ambulatory Visit: Payer: Self-pay | Admitting: *Deleted

## 2017-04-05 ENCOUNTER — Telehealth: Payer: Self-pay | Admitting: Cardiovascular Disease

## 2017-04-05 MED ORDER — SIMVASTATIN 40 MG PO TABS: 40.0000 mg | ORAL_TABLET | Freq: Every day | ORAL | 0 refills | Status: DC

## 2017-04-05 NOTE — Telephone Encounter (Signed)
°*  STAT* If patient is at the pharmacy, call can be transferred to refill team.   1. Which medications need to be refilled? (please list name of each medication and dose if known) simvastatin 40 mg  2. Which pharmacy/location (including street and city if local pharmacy) is medication to be sent to?[facility] [place] [place] [facility] Portal to Registered [employer] Sites  3. Do they need a 30 day or 90 day supply? 90   Patient has one more day left of medication

## 2017-05-14 ENCOUNTER — Encounter (INDEPENDENT_AMBULATORY_CARE_PROVIDER_SITE_OTHER): Payer: Self-pay

## 2017-05-14 ENCOUNTER — Ambulatory Visit: Payer: Managed Care, Other (non HMO) | Admitting: Cardiovascular Disease

## 2017-07-09 ENCOUNTER — Other Ambulatory Visit: Payer: Self-pay

## 2017-07-09 DIAGNOSIS — I10 Essential (primary) hypertension: Secondary | ICD-10-CM

## 2017-07-09 MED ORDER — SIMVASTATIN 40 MG PO TABS: 40.0000 mg | ORAL_TABLET | Freq: Every day | ORAL | 0 refills | Status: DC

## 2017-07-09 MED ORDER — METOPROLOL TARTRATE 50 MG PO TABS: 50.0000 mg | ORAL_TABLET | Freq: Two times a day (BID) | ORAL | 3 refills | Status: DC

## 2017-07-09 MED ORDER — LOSARTAN POTASSIUM 100 MG PO TABS: 100.0000 mg | ORAL_TABLET | Freq: Every day | ORAL | 0 refills | Status: DC

## 2017-07-09 MED ORDER — CLOPIDOGREL BISULFATE 75 MG PO TABS: 75.0000 mg | ORAL_TABLET | Freq: Every day | ORAL | 0 refills | Status: DC

## 2017-07-10 ENCOUNTER — Other Ambulatory Visit: Payer: Self-pay | Admitting: Cardiovascular Disease

## 2017-07-10 DIAGNOSIS — I10 Essential (primary) hypertension: Secondary | ICD-10-CM

## 2017-07-10 MED ORDER — METOPROLOL TARTRATE 50 MG PO TABS: 50.0000 mg | ORAL_TABLET | Freq: Two times a day (BID) | ORAL | 0 refills | Status: DC

## 2017-08-20 ENCOUNTER — Encounter: Payer: Self-pay | Admitting: Cardiovascular Disease

## 2017-08-20 ENCOUNTER — Encounter (INDEPENDENT_AMBULATORY_CARE_PROVIDER_SITE_OTHER): Payer: Self-pay

## 2017-08-20 ENCOUNTER — Ambulatory Visit (INDEPENDENT_AMBULATORY_CARE_PROVIDER_SITE_OTHER): Payer: Managed Care, Other (non HMO) | Admitting: Cardiovascular Disease

## 2017-08-20 VITALS — BP 138/88 | HR 85 | Resp 16 | Ht 69.0 in | Wt 175.0 lb

## 2017-08-20 DIAGNOSIS — I2510 Atherosclerotic heart disease of native coronary artery without angina pectoris: Secondary | ICD-10-CM

## 2017-08-20 DIAGNOSIS — E7800 Pure hypercholesterolemia, unspecified: Secondary | ICD-10-CM

## 2017-08-20 DIAGNOSIS — I10 Essential (primary) hypertension: Secondary | ICD-10-CM

## 2017-08-20 DIAGNOSIS — Z720 Tobacco use: Secondary | ICD-10-CM | POA: Diagnosis not present

## 2017-08-20 MED ORDER — HYDROCHLOROTHIAZIDE 25 MG PO TABS: 25.0000 mg | ORAL_TABLET | Freq: Every day | ORAL | 3 refills | Status: DC

## 2017-08-20 MED ORDER — CLOPIDOGREL BISULFATE 75 MG PO TABS: 75.0000 mg | ORAL_TABLET | Freq: Every day | ORAL | 3 refills | Status: DC

## 2017-08-20 MED ORDER — LOSARTAN POTASSIUM 100 MG PO TABS: 100.0000 mg | ORAL_TABLET | Freq: Every day | ORAL | 3 refills | Status: DC

## 2017-08-20 MED ORDER — SIMVASTATIN 40 MG PO TABS: 40.0000 mg | ORAL_TABLET | Freq: Every day | ORAL | 3 refills | Status: DC

## 2017-08-20 MED ORDER — PANTOPRAZOLE SODIUM 40 MG PO TBEC: 40.0000 mg | DELAYED_RELEASE_TABLET | Freq: Every morning | ORAL | 3 refills | Status: DC

## 2017-08-20 MED ORDER — METOPROLOL TARTRATE 50 MG PO TABS: 50.0000 mg | ORAL_TABLET | Freq: Two times a day (BID) | ORAL | 3 refills | Status: DC

## 2017-08-20 NOTE — Progress Notes (Signed)
Chief Complaint  Patient presents with  . Follow-up    hypertension   History of Present Illness: 51 yo male with history of tobacco abuse, HTN and CAD who is here today for cardiac follow up. He was admitted to [facility] in August 2011 with a NSTEMI and was found to have severe obstructive disease in the LAD. Two drug eluting stents were placed in the LAD. Mild hypokinesis of the anterolateral wall. No stress testing or f/u echo since then as pt has refused due to finances. Seen in the ED March 2015 with chest pain after drinking a beer. Troponin was negative. Felt to be GI related. Started on PPI. Admitted to [facility] March 2018 with chest pain after an episode of binge drinking. Nuclear stress test March 2018 without ischemia. Echo March 2018 with normal LV function, LVEF=60-65%. No significant valve disease.   He is here today for follow up. The patient denies any chest pain, dyspnea, palpitations, lower extremity edema, orthopnea, PND, dizziness, near syncope or syncope.   Primary Care Physician: London Pepper, MD   Past Medical History:  Diagnosis Date  . Bilateral recurrent inguinal hernia   . CAD (coronary artery disease) cardiologist-  dr Angelena Form   NSTEMI--- s/p cath 05/18/2010 DES  x2  to mLAD and pLAD not overlapping  . GERD (gastroesophageal reflux disease)   . History of non-ST elevation myocardial infarction (NSTEMI)    05-18-2010  s/p  PCI and DES x2 to LAD  . History of pericarditis    12-23-2010  resolved  . Hypertension   . Nonischemic cardiomyopathy ([place])    last ef 40-45% per echo 2011  . Rash    mid chest  . S/P drug eluting coronary stent placement    05-18-2010  to mLAD and pLAD  . Seasonal allergies   . Smokers' cough [facility])     Past Surgical History:  Procedure Laterality Date  . CORONARY ANGIOPLASTY WITH STENT PLACEMENT  05-18-2010   dr Angelena Form   PCI and DES x2 to mLAD and pLAD (not overlapping)/  hypokinesis of anterolateral wall and apex w/ ef  50%/  mild nonobstuctive cad in RCA and CFX  . INGUINAL HERNIA REPAIR Bilateral age 89  . MANDIBLE FRACTURE SURGERY Left 1990's  . TRANSTHORACIC ECHOCARDIOGRAM  05-18-2010   septal, apical, and inferoapical hypokinesis,  ef 40-45%/  trivial MR/  mild TR    Current Outpatient Medications  Medication Sig Dispense Refill  . aspirin 81 MG tablet Take 1 tablet (81 mg total) by mouth daily. 30 tablet   . clopidogrel (PLAVIX) 75 MG tablet Take 1 tablet (75 mg total) daily by mouth. 90 tablet 3  . losartan (COZAAR) 100 MG tablet Take 1 tablet (100 mg total) daily by mouth. 90 tablet 3  . metoprolol tartrate (LOPRESSOR) 50 MG tablet Take 1 tablet (50 mg total) 2 (two) times daily by mouth. 180 tablet 3  . nitroGLYCERIN (NITROSTAT) 0.4 MG SL tablet Place 1 tablet (0.4 mg total) under the tongue every 5 (five) minutes as needed. 25 tablet 0  . pantoprazole (PROTONIX) 40 MG tablet Take 1 tablet (40 mg total) every morning by mouth. 90 tablet 3  . simvastatin (ZOCOR) 40 MG tablet Take 1 tablet (40 mg total) at bedtime by mouth. 90 tablet 3  . thiamine 100 MG tablet Take 1 tablet (100 mg total) by mouth daily. 30 tablet 1  . hydrochlorothiazide (HYDRODIURIL) 25 MG tablet Take 1 tablet (25 mg total) daily by  mouth. 90 tablet 3  . sucralfate (CARAFATE) 1 GM/10ML suspension Take 10 mLs (1 g total) by mouth 4 (four) times daily -  with meals and at bedtime. 420 mL 0   No current facility-administered medications for this visit.     No Known Allergies  Social History   Socioeconomic History  . Marital status: Married    Spouse name: Not on file  . Number of children: 3  . Years of education: Not on file  . Highest education level: Not on file  Social Needs  . Financial resource strain: Not on file  . Food insecurity - worry: Not on file  . Food insecurity - inability: Not on file  . Transportation needs - medical: Not on file  . Transportation needs - non-medical: Not on file  Occupational History   . Occupation: student    Comment: [employer]  . Occupation: gas station attendant  Tobacco Use  . Smoking status: Current Every Day Smoker    Packs/day: 0.50    Years: 40.00    Pack years: 20.00    Types: Cigarettes  . Smokeless tobacco: Never Used  Substance and Sexual Activity  . Alcohol use: Yes    Alcohol/week: 8.4 oz    Types: 14 Cans of beer per week    Comment: 6 beers  . Drug use: No    Comment: per pt quit long time ago  . Sexual activity: Not on file  Other Topics Concern  . Not on file  Social History Narrative  . Not on file    Family History  Problem Relation Age of Onset  . Coronary artery disease Father     Review of Systems:  As stated in the HPI and otherwise negative.   BP 138/88   Pulse 85   Resp 16   Ht 5\' 9"  (1.753 m)   Wt 175 lb (79.4 kg)   SpO2 97%   BMI 25.84 kg/m   Physical Examination:  General: Well developed, well nourished, NAD  HEENT: OP clear, mucus membranes moist  SKIN: warm, dry. No rashes. Neuro: No focal deficits  Musculoskeletal: Muscle strength 5/5 all ext  Psychiatric: Mood and affect normal  Neck: No JVD, no carotid bruits, no thyromegaly, no lymphadenopathy.  Lungs:Clear bilaterally, no wheezes, rhonci, crackles Cardiovascular: Regular rate and rhythm. No murmurs, gallops or rubs. Abdomen:Soft. Bowel sounds present. Non-tender.  Extremities: No lower extremity edema. Pulses are 2 + in the bilateral DP/PT.  Echo march 2018: Left ventricle: The cavity size was normal. There was mild   concentric hypertrophy. Systolic function was normal. The   estimated ejection fraction was in the range of 60% to 65%. Wall   motion was normal; there were no regional wall motion   abnormalities. Doppler parameters are consistent with abnormal   left ventricular relaxation (grade 1 diastolic dysfunction). - Aortic valve: There was no regurgitation. - Mitral valve: Transvalvular velocity was within the normal range.   There was no  evidence for stenosis. There was trivial   regurgitation. - Right ventricle: The cavity size was normal. Wall thickness was   normal. Systolic function was normal. - Tricuspid valve: There was mild regurgitation. - Pulmonary arteries: Systolic pressure was within the normal   range. PA peak pressure: 30 mm Hg (S).  EKG:  EKG is not ordered today. The ekg ordered today demonstrates   Recent Labs: 12/22/2016: ALT 29; Hemoglobin 15.0; Magnesium 2.4; Platelets 211 12/23/2016: BUN 17; Creatinine, Ser 0.94; Potassium 3.7; Sodium  137   Lipid Panel    Component Value Date/Time   CHOL 160 12/23/2016 0345   TRIG 100 12/23/2016 0345   HDL 53 12/23/2016 0345   CHOLHDL 3.0 12/23/2016 0345   VLDL 20 12/23/2016 0345   LDLCALC 87 12/23/2016 0345     Wt Readings from Last 3 Encounters:  08/20/17 175 lb (79.4 kg)  12/23/16 171 lb 3.2 oz (77.7 kg)  01/19/16 170 lb 12.8 oz (77.5 kg)     Other studies Reviewed: Additional studies/ records that were reviewed today include: . Review of the above records demonstrates:    Assessment and Plan:   1. CAD without angina: He has no chest pain suggestive of angina. Stress test in march 2018 with no ischemia. Echo march 2018 with normal LV function. Continue ASA, Plavix, statin, ARB and beta blocker.    2. HTN: BP is elevated today and has been at home. Will add Norvasc 5 mg po Qdaily.   3. Hyperlipidemia: LDL near goal. Continue statin.   4. Tobacco abuse: Smoking cessation advised.   Current medicines are reviewed at length with the patient today.  The patient does not have concerns regarding medicines.  The following changes have been made:  no change  Labs/ tests ordered today include:  No orders of the defined types were placed in this encounter.   Disposition:   FU with me in 12  months  Signed, Lauree Chandler, MD 08/20/2017 3:51 PM    [facility] [place], [place], [place]  [address] Phone: [phone]; Fax: [phone]

## 2017-08-20 NOTE — Patient Instructions (Addendum)
Medication Instructions:  Your physician has recommended you make the following change in your medication:  Start hydrochlorothiazide 25 mg by mouth daily    Labwork: none  Testing/Procedures: none  Follow-Up: Your physician recommends that you schedule a follow-up appointment in: 12 months. Please call our office in about 9 months to schedule this appointment    Any Other Special Instructions Will Be Listed Below (If Applicable).     If you need a refill on your cardiac medications before your next appointment, please call your pharmacy.

## 2017-08-28 ENCOUNTER — Other Ambulatory Visit: Payer: Self-pay | Admitting: Cardiovascular Disease

## 2017-08-28 NOTE — Telephone Encounter (Signed)
Medication Detail    Disp Refills Start End   losartan (COZAAR) 100 MG tablet 90 tablet 3 08/20/2017    Sig - Route: Take 1 tablet (100 mg total) daily by mouth. - Oral   Sent to pharmacy as: losartan (COZAAR) 100 MG tablet   E-Prescribing Status: Receipt confirmed by pharmacy (08/20/2017 3:33 PM EST)   Pharmacy   [facility] [place], [place]   Medication Detail    Disp Refills Start End   clopidogrel (PLAVIX) 75 MG tablet 90 tablet 3 08/20/2017    Sig - Route: Take 1 tablet (75 mg total) daily by mouth. - Oral   Sent to pharmacy as: clopidogrel (PLAVIX) 75 MG tablet   E-Prescribing Status: Receipt confirmed by pharmacy (08/20/2017 3:33 PM EST)   Pharmacy   [facility], [place]

## 2018-06-04 ENCOUNTER — Emergency Department (HOSPITAL_COMMUNITY): Payer: Managed Care, Other (non HMO)

## 2018-06-04 ENCOUNTER — Emergency Department (HOSPITAL_COMMUNITY)
Admission: EM | Admit: 2018-06-04 | Discharge: 2018-06-04 | Disposition: A | Discharge status: Home / Self Care | Payer: Managed Care, Other (non HMO) | Attending: Emergency Medicine | Admitting: Emergency Medicine

## 2018-06-04 ENCOUNTER — Other Ambulatory Visit: Payer: Self-pay

## 2018-06-04 DIAGNOSIS — R079 Chest pain, unspecified: Secondary | ICD-10-CM | POA: Diagnosis present

## 2018-06-04 DIAGNOSIS — Z5321 Procedure and treatment not carried out due to patient leaving prior to being seen by health care provider: Secondary | ICD-10-CM | POA: Diagnosis not present

## 2018-06-04 LAB — BASIC METABOLIC PANEL
Anion gap: 14 (ref 5–15)
BUN: 11 mg/dL (ref 6–20)
CALCIUM: 9.7 mg/dL (ref 8.9–10.3)
CO2: 20 mmol/L — ABNORMAL LOW (ref 22–32)
Chloride: 100 mmol/L (ref 98–111)
Creatinine, Ser: 0.78 mg/dL (ref 0.61–1.24)
GFR calc Af Amer: >60 mL/min (ref >60)
GLUCOSE: 76 mg/dL (ref 70–99)
Potassium: 3.6 mmol/L (ref 3.5–5.1)
Sodium: 134 mmol/L — ABNORMAL LOW (ref 135–145)

## 2018-06-04 LAB — CBC
HEMATOCRIT: 44.8 % (ref 39.0–52.0)
Hemoglobin: 15.3 g/dL (ref 13.0–17.0)
MCH: 33.1 pg (ref 26.0–34.0)
MCHC: 34.2 g/dL (ref 30.0–36.0)
MCV: 97 fL (ref 78.0–100.0)
Platelets: 289 10*3/uL (ref 150–400)
RBC: 4.62 MIL/uL (ref 4.22–5.81)
RDW: 12.3 % (ref 11.5–15.5)
WBC: 8.8 10*3/uL (ref 4.0–10.5)

## 2018-06-04 LAB — I-STAT TROPONIN, ED: TROPONIN I, POC: 0 ng/mL (ref 0.00–0.08)

## 2018-06-04 MED ORDER — NITROGLYCERIN 0.4 MG SL SUBL: 0.4000 mg | SUBLINGUAL_TABLET | SUBLINGUAL | Status: DC | PRN

## 2018-06-04 NOTE — ED Notes (Signed)
Pt stated he was leaving the emergency room. Pt states that he feels better. This ED Tech tried to advise pt to stay. Pt stated if symptoms reoccurred he would return to Emergency Department.

## 2018-06-04 NOTE — ED Notes (Signed)
Pt still experiencing SOB. Vitals taken and found to be wnl. Lung sounds clear high and low. Bent over breathing heavily at normal rate, flush all over, and non-diaphoretic. Pt reports being very anxious. Offered supplemental O2 or moving pt to quite area. Pt refuses.

## 2018-06-04 NOTE — ED Triage Notes (Signed)
Pt reports central CP radiating to L shoulder and SHOB starting today around 2 hours ago. Pt reports worsening since. Pt has hx of MI and stent placement.

## 2018-06-04 NOTE — ED Provider Notes (Addendum)
Patient placed in Quick Look pathway, seen and evaluated   Chief Complaint: chest pain  HPI:   Sudden onset substernal chest discomfort describes as heart burn at 2 pm while sitting in car with radiating to left shoulder, associated with SOB. Feels similar to previous three MI.  H/o stents. Took aspirin today. Does not like nitroglycerin. 7/10 currently.   ROS: no fevers, chills, diaphoresis, nausea, vomiting.   Physical Exam:   Gen: No distress  Neuro: Awake and Alert  Skin: Warm    Focused Exam: Looks anxious. RRR. Lungs CTAB.   Initiation of care has begun. The patient has been counseled on the process, plan, and necessity for staying for the completion/evaluation, and the remainder of the medical screening examination      Kinnie Feil, PA-C 06/04/18 1652    Kinnie Feil, PA-C 06/12/18 1025    Noemi Chapel, MD 06/15/18 [phone]

## 2018-08-22 ENCOUNTER — Other Ambulatory Visit: Payer: Self-pay | Admitting: Cardiovascular Disease

## 2018-08-22 MED ORDER — LOSARTAN POTASSIUM 100 MG PO TABS: 100.0000 mg | ORAL_TABLET | Freq: Every day | ORAL | 0 refills | Status: DC

## 2018-08-22 MED ORDER — CLOPIDOGREL BISULFATE 75 MG PO TABS: 75.0000 mg | ORAL_TABLET | Freq: Every day | ORAL | 0 refills | Status: DC

## 2018-08-22 MED ORDER — HYDROCHLOROTHIAZIDE 25 MG PO TABS: 25.0000 mg | ORAL_TABLET | Freq: Every day | ORAL | 0 refills | Status: DC

## 2018-08-22 NOTE — Telephone Encounter (Signed)
 *  STAT* If patient is at the pharmacy, call can be transferred to refill team.   1. Which medications need to be refilled? (please list name of each medication and dose if known)   losartan (COZAAR) 100 MG tablet clopidogrel (PLAVIX) 75 MG tablet hydrochlorothiazide (HYDRODIURIL) 25 MG tablet   2. Which pharmacy/location (including street and city if local pharmacy) is medication to be sent to [facility]  3. Do they need a 30 day or 90 day supply? 90 day for all

## 2018-08-22 NOTE — Telephone Encounter (Signed)
Pt's medications was sent to pt's pharmacy as requested. Confirmation received.  

## 2018-10-23 ENCOUNTER — Other Ambulatory Visit: Payer: Self-pay | Admitting: Cardiovascular Disease

## 2018-11-12 ENCOUNTER — Other Ambulatory Visit: Payer: Self-pay | Admitting: Cardiovascular Disease

## 2018-11-14 ENCOUNTER — Telehealth: Payer: Self-pay | Admitting: *Deleted

## 2018-11-14 NOTE — Telephone Encounter (Signed)
Prior authorization done for PANTOPRAZOLE requested by [facility] and faxed.

## 2018-11-15 NOTE — Telephone Encounter (Signed)
**Note De-Identified Paulita Licklider Obfuscation** Letter received from [facility] Marquitta Persichetti fax stating that they have denied this Pantoprazole PA. Reason: Pantoprazole is excluded from coverage. This decision is not based on the medical necessity or clinical appropriateness of the medication. The pts benefit plan simply does not cover this medication, no matter what the reason is that it is being requested.  The pt has not been seen for a cardiology f/u since 08/20/17.  I have notified the pt pharmacy of this denial.

## 2018-12-09 ENCOUNTER — Ambulatory Visit: Payer: Managed Care, Other (non HMO) | Admitting: Cardiovascular Disease

## 2018-12-09 ENCOUNTER — Encounter: Payer: Self-pay | Admitting: Cardiovascular Disease

## 2018-12-09 VITALS — BP 142/84 | HR 79 | Ht 70.0 in | Wt 162.2 lb

## 2018-12-09 DIAGNOSIS — Z720 Tobacco use: Secondary | ICD-10-CM

## 2018-12-09 DIAGNOSIS — I2510 Atherosclerotic heart disease of native coronary artery without angina pectoris: Secondary | ICD-10-CM | POA: Diagnosis not present

## 2018-12-09 DIAGNOSIS — E7800 Pure hypercholesterolemia, unspecified: Secondary | ICD-10-CM | POA: Diagnosis not present

## 2018-12-09 DIAGNOSIS — I10 Essential (primary) hypertension: Secondary | ICD-10-CM

## 2018-12-09 MED ORDER — LOSARTAN POTASSIUM 100 MG PO TABS: 100.0000 mg | ORAL_TABLET | Freq: Every day | ORAL | 3 refills | Status: DC

## 2018-12-09 MED ORDER — METOPROLOL TARTRATE 50 MG PO TABS: 50.0000 mg | ORAL_TABLET | Freq: Two times a day (BID) | ORAL | 3 refills | Status: DC

## 2018-12-09 MED ORDER — HYDROCHLOROTHIAZIDE 25 MG PO TABS: 25.0000 mg | ORAL_TABLET | Freq: Every day | ORAL | 3 refills | Status: DC

## 2018-12-09 MED ORDER — NITROGLYCERIN 0.4 MG SL SUBL: 0.4000 mg | SUBLINGUAL_TABLET | SUBLINGUAL | 6 refills | Status: AC | PRN

## 2018-12-09 MED ORDER — CLOPIDOGREL BISULFATE 75 MG PO TABS: 75.0000 mg | ORAL_TABLET | Freq: Every day | ORAL | 3 refills | Status: AC

## 2018-12-09 MED ORDER — SIMVASTATIN 40 MG PO TABS: 40.0000 mg | ORAL_TABLET | Freq: Every day | ORAL | 3 refills | Status: AC

## 2018-12-09 NOTE — Progress Notes (Signed)
Chief Complaint  Patient presents with  . Follow-up    CAD   History of Present Illness: 53 yo male with history of tobacco abuse, HTN and CAD who is here today for cardiac follow up. He was admitted to [facility] in August 2011 with a NSTEMI and was found to have severe obstructive disease in the LAD. Two drug eluting stents were placed in the LAD. Mild hypokinesis of the anterolateral wall. He was admitted to [facility] March 2018 with chest pain after an episode of binge drinking. Nuclear stress test March 2018 without ischemia. Echo March 2018 with normal LV function, LVEF=60-65%. No significant valve disease.   He is here today for follow up. The patient denies any chest pain, dyspnea, palpitations, lower extremity edema, orthopnea, PND, dizziness, near syncope or syncope. He has had a cough for 2 months. He has been diagnosed with bronchitis.    Primary Care Physician: London Pepper, MD  Past Medical History:  Diagnosis Date  . Bilateral recurrent inguinal hernia   . CAD (coronary artery disease) cardiologist-  dr Angelena Form   NSTEMI--- s/p cath 05/18/2010 DES  x2  to mLAD and pLAD not overlapping  . GERD (gastroesophageal reflux disease)   . History of non-ST elevation myocardial infarction (NSTEMI)    05-18-2010  s/p  PCI and DES x2 to LAD  . History of pericarditis    12-23-2010  resolved  . Hypertension   . Nonischemic cardiomyopathy ([place])    last ef 40-45% per echo 2011  . Rash    mid chest  . S/P drug eluting coronary stent placement    05-18-2010  to mLAD and pLAD  . Seasonal allergies   . Smokers' cough [facility])     Past Surgical History:  Procedure Laterality Date  . CORONARY ANGIOPLASTY WITH STENT PLACEMENT  05-18-2010   dr Angelena Form   PCI and DES x2 to mLAD and pLAD (not overlapping)/  hypokinesis of anterolateral wall and apex w/ ef 50%/  mild nonobstuctive cad in RCA and CFX  . INGUINAL HERNIA REPAIR Bilateral age 37  . INGUINAL HERNIA REPAIR Bilateral 12/21/2015     Procedure: LAPAROSCOPIC BILATERAL INGUINAL HERNIA REPAIR W/MESH ;  Surgeon: Arta Bruce Kinsinger, MD;  Location: [place];  Service: General;  Laterality: Bilateral;  . MANDIBLE FRACTURE SURGERY Left 1990's  . TRANSTHORACIC ECHOCARDIOGRAM  05-18-2010   septal, apical, and inferoapical hypokinesis,  ef 40-45%/  trivial MR/  mild TR    Current Outpatient Medications  Medication Sig Dispense Refill  . aspirin 81 MG tablet Take 1 tablet (81 mg total) by mouth daily. 30 tablet   . clopidogrel (PLAVIX) 75 MG tablet Take 1 tablet (75 mg total) by mouth daily. 90 tablet 3  . hydrochlorothiazide (HYDRODIURIL) 25 MG tablet Take 1 tablet (25 mg total) by mouth daily. 90 tablet 3  . losartan (COZAAR) 100 MG tablet Take 1 tablet (100 mg total) by mouth daily. 90 tablet 3  . metoprolol tartrate (LOPRESSOR) 50 MG tablet Take 1 tablet (50 mg total) by mouth 2 (two) times daily. 180 tablet 3  . nitroGLYCERIN (NITROSTAT) 0.4 MG SL tablet Place 1 tablet (0.4 mg total) under the tongue every 5 (five) minutes as needed. 25 tablet 6  . omeprazole (PRILOSEC) 40 MG capsule Take 40 mg by mouth daily.    . simvastatin (ZOCOR) 40 MG tablet Take 1 tablet (40 mg total) by mouth at bedtime. 90 tablet 3  . sucralfate (CARAFATE) 1 GM/10ML  suspension Take 10 mLs (1 g total) by mouth 4 (four) times daily -  with meals and at bedtime. 420 mL 0  . thiamine 100 MG tablet Take 1 tablet (100 mg total) by mouth daily. 30 tablet 1   No current facility-administered medications for this visit.     No Known Allergies  Social History   Socioeconomic History  . Marital status: Married    Spouse name: Not on file  . Number of children: 3  . Years of education: Not on file  . Highest education level: Not on file  Occupational History  . Occupation: student    Comment: [employer]  . Occupation: gas station attendant  Social Needs  . Financial resource strain: Not on file  . Food insecurity:    Worry: Not on  file    Inability: Not on file  . Transportation needs:    Medical: Not on file    Non-medical: Not on file  Tobacco Use  . Smoking status: Current Every Day Smoker    Packs/day: 0.50    Years: 40.00    Pack years: 20.00    Types: Cigarettes  . Smokeless tobacco: Never Used  Substance and Sexual Activity  . Alcohol use: Yes    Alcohol/week: 14.0 standard drinks    Types: 14 Cans of beer per week    Comment: 6 beers  . Drug use: No    Types: Marijuana    Comment: per pt quit long time ago  . Sexual activity: Not on file  Lifestyle  . Physical activity:    Days per week: Not on file    Minutes per session: Not on file  . Stress: Not on file  Relationships  . Social connections:    Talks on phone: Not on file    Gets together: Not on file    Attends religious service: Not on file    Active member of club or organization: Not on file    Attends meetings of clubs or organizations: Not on file    Relationship status: Not on file  . Intimate partner violence:    Fear of current or ex partner: Not on file    Emotionally abused: Not on file    Physically abused: Not on file    Forced sexual activity: Not on file  Other Topics Concern  . Not on file  Social History Narrative  . Not on file    Family History  Problem Relation Age of Onset  . Coronary artery disease Father     Review of Systems:  As stated in the HPI and otherwise negative.   BP (!) 142/84   Pulse 79   Ht 5\' 10"  (1.778 m)   Wt 162 lb 3.2 oz (73.6 kg)   SpO2 98%   BMI 23.27 kg/m   Physical Examination:  General: Well developed, well nourished, NAD  HEENT: OP clear, mucus membranes moist  SKIN: warm, dry. No rashes. Neuro: No focal deficits  Musculoskeletal: Muscle strength 5/5 all ext  Psychiatric: Mood and affect normal  Neck: No JVD, no carotid bruits, no thyromegaly, no lymphadenopathy.  Lungs:Clear bilaterally, no wheezes, rhonci, crackles Cardiovascular: Regular rate and rhythm. No  murmurs, gallops or rubs. Abdomen:Soft. Bowel sounds present. Non-tender.  Extremities: No lower extremity edema. Pulses are 2 + in the bilateral DP/PT.  Echo march 2018: Left ventricle: The cavity size was normal. There was mild   concentric hypertrophy. Systolic function was normal. The   estimated ejection  fraction was in the range of 60% to 65%. Wall   motion was normal; there were no regional wall motion   abnormalities. Doppler parameters are consistent with abnormal   left ventricular relaxation (grade 1 diastolic dysfunction). - Aortic valve: There was no regurgitation. - Mitral valve: Transvalvular velocity was within the normal range.   There was no evidence for stenosis. There was trivial   regurgitation. - Right ventricle: The cavity size was normal. Wall thickness was   normal. Systolic function was normal. - Tricuspid valve: There was mild regurgitation. - Pulmonary arteries: Systolic pressure was within the normal   range. PA peak pressure: 30 mm Hg (S).  EKG:  EKG is not  ordered today. The ekg ordered today demonstrates   Recent Labs: 06/04/2018: BUN 11; Creatinine, Ser 0.78; Hemoglobin 15.3; Platelets 289; Potassium 3.6; Sodium 134   Lipid Panel    Component Value Date/Time   CHOL 160 12/23/2016 0345   TRIG 100 12/23/2016 0345   HDL 53 12/23/2016 0345   CHOLHDL 3.0 12/23/2016 0345   VLDL 20 12/23/2016 0345   LDLCALC 87 12/23/2016 0345     Wt Readings from Last 3 Encounters:  12/09/18 162 lb 3.2 oz (73.6 kg)  06/04/18 158 lb (71.7 kg)  08/20/17 175 lb (79.4 kg)     Other studies Reviewed: Additional studies/ records that were reviewed today include: . Review of the above records demonstrates:    Assessment and Plan:   1. CAD without angina: No chest pain. Stress test in march 2018 with no ischemia. Echo march 2018 with normal LV function. Will continue ASA, Plavix, beta blocker and statin.     2. HTN: BP is well controlled.  No changes.   3.  Hyperlipidemia: LDL near goal in primary care August 2019. Continue statin.   4. Tobacco abuse: Complete tobacco cessation is advised.   Current medicines are reviewed at length with the patient today.  The patient does not have concerns regarding medicines.  The following changes have been made:  no change  Labs/ tests ordered today include:  No orders of the defined types were placed in this encounter.   Disposition:   FU with me in 12  months  Signed, Lauree Chandler, MD 12/09/2018 9:19 AM    [facility] [place], [place], [place]  [address] Phone: [phone]; Fax: [phone]

## 2018-12-09 NOTE — Patient Instructions (Signed)
Medication Instructions:  Your physician recommends that you continue on your current medications as directed. Please refer to the Current Medication list given to you today.  If you need a refill on your cardiac medications before your next appointment, please call your pharmacy.   Lab work: none If you have labs (blood work) drawn today and your tests are completely normal, you will receive your results only by: . MyChart Message (if you have MyChart) OR . A paper copy in the mail If you have any lab test that is abnormal or we need to change your treatment, we will call you to review the results.  Testing/Procedures: none  Follow-Up: At [facility], you and your health needs are our priority.  As part of our continuing mission to provide you with exceptional heart care, we have created designated Provider Care Teams.  These Care Teams include your primary Cardiologist (physician) and Advanced Practice Providers (APPs -  Physician Assistants and Nurse Practitioners) who all work together to provide you with the care you need, when you need it. You will need a follow up appointment in 12 months.  Please call our office 4 months in advance to schedule this appointment.  You may see Christopher McAlhany, MD or one of the following Advanced Practice Providers on your designated Care Team:   Brittainy Simmons, PA-C Dayna Dunn, PA-C . Michele Lenze, PA-C  Any Other Special Instructions Will Be Listed Below (If Applicable).    

## 2019-01-21 ENCOUNTER — Emergency Department (HOSPITAL_COMMUNITY)
Admission: EM | Admit: 2019-01-21 | Discharge: 2019-01-21 | Disposition: A | Discharge status: Home / Self Care | Payer: Managed Care, Other (non HMO) | Attending: Emergency Medicine | Admitting: Emergency Medicine

## 2019-01-21 ENCOUNTER — Other Ambulatory Visit: Payer: Self-pay

## 2019-01-21 ENCOUNTER — Ambulatory Visit
Admission: RE | Admit: 2019-01-21 | Discharge: 2019-01-21 | Disposition: A | Discharge status: Home / Self Care | Payer: Managed Care, Other (non HMO) | Source: Ambulatory Visit | Attending: Family Medicine | Admitting: Family Medicine

## 2019-01-21 ENCOUNTER — Other Ambulatory Visit: Payer: Self-pay | Admitting: Family Medicine

## 2019-01-21 DIAGNOSIS — R05 Cough: Secondary | ICD-10-CM | POA: Diagnosis not present

## 2019-01-21 DIAGNOSIS — J90 Pleural effusion, not elsewhere classified: Secondary | ICD-10-CM | POA: Diagnosis not present

## 2019-01-21 DIAGNOSIS — F17210 Nicotine dependence, cigarettes, uncomplicated: Secondary | ICD-10-CM | POA: Insufficient documentation

## 2019-01-21 DIAGNOSIS — Z79899 Other long term (current) drug therapy: Secondary | ICD-10-CM | POA: Insufficient documentation

## 2019-01-21 DIAGNOSIS — I10 Essential (primary) hypertension: Secondary | ICD-10-CM | POA: Diagnosis not present

## 2019-01-21 DIAGNOSIS — I252 Old myocardial infarction: Secondary | ICD-10-CM | POA: Diagnosis not present

## 2019-01-21 DIAGNOSIS — Z7982 Long term (current) use of aspirin: Secondary | ICD-10-CM | POA: Insufficient documentation

## 2019-01-21 DIAGNOSIS — R0602 Shortness of breath: Secondary | ICD-10-CM | POA: Insufficient documentation

## 2019-01-21 DIAGNOSIS — I2510 Atherosclerotic heart disease of native coronary artery without angina pectoris: Secondary | ICD-10-CM | POA: Diagnosis not present

## 2019-01-21 DIAGNOSIS — R053 Chronic cough: Secondary | ICD-10-CM

## 2019-01-21 DIAGNOSIS — E871 Hypo-osmolality and hyponatremia: Secondary | ICD-10-CM | POA: Diagnosis not present

## 2019-01-21 DIAGNOSIS — R0789 Other chest pain: Secondary | ICD-10-CM | POA: Diagnosis present

## 2019-01-21 LAB — CBC WITH DIFFERENTIAL/PLATELET
Abs Immature Granulocytes: 0.03 10*3/uL (ref 0.00–0.07)
Basophils Absolute: 0.1 10*3/uL (ref 0.0–0.1)
Basophils Relative: 1 %
Eosinophils Absolute: 0.3 10*3/uL (ref 0.0–0.5)
Eosinophils Relative: 3 %
HCT: 38.4 % — ABNORMAL LOW (ref 39.0–52.0)
Hemoglobin: 13.3 g/dL (ref 13.0–17.0)
Immature Granulocytes: 0 %
Lymphocytes Relative: 11 %
Lymphs Abs: 0.9 10*3/uL (ref 0.7–4.0)
MCH: 31.8 pg (ref 26.0–34.0)
MCHC: 34.6 g/dL (ref 30.0–36.0)
MCV: 91.9 fL (ref 80.0–100.0)
Monocytes Absolute: 0.9 10*3/uL (ref 0.1–1.0)
Monocytes Relative: 10 %
Neutro Abs: 6.5 10*3/uL (ref 1.7–7.7)
Neutrophils Relative %: 75 %
Platelets: 345 10*3/uL (ref 150–400)
RBC: 4.18 MIL/uL — ABNORMAL LOW (ref 4.22–5.81)
RDW: 11.4 % — ABNORMAL LOW (ref 11.5–15.5)
WBC: 8.6 10*3/uL (ref 4.0–10.5)
nRBC: 0 % (ref 0.0–0.2)

## 2019-01-21 LAB — BASIC METABOLIC PANEL
Anion gap: 10 (ref 5–15)
BUN: <5 mg/dL — ABNORMAL LOW (ref 6–20)
CO2: 28 mmol/L (ref 22–32)
Calcium: 8.8 mg/dL — ABNORMAL LOW (ref 8.9–10.3)
Chloride: 87 mmol/L — ABNORMAL LOW (ref 98–111)
Creatinine, Ser: 0.6 mg/dL — ABNORMAL LOW (ref 0.61–1.24)
GFR calc Af Amer: >60 mL/min (ref >60)
GFR calc non Af Amer: >60 mL/min (ref >60)
Glucose, Bld: 92 mg/dL (ref 70–99)
Potassium: 3.5 mmol/L (ref 3.5–5.1)
Sodium: 125 mmol/L — ABNORMAL LOW (ref 135–145)

## 2019-01-21 LAB — BRAIN NATRIURETIC PEPTIDE: B Natriuretic Peptide: 22.5 pg/mL (ref 0.0–100.0)

## 2019-01-21 MED ORDER — ALBUTEROL SULFATE HFA 108 (90 BASE) MCG/ACT IN AERS
2.0000 | INHALATION_SPRAY | Freq: Once | RESPIRATORY_TRACT | Status: AC
  Administered 2019-01-21: 2 via RESPIRATORY_TRACT
  Filled 2019-01-21: qty 6.7

## 2019-01-21 MED ORDER — OXYCODONE-ACETAMINOPHEN 5-325 MG PO TABS: 1.0000 | ORAL_TABLET | Freq: Four times a day (QID) | ORAL | 0 refills | Status: DC | PRN

## 2019-01-21 MED ORDER — ONDANSETRON HCL 4 MG/2ML IJ SOLN
4.0000 mg | INTRAMUSCULAR | Status: AC
  Administered 2019-01-21: 4 mg via INTRAVENOUS
  Filled 2019-01-21: qty 2

## 2019-01-21 MED ORDER — AEROCHAMBER PLUS FLO-VU LARGE MISC
Status: AC
  Administered 2019-01-21: 1
  Filled 2019-01-21: qty 1

## 2019-01-21 MED ORDER — AEROCHAMBER PLUS FLO-VU LARGE MISC
1.0000 | Freq: Once | Status: AC
  Administered 2019-01-21 (×2): 1

## 2019-01-21 MED ORDER — NICOTINE 21 MG/24HR TD PT24
21.0000 mg | MEDICATED_PATCH | Freq: Every day | TRANSDERMAL | Status: DC
  Administered 2019-01-21: 17:00:00 21 mg via TRANSDERMAL
  Filled 2019-01-21: qty 1

## 2019-01-21 MED ORDER — FENTANYL CITRATE (PF) 100 MCG/2ML IJ SOLN
50.0000 ug | Freq: Once | INTRAMUSCULAR | Status: AC
  Administered 2019-01-21: 50 ug via INTRAVENOUS
  Filled 2019-01-21: qty 2

## 2019-01-21 NOTE — ED Provider Notes (Signed)
[facility] EMERGENCY DEPARTMENT Provider Note   CSN: [id number] Arrival date & time: 01/21/19  1453    History   Chief Complaint Chief Complaint  Patient presents with  . Chest Pain  . Shortness of Breath    HPI Anthony Moon is a 53 y.o. male.  Who presents the emergency department for evaluation of cough and shortness of breath.  Patient states that he developed a cough 4 months ago.  He has been following up with his primary care physician repeatedly.  He was told initially that he had a viral process and was treated with a round of azithromycin.  He followed up about 3 weeks later with his primary care doctor and had another round of antibiotics.  The patient is not sure what that medication was.  The patient states that he has been back several times for the cough and has been on allergy medications, cough suppressants and nothing has worked.  Over the past several days he has had worsening shortness of breath.  He states that he feels extremely short of breath all the time.  He denies orthopnea or PND.  He does have a past medical history of coronary artery disease and MI he is status post 2 drug-eluting stents.  The patient does not take an ACE inhibitor.  He denies reflux symptoms.  Had an outpatient x-ray today and was told that he had pneumonia and "fluid on his lung."  He was told that he needed to come here to have it drained.  He denies soaking night sweats, unexplained weight loss or fevers.  Patient is a daily smoker however he states that he is trying to quit and has not smoked a cigarette in the past 3 days.  He is currently using nicotine patches.     HPI  Past Medical History:  Diagnosis Date  . Bilateral recurrent inguinal hernia   . CAD (coronary artery disease) cardiologist-  dr Angelena Form   NSTEMI--- s/p cath 05/18/2010 DES  x2  to mLAD and pLAD not overlapping  . GERD (gastroesophageal reflux disease)   . History of non-ST elevation myocardial  infarction (NSTEMI)    05-18-2010  s/p  PCI and DES x2 to LAD  . History of pericarditis    12-23-2010  resolved  . Hypertension   . Nonischemic cardiomyopathy ([place])    last ef 40-45% per echo 2011  . Rash    mid chest  . S/P drug eluting coronary stent placement    05-18-2010  to mLAD and pLAD  . Seasonal allergies   . Smokers' cough [facility])     Patient Active Problem List   Diagnosis Date Noted  . Chest pain, rule out acute myocardial infarction 12/22/2016  . Chest pain, atypical 12/22/2016  . Hyperlipidemia LDL goal <70   . Adjustment disorder with anxiety 12/09/2012  . TOBACCO ABUSE 05/31/2010  . DRUG ABUSE 05/31/2010  . HYPERTENSION, BORDERLINE 05/31/2010  . MYOCARDIAL INFARCTION, ACUTE, SUBENDOCARDIAL 05/31/2010  . CAD, NATIVE VESSEL 05/31/2010  . CARDIOMYOPATHY, ISCHEMIC 05/31/2010    Past Surgical History:  Procedure Laterality Date  . CORONARY ANGIOPLASTY WITH STENT PLACEMENT  05-18-2010   dr Angelena Form   PCI and DES x2 to mLAD and pLAD (not overlapping)/  hypokinesis of anterolateral wall and apex w/ ef 50%/  mild nonobstuctive cad in RCA and CFX  . INGUINAL HERNIA REPAIR Bilateral age 30  . INGUINAL HERNIA REPAIR Bilateral 12/21/2015   Procedure: LAPAROSCOPIC BILATERAL INGUINAL HERNIA REPAIR W/MESH ;  Surgeon: Arta Bruce Kinsinger, MD;  Location[facility];  Service: General;  Laterality: Bilateral;  . MANDIBLE FRACTURE SURGERY Left 1990's  . TRANSTHORACIC ECHOCARDIOGRAM  05-18-2010   septal, apical, and inferoapical hypokinesis,  ef 40-45%/  trivial MR/  mild TR        Home Medications    Prior to Admission medications   Medication Sig Start Date End Date Taking? Authorizing Provider  aspirin 81 MG tablet Take 1 tablet (81 mg total) by mouth daily. 01/16/11   Burnell Blanks, MD  clopidogrel (PLAVIX) 75 MG tablet Take 1 tablet (75 mg total) by mouth daily. 12/09/18   Burnell Blanks, MD  hydrochlorothiazide (HYDRODIURIL) 25 MG  tablet Take 1 tablet (25 mg total) by mouth daily. 12/09/18   Burnell Blanks, MD  losartan (COZAAR) 100 MG tablet Take 1 tablet (100 mg total) by mouth daily. 12/09/18   Burnell Blanks, MD  metoprolol tartrate (LOPRESSOR) 50 MG tablet Take 1 tablet (50 mg total) by mouth 2 (two) times daily. 12/09/18   Burnell Blanks, MD  nitroGLYCERIN (NITROSTAT) 0.4 MG SL tablet Place 1 tablet (0.4 mg total) under the tongue every 5 (five) minutes as needed. 12/09/18   Burnell Blanks, MD  omeprazole (PRILOSEC) 40 MG capsule Take 40 mg by mouth daily.    [provider]  simvastatin (ZOCOR) 40 MG tablet Take 1 tablet (40 mg total) by mouth at bedtime. 12/09/18   Burnell Blanks, MD  sucralfate (CARAFATE) 1 GM/10ML suspension Take 10 mLs (1 g total) by mouth 4 (four) times daily -  with meals and at bedtime. 12/23/16   Reyne Dumas, MD  thiamine 100 MG tablet Take 1 tablet (100 mg total) by mouth daily. 12/24/16   Reyne Dumas, MD    Family History Family History  Problem Relation Age of Onset  . Coronary artery disease Father     Social History Social History   Tobacco Use  . Smoking status: Current Every Day Smoker    Packs/day: 0.50    Years: 40.00    Pack years: 20.00    Types: Cigarettes  . Smokeless tobacco: Never Used  Substance Use Topics  . Alcohol use: Yes    Alcohol/week: 14.0 standard drinks    Types: 14 Cans of beer per week    Comment: 6 beers  . Drug use: No    Types: Marijuana    Comment: per pt quit long time ago     Allergies   Patient has no known allergies.   Review of Systems Review of Systems Ten systems reviewed and are negative for acute change, except as noted in the HPI.    Physical Exam Updated Vital Signs BP 121/82 (BP Location: Right Arm)   Pulse 88   Temp 98.1 F (36.7 C) (Oral)   Resp 20   Ht 5\' 11"  (1.803 m)   Wt 76.2 kg   BMI 23.43 kg/m   Physical Exam Vitals signs and nursing note reviewed.   Constitutional:      General: He is not in acute distress.    Appearance: He is well-developed. He is not diaphoretic.  HENT:     Head: Normocephalic and atraumatic.  Eyes:     General: No scleral icterus.    Conjunctiva/sclera: Conjunctivae normal.  Neck:     Musculoskeletal: Normal range of motion and neck supple.  Cardiovascular:     Rate and Rhythm: Normal rate and regular rhythm.  Heart sounds: Normal heart sounds.  Pulmonary:     Effort: Pulmonary effort is normal. No respiratory distress.     Breath sounds: Wheezing and rhonchi present.  Abdominal:     Palpations: Abdomen is soft.     Tenderness: There is no abdominal tenderness.  Skin:    General: Skin is warm and dry.  Neurological:     Mental Status: He is alert.  Psychiatric:        Behavior: Behavior normal.      ED Treatments / Results  Labs (all labs ordered are listed, but only abnormal results are displayed) Labs Reviewed - No data to display  EKG EKG Interpretation  Date/Time:  Tuesday January 21 2019 15:16:40 EDT Ventricular Rate:  89 PR Interval:  172 QRS Duration: 100 QT Interval:  364 QTC Calculation: 442 R Axis:   78 Text Interpretation:  Normal sinus rhythm Septal infarct , age undetermined Abnormal ECG Confirmed by Lennice Sites [phone]) on 01/21/2019 5:58:02 PM   Radiology Dg Chest 2 View  Result Date: 01/21/2019 CLINICAL DATA:  Cough and right-sided chest pain for several months EXAM: CHEST - 2 VIEW COMPARISON:  06/04/2018 FINDINGS: Cardiac shadows within normal limits. Left lung is hyperinflated with stable chronic changes along the left base similar to that seen on the prior exam. This projects in the lingula on the lateral projection. Moderate right-sided pleural effusion is noted with right basilar volume loss consistent with acute infiltrate. No bony abnormality is noted. IMPRESSION: Moderate right pleural effusion with basilar volume loss consistent with pneumonia. Chronic appearing  changes in the left base. Electronically Signed   By: Inez Catalina M.D.   On: 01/21/2019 10:14    Procedures Procedures (including critical care time)  Medications Ordered in ED Medications - No data to display   Initial Impression / Assessment and Plan / ED Course  I have reviewed the triage vital signs and the nursing notes.  Pertinent labs & imaging results that were available during my care of the patient were reviewed by me and considered in my medical decision making (see chart for details).  Clinical Course as of Jan 20 1909  Tue Jan 21, 2019  1754 Patient sodium is markedly low. Patien is on a thiazide.  Sodium(!): 125 [AH]    Clinical Course User Index [AH] Margarita Mail, PA-C        CC:sob VS: BP 122/69   Pulse 93   Temp 98.1 F (36.7 C) (Oral)   Resp 14   Ht 5\' 11"  (1.803 m)   Wt 76.2 kg   SpO2 95%   BMI 23.43 kg/m  EQ:ASTMHDQ is gathered by the patient  and review of EMR. DDX:The emergent differential diagnosis for shortness of breath includes, but is not limited to, Pulmonary edema, bronchoconstriction, Pneumonia, Pulmonary embolism, Pneumotherax/ Hemothorax, Dysrythmia, ACS.  Labs: I reviewed the labs which show Hyponatremia Imaging: I personally reviewed the images (PA/LAT CXR) which show(s)  Large r sided pleural effusion EKG:NSR unchanged from previous QIW:LNLGXQJ here with CP, now c/o sharp, pleuritic component.  His pain is improved significantly after pain medication.  Given the patient is a daily smoker there is high risk for underlying malignancy.  Initially I did try to admit the patient however given his stable vital signs and lack of hypoxia he was denied admission.  This is also probably the optimal outcome given the active coronavirus [place].  I was able to contact the physician for the patient's practice to send a direct  message to the patient primary care doctor to either schedule or follow-up thoracentesis.  I was able to finally get in  contact with interventional radiology who we discussed the case with was able to place an order for outpatient thoracentesis which should take place tomorrow given the patient significant relief.  His primary care doctor should be aware for follow-up.  The patient is stable.  I have reviewed the PDMP system and he has no active narcotics.  I have given the patient a prescription for 8 Percocet tablets for pain control.  Discussed return precautions. Patient disposition: Discharge Patient condition: Stable. The patient appears reasonably stabilized for admission considering the current resources, flow, and capabilities available in the ED at this time, and I doubt any other [facility] requiring further screening and/or treatment in the ED prior to admission. T have the patient heart ongoing  Final Clinical Impressions(s) / ED Diagnoses   Final diagnoses:  None    ED Discharge Orders    None       Margarita Mail, PA-C 01/21/19 2341    Lennice Sites, DO 01/22/19 1458

## 2019-01-21 NOTE — ED Notes (Signed)
Checked pulse ox while assisting pt to ambulate in hallway. Pre-test 99% on RA, HR 80 bpm; during ambulation, 94% on RA, 94 bpm; post test 98%, 86bpm. Pt reports ShOB throughout ambulation and "stabbing in chest" that is constant (not made worse by the walking, just makes walking feel more difficult than normal).

## 2019-01-21 NOTE — ED Notes (Addendum)
ED Provider at bedside. 

## 2019-01-21 NOTE — ED Triage Notes (Signed)
Pt sent from primary care provider to have fluid from his lungs drained.  States 3 month hx of progressive cough and worsening SOB, with CP.   Has had CXR today and 4 months ago. At that time was diagnosed with viral syndrome.

## 2019-01-21 NOTE — ED Notes (Signed)
Pt alert and oriented, comfortable in bed talking on the phone

## 2019-01-21 NOTE — ED Notes (Signed)
Pt requested to speak to ED provider

## 2019-01-21 NOTE — Discharge Instructions (Signed)
Contact a health care provider if: The amount of time that you are able to do mild exercise: Decreases. Does not improve with time. You have a fever. Get help right away if: You are very short of breath. You develop severe, or uncontrolled chest pain. You develop a new cough.

## 2019-01-22 ENCOUNTER — Other Ambulatory Visit: Payer: Self-pay | Admitting: Family Medicine

## 2019-01-22 ENCOUNTER — Ambulatory Visit (HOSPITAL_COMMUNITY)
Admission: RE | Admit: 2019-01-22 | Discharge: 2019-01-22 | Disposition: A | Discharge status: Home / Self Care | Payer: Managed Care, Other (non HMO) | Source: Ambulatory Visit | Attending: Family Medicine | Admitting: Family Medicine

## 2019-01-22 ENCOUNTER — Encounter (HOSPITAL_COMMUNITY): Payer: Self-pay | Admitting: Interventional Radiology

## 2019-01-22 ENCOUNTER — Ambulatory Visit: Payer: Managed Care, Other (non HMO) | Admitting: Internal Medicine

## 2019-01-22 VITALS — BP 90/70 | HR 98 | Temp 98.9°F | Ht 71.0 in | Wt 159.4 lb

## 2019-01-22 DIAGNOSIS — F17210 Nicotine dependence, cigarettes, uncomplicated: Secondary | ICD-10-CM | POA: Diagnosis not present

## 2019-01-22 DIAGNOSIS — J90 Pleural effusion, not elsewhere classified: Secondary | ICD-10-CM | POA: Diagnosis not present

## 2019-01-22 DIAGNOSIS — I10 Essential (primary) hypertension: Secondary | ICD-10-CM

## 2019-01-22 DIAGNOSIS — Z9889 Other specified postprocedural states: Secondary | ICD-10-CM

## 2019-01-22 DIAGNOSIS — E871 Hypo-osmolality and hyponatremia: Secondary | ICD-10-CM | POA: Diagnosis not present

## 2019-01-22 HISTORY — PX: IR THORACENTESIS ASP PLEURAL SPACE W/IMG GUIDE: IMG5380

## 2019-01-22 LAB — BODY FLUID CELL COUNT WITH DIFFERENTIAL
Eos, Fluid: 2 %
Lymphs, Fluid: 22 %
Monocyte-Macrophage-Serous Fluid: 60 % (ref 50–90)
Neutrophil Count, Fluid: 16 % (ref 0–25)
Total Nucleated Cell Count, Fluid: 975 cu mm (ref 0–1000)

## 2019-01-22 MED ORDER — LIDOCAINE HCL 1 % IJ SOLN
INTRAMUSCULAR | Status: AC
  Filled 2019-01-22: qty 20

## 2019-01-22 MED ORDER — OXYCODONE HCL 5 MG PO TABS: ORAL_TABLET | ORAL | 0 refills | Status: DC

## 2019-01-22 MED ORDER — HYDROCODONE-ACETAMINOPHEN 5-325 MG PO TABS: 1.0000 | ORAL_TABLET | ORAL | Status: DC | PRN

## 2019-01-22 MED ORDER — LIDOCAINE HCL (PF) 1 % IJ SOLN
INTRAMUSCULAR | Status: AC | PRN
  Administered 2019-01-22: 10 mL

## 2019-01-22 NOTE — Progress Notes (Signed)
Anthony Moon, male    DOB: 10-15-1966,     MRN: [id number]   Brief patient profile:  13 yowm active smoker grew up in [place] but lived all over with new cough/ sob around [place] of 2019  And found to have R pl effusion 01/21/2019 > IR thoracentesis x one liter so referred to pulmonary clinic 01/22/2019 by Dr   Maurice Small     History of Present Illness  01/22/2019  Pulmonary/ 1st office eval/[facility]  Chief Complaint  Patient presents with  . Pulmonary Consult    Referred by Dr. Maurice Small. Pt c/o cough and SOB x 4 months. He states he gets SOB with or without any exertion but esp worse with exertion. Cough is non prod. He had thoracentesis performed today and this helped minimally.   Dyspnea:  Has been sleeping R side down with one pillow s leg swelling  .MMRC2 = can't walk a nl pace on a flat grade s sob but does fine slow and flat  Cough: mostly dry/ some clear mucus hurts to cough equal front and back, better since tap  Sleep: mostly bothered by pain SABA use: none  Molar removed on  R lower ? Late feb / early March 2020   No obvious day to day or daytime variability or assoc  purulent sputum or mucus plugs or hemoptysis  , subjective wheeze or overt sinus or hb symptoms.    . Also denies any obvious fluctuation of symptoms with weather or environmental changes or other aggravating or alleviating factors except as outlined above   No unusual exposure hx or h/o childhood pna/ asthma or knowledge of premature birth.  Current Allergies, Complete Past Medical History, Past Surgical History, Family History, and Social History were reviewed in [employer] record.  ROS  The following are not active complaints unless bolded Hoarseness, sore throat, dysphagia, dental problems, itching, sneezing,  nasal congestion or discharge of excess mucus or purulent secretions, ear ache,   fever, chills, sweats, unintended wt loss or wt gain, classically  exertional cp,  orthopnea pnd or arm/hand  swelling  or leg swelling, presyncope, palpitations, abdominal pain, anorexia, nausea, vomiting, diarrhea  or change in bowel habits or change in bladder habits, change in stools or change in urine, dysuria, hematuria,  rash, arthralgias, visual complaints, headache, numbness, weakness or ataxia or problems with walking or coordination,  change in mood or  memory.           Past Medical History:  Diagnosis Date  . Bilateral recurrent inguinal hernia   . CAD (coronary artery disease) cardiologist-  dr Angelena Form   NSTEMI--- s/p cath 05/18/2010 DES  x2  to mLAD and pLAD not overlapping  . GERD (gastroesophageal reflux disease)   . History of non-ST elevation myocardial infarction (NSTEMI)    05-18-2010  s/p  PCI and DES x2 to LAD  . History of pericarditis    12-23-2010  resolved  . Hypertension   . Nonischemic cardiomyopathy ([place])    last ef 40-45% per echo 2011  . Rash    mid chest  . S/P drug eluting coronary stent placement    05-18-2010  to mLAD and pLAD  . Seasonal allergies   . Smokers' cough [facility])     Outpatient Medications Prior to Visit  Medication Sig Dispense Refill  . aspirin 81 MG tablet Take 1 tablet (81 mg total) by mouth daily. 30 tablet   . clopidogrel (PLAVIX) 75 MG tablet Take  1 tablet (75 mg total) by mouth daily. 90 tablet 3  . hydrochlorothiazide (HYDRODIURIL) 25 MG tablet Take 1 tablet (25 mg total) by mouth daily. 90 tablet 3  . losartan (COZAAR) 100 MG tablet Take 1 tablet (100 mg total) by mouth daily. 90 tablet 3  . metoprolol tartrate (LOPRESSOR) 50 MG tablet Take 1 tablet (50 mg total) by mouth 2 (two) times daily. 180 tablet 3  . nitroGLYCERIN (NITROSTAT) 0.4 MG SL tablet Place 1 tablet (0.4 mg total) under the tongue every 5 (five) minutes as needed. 25 tablet 6  . omeprazole (PRILOSEC) 40 MG capsule Take 40 mg by mouth daily.    Marland Kitchen oxyCODONE-acetaminophen (PERCOCET) 5-325 MG tablet Take 1 tablet by mouth every 6 (six) hours as needed for severe pain. 8  tablet 0  . simvastatin (ZOCOR) 40 MG tablet Take 1 tablet (40 mg total) by mouth at bedtime. (Patient taking differently: Take 40 mg by mouth daily. ) 90 tablet 3      Objective:     BP 90/70 (BP Location: Left Arm, Cuff Size: Normal)   Pulse 98   Temp 98.9 F (37.2 C) (Oral)   Ht 5\' 11"  (1.803 m)   Wt 159 lb 6.4 oz (72.3 kg)   SpO2 94%   BMI 22.23 kg/m   SpO2: 94 %  RA   Thin chronically ill appearing wm    HEENT: Poor dentition/  nl oropharynx. Nl external ear canals without cough reflex -  Mild bilateral non-specific turbinate edema     NECK :  without JVD/Nodes/TM/ nl carotid upstrokes bilaterally   LUNGS: no acc muscle use,  Mild barrel  contour chest wall with bilateral  Distant bs s audible wheeze much worse in RLL/dullness to percussion.     CV:  RRR  no s3 or murmur or increase in P2, and no edema   ABD:  soft and nontender with pos mid insp Hoover's  in the supine position. No bruits or organomegaly appreciated, bowel sounds nl  MS:   Nl gait/  ext warm without deformities, calf tenderness, cyanosis ? Mild  clubbing No obvious joint restrictions   SKIN: warm and dry without lesions    NEURO:  alert, approp, nl sensorium with  no motor or cerebellar deficits apparent.           I personally reviewed images and agree with radiology impression as follows:  PA  CXR:   01/22/2019  No pneumothorax post thoracentesis. Slight improvement in moderately large right pleural effusion with persistent consolidation/atelectasis particularly at the lung base. Left lung remains clear. My impression: note trachea shifted to R                  I personally reviewed images and agree with radiology impression as follows:  CXR:   01/21/2019  Moderate right pleural effusion with basilar volume loss consistent with pneumonia.  Chronic appearing changes in the left base.    Labs ordered/ reviewed:      Chemistry      Component Value Date/Time   NA  125 (L) 01/21/2019 1700   K 3.5 01/21/2019 1700   CL 87 (L) 01/21/2019 1700   CO2 28 01/21/2019 1700   BUN <5 (L) 01/21/2019 1700   CREATININE 0.60 (L) 01/21/2019 1700      Component Value Date/Time   CALCIUM 8.8 (L) 01/21/2019 1700   ALKPHOS 56 12/22/2016 0441   AST 27 12/22/2016 0441   ALT 29 12/22/2016 0441  BILITOT 0.6 12/22/2016 0441      BNP                             22.5              01/21/19    Lab Results  Component Value Date   WBC 8.6 01/21/2019   HGB 13.3 01/21/2019   HCT 38.4 (L) 01/21/2019   MCV 91.9 01/21/2019   PLT 345 01/21/2019       EOS                                                               0.3                                   01/22/2019        Assessment   Pleural effusion on right Onset of symptoms xmas 2019 - R Thoracentesis x one liter 01/22/2019  X 1 liter with wbc 975  Mono's > Lymphs > polys - significant atx on cxr 01/22/2019 post tap   This is almost certainly bronchogenic ca statistically though the h/o poor dentition and molar surgery is also suggestive of empyema,  The hyponatremia and atx and fluid content point to malignancy as does the persistent cp.   rec tap dry 01/27/19 then follow up with CT chest   Discussed in detail all the  indications, usual  risks and alternatives  relative to the benefits with patient who agrees to proceed with w/u as outlined.        Cigarette smoker Counseled re importance of smoking cessation but did not meet time criteria for separate billing        Hyponatremia with low BP p tcentesis 01/22/2019  D/c hydrodiuril 01/22/2019   Concerning for siadh but first step is hold hctz and do water restriction then repeat in a few weeks   Total time devoted to counseling  > 50 % of initial 60 min office visit:  review case with pt/ discussion of options/alternatives/ personally creating written customized instructions  in presence of pt  then going over those specific  Instructions directly with the pt  including how to use all of the meds but in particular covering each new medication in detail and the difference between the maintenance= "automatic" meds and the prns using an action plan format for the latter (If this problem/symptom => do that organization reading Left to right).  Please see AVS from this visit for a full list of these instructions which I personally wrote for this pt and  are unique to this visit.      Christinia Gully, MD 01/22/2019

## 2019-01-22 NOTE — Procedures (Signed)
  Procedure: [place] R thoracentesis   EBL:   minimal Complications:  none immediate  See full dictation in [facility].  Dillard Cannon MD Main # [phone] Pager  [phone]

## 2019-01-22 NOTE — Patient Instructions (Addendum)
Stop hydrodiuril and minimize fluid intake   Take oxycodone 5 mg 1-2 every 4 hours to control the pain, not eliminate it, and take something for constipation like MOM or miralax    I will arrange a follow thoracentesis then CT chest same day at [facility]

## 2019-01-23 ENCOUNTER — Encounter: Payer: Self-pay | Admitting: Internal Medicine

## 2019-01-23 DIAGNOSIS — J910 Malignant pleural effusion: Secondary | ICD-10-CM | POA: Insufficient documentation

## 2019-01-23 DIAGNOSIS — E871 Hypo-osmolality and hyponatremia: Secondary | ICD-10-CM | POA: Insufficient documentation

## 2019-01-23 LAB — PATHOLOGIST SMEAR REVIEW

## 2019-01-23 NOTE — Assessment & Plan Note (Signed)
D/c hydrodiuril 01/22/2019   Concerning for siadh but first step is hold hctz and do water restriction then repeat in a few weeks   Total time devoted to counseling  > 50 % of initial 60 min office visit:  review case with pt/ discussion of options/alternatives/ personally creating written customized instructions  in presence of pt  then going over those specific  Instructions directly with the pt including how to use all of the meds but in particular covering each new medication in detail and the difference between the maintenance= "automatic" meds and the prns using an action plan format for the latter (If this problem/symptom => do that organization reading Left to right).  Please see AVS from this visit for a full list of these instructions which I personally wrote for this pt and  are unique to this visit.

## 2019-01-23 NOTE — Assessment & Plan Note (Signed)
Onset of symptoms xmas 2019 - R Thoracentesis x one liter 01/22/2019  X 1 liter with wbc 975  Mono's > Lymphs > polys - significant atx on cxr 01/22/2019 post tap   This is almost certainly bronchogenic ca statistically though the h/o poor dentition and molar surgery is also suggestive of empyema,  The hyponatremia and atx and fluid content point to malignancy as does the persistent cp.   rec tap dry 01/27/19 then follow up with CT chest   Discussed in detail all the  indications, usual  risks and alternatives  relative to the benefits with patient who agrees to proceed with w/u as outlined.

## 2019-01-23 NOTE — Assessment & Plan Note (Addendum)
Counseled re importance of smoking cessation but did not meet time criteria for separate billing   °

## 2019-01-23 NOTE — Assessment & Plan Note (Signed)
D/c hctz 01/22/2019 due to low bp and na

## 2019-01-27 ENCOUNTER — Other Ambulatory Visit: Payer: Self-pay | Admitting: Internal Medicine

## 2019-01-27 ENCOUNTER — Telehealth: Payer: Self-pay | Admitting: Internal Medicine

## 2019-01-27 DIAGNOSIS — J90 Pleural effusion, not elsewhere classified: Secondary | ICD-10-CM

## 2019-01-27 NOTE — Telephone Encounter (Signed)
Pt is scheduled for IR thoracentesis@[facility]  01/29/19@9AM  once this is completed he will go to [facility] for chest ct@11am  pt is aware of this Joellen [place]

## 2019-01-27 NOTE — Telephone Encounter (Signed)
Primary Pulmonologist: Dr. Melvyn Novas Last office visit and with whom: 01/22/2019 What do we see them for (pulmonary problems): Pleural Effusion  Reason for call: Called pt now that phones are back up. Pt states he has a "stabbing" pain in his right lung, persistent cough w/ white mucus for about 4 months. Denies fever/chills/sweats. States pain is now in the lower right side near his ribs. Was in ED for pleural effusion 01/21/2019 and underwent thoracentesis 01/22/2019 in office performed by MW. MW prescribed oxycodone 5 mg 1-2 every 4 hours to control pain. Pt is taking this medication as directed, but states "right at the 4-hour mark, the pain comes right back." Pt is concerned he needs to go to the ED.   In the last month, have you been in contact with someone who was confirmed or suspected to have Conoravirus / COVID-19?  No Have you traveled internationally or to an area with more than 100 reported cases of Coronavirus / COVID-19?   No  Do you have any of the following symptoms developed in the last 30 days? Fever: No Cough: Yes Shortness of breath: No  When did your symptoms start?  4 months ago  If the patient has a fever, what is the last reading?  (use n/a if patient denies fever)  N/A . IF THE PATIENT STATES THEY DO NOT OWN A THERMOMETER, THEY MUST GO AND PURCHASE ONE When did the fever start?: N/A Have you taken any medication to suppress a fever (ie Ibuprofen, Aleve, Tylenol)?: N/A   MW please advise.
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
## 2019-01-27 NOTE — Telephone Encounter (Signed)
Discussed in detail all the  indications, usual  risks and alternatives  relative to the benefits with patient who agrees to proceed with w/u with repeat tap for studies and then CT chest same day

## 2019-01-29 ENCOUNTER — Encounter (HOSPITAL_COMMUNITY): Payer: Self-pay | Admitting: Student

## 2019-01-29 ENCOUNTER — Ambulatory Visit (HOSPITAL_COMMUNITY)
Admission: RE | Admit: 2019-01-29 | Discharge: 2019-01-29 | Disposition: A | Discharge status: Home / Self Care | Payer: Managed Care, Other (non HMO) | Source: Ambulatory Visit | Attending: Internal Medicine | Admitting: Internal Medicine

## 2019-01-29 ENCOUNTER — Inpatient Hospital Stay: Admission: RE | Admit: 2019-01-29 | Payer: Managed Care, Other (non HMO) | Source: Ambulatory Visit

## 2019-01-29 ENCOUNTER — Other Ambulatory Visit: Payer: Self-pay

## 2019-01-29 DIAGNOSIS — R222 Localized swelling, mass and lump, trunk: Secondary | ICD-10-CM | POA: Insufficient documentation

## 2019-01-29 DIAGNOSIS — J90 Pleural effusion, not elsewhere classified: Secondary | ICD-10-CM | POA: Diagnosis not present

## 2019-01-29 HISTORY — PX: IR THORACENTESIS ASP PLEURAL SPACE W/IMG GUIDE: IMG5380

## 2019-01-29 LAB — PROTEIN, PLEURAL OR PERITONEAL FLUID: Total protein, fluid: 3.7 g/dL

## 2019-01-29 LAB — GLUCOSE, PLEURAL OR PERITONEAL FLUID: Glucose, Fluid: 51 mg/dL

## 2019-01-29 LAB — LACTATE DEHYDROGENASE, PLEURAL OR PERITONEAL FLUID: LD, Fluid: 526 U/L — ABNORMAL HIGH (ref 3–23)

## 2019-01-29 MED ORDER — LIDOCAINE HCL 1 % IJ SOLN
INTRAMUSCULAR | Status: AC
  Filled 2019-01-29: qty 20

## 2019-01-29 MED ORDER — IOPAMIDOL (ISOVUE-300) INJECTION 61%
75.0000 mL | Freq: Once | INTRAVENOUS | Status: AC | PRN
  Administered 2019-01-29: 75 mL via INTRAVENOUS

## 2019-01-29 MED ORDER — LIDOCAINE HCL 1 % IJ SOLN
INTRAMUSCULAR | Status: AC | PRN
  Administered 2019-01-29: 10 mL

## 2019-01-29 NOTE — Procedures (Signed)
PROCEDURE SUMMARY:  Successful US guided right diagnostic and therapeutic thoracentesis. Yielded 1.3 liters of loculated, amber fluid. Pt tolerated procedure well. No immediate complications.  Specimen was sent for labs. Sent for CT as ordered.  EBL < 5 mL  Docia Barrier PA-C 01/29/2019 10:06 AM

## 2019-01-30 NOTE — Telephone Encounter (Signed)
I spoke to pt by phone re using roxicodone for pain until we have his cytology back to help direct the next step in the w/u so nothing further needed here

## 2019-01-30 NOTE — Telephone Encounter (Signed)
Primary Pulmonologist: Dr. Christinia Gully Last office visit and with whom: 01/22/2019 w/ MW  What do we see them for (pulmonary problems): Pleural Effusion  Reason for call: Called and spoke w/ pt today regarding thoracentesis performed yesterday 01/29/2019. Observed pt to be very SOB over the phone. Pt states he is still SOB despite having thoracentesis yesterday. States has cough every 30 minutes w/ white mucus and pain. Denies fever/chills/sweats. Would like to hear from [facility] regarding recommendations and the results of the thoracentesis.   In the last month, have you been in contact with someone who was confirmed or suspected to have Conoravirus / COVID-19?  No  Do you have any of the following symptoms developed in the last 30 days? Fever: No Cough: Yes Shortness of breath: Yes  When did your symptoms start?  Since yesterday 01/29/2019  If the patient has a fever, what is the last reading?  (use n/a if patient denies fever)  N/a . IF THE PATIENT STATES THEY DO NOT OWN A THERMOMETER, THEY MUST GO AND PURCHASE ONE When did the fever start?: N/A Have you taken any medication to suppress a fever (ie Ibuprofen, Aleve, Tylenol)?: N/A  Dr. Melvyn Novas, please advise on the results of this patient's thoracentesis and on recommendations for his persistent SOB. Thank you.

## 2019-01-31 ENCOUNTER — Telehealth: Payer: Self-pay | Admitting: Internal Medicine

## 2019-01-31 MED ORDER — OXYCODONE HCL 5 MG PO TABS: ORAL_TABLET | ORAL | 0 refills | Status: DC

## 2019-01-31 NOTE — Telephone Encounter (Signed)
Called spoke with patient who reports that when speaking personally with MW on yesterday 4.16.2020 patient was instructed to increase his oxycodone to 2 tabs every 4 hours.  Patient stated he has now run out of this medication.  Patient's last dose of the oxycodone 5mg  was at 0400 and did not have any more for his 0900 dose.  Patient stated that he feels like "a knife is getting ready to rip through his chest."  Patient is requesting a refill be sent to [facility] on [address]. Patient is aware that MW is rounding at the hospital this morning and will send Rx as available.  Patient voiced his understanding.  Dr Melvyn Novas please advise on refill >> will go ahead and pend Rx for signature.

## 2019-01-31 NOTE — Telephone Encounter (Signed)
I  Told him it was ok to take up to 3 every 4 hours if needed which = 18 per 24 h which should last 90/18 = 5 days and we'll regroup then by which time we should be hearing all  the results of his testing

## 2019-01-31 NOTE — Telephone Encounter (Signed)
Called and spoke with pt and stated to him the info from MW. I also stated to pt that MW did send a refill of oxycodone to pharmacy for him. Pt expressed understanding. Nothing further needed.

## 2019-02-03 ENCOUNTER — Telehealth: Payer: Self-pay | Admitting: Internal Medicine

## 2019-02-03 NOTE — Telephone Encounter (Signed)
Discussed pos cytology with pt and ct findings - need to know whether RMSB is obstructed with endobronchial dz   Discussed in detail all the  indications, usual  risks and alternatives  relative to the benefits with patient who agrees to proceed with bronchoscopy with biopsy at [facility] on 02/05/2019

## 2019-02-03 NOTE — Telephone Encounter (Signed)
Called and spoke with pt in regards to the fluid on his right lung and stated that he has had two thoracentesis performed by MW. Pt stated he had fluid drawn the second time he had the thoracentesis performed was last Wednesday 4/15.  Pt stated he had a CT performed which he stated he was told showed that there was a blockage that was keeping his lung from being able to re-inflate.  Due to this, pt is wanting to know if all the test results have come back that MW was waiting on to figure out what could be done to help him with all of this. Pt stated he is worn out and is tired of dealing with feeling like he is struggling to breathe and wants to know if we have any answers for him.   Pt stated the last time he smoked was 3 days ago and stated he is now wearing a patch to help keep him from smoking.  Dr. Melvyn Novas, please advise on this for pt. Thanks!

## 2019-02-04 NOTE — H&P (Signed)
Anthony Moon, male    DOB: 03-Mar-1966,     MRN: [id number]   Brief patient profile:  73 yowm active smoker grew up in [place] but lived all over with new cough/ sob around [place] of 2019  And found to have R pl effusion 01/21/2019 > IR thoracentesis x one liter so referred to pulmonary clinic 01/22/2019 by Dr   Maurice Small     History of Present Illness  01/22/2019  Pulmonary/ 1st office eval/[facility] [place]      Chief Complaint  Patient presents with  . Pulmonary Consult    Referred by Dr. Maurice Small. Pt c/o cough and SOB x 4 months. He states he gets SOB with or without any exertion but esp worse with exertion. Cough is non prod. He had thoracentesis performed today and this helped minimally.   Dyspnea:  Has been sleeping R side down with one pillow s leg swelling  .MMRC2 = can't walk a nl pace on a flat grade s sob but does fine slow and flat  Cough: mostly dry/ some clear mucus hurts to cough equal front and back, better since tap  Sleep: mostly bothered by pain SABA use: none  Molar removed on  R lower ? Late feb / early March 2020   No obvious day to day or daytime variability or assoc  purulent sputum or mucus plugs or hemoptysis  , subjective wheeze or overt sinus or hb symptoms.    . Also denies any obvious fluctuation of symptoms with weather or environmental changes or other aggravating or alleviating factors except as outlined above   No unusual exposure hx or h/o childhood pna/ asthma or knowledge of premature birth.  Current Allergies, Complete Past Medical History, Past Surgical History, Family History, and Social History were reviewed in [employer] record.  ROS  The following are not active complaints unless bolded Hoarseness, sore throat, dysphagia, dental problems, itching, sneezing,  nasal congestion or discharge of excess mucus or purulent secretions, ear ache,   fever, chills, sweats, unintended wt loss or wt gain, classically  exertional cp,   orthopnea pnd or arm/hand swelling  or leg swelling, presyncope, palpitations, abdominal pain, anorexia, nausea, vomiting, diarrhea  or change in bowel habits or change in bladder habits, change in stools or change in urine, dysuria, hematuria,  rash, arthralgias, visual complaints, headache, numbness, weakness or ataxia or problems with walking or coordination,  change in mood or  memory.               Past Medical History:  Diagnosis Date  . Bilateral recurrent inguinal hernia   . CAD (coronary artery disease) cardiologist-  dr Angelena Form   NSTEMI--- s/p cath 05/18/2010 DES  x2  to mLAD and pLAD not overlapping  . GERD (gastroesophageal reflux disease)   . History of non-ST elevation myocardial infarction (NSTEMI)    05-18-2010  s/p  PCI and DES x2 to LAD  . History of pericarditis    12-23-2010  resolved  . Hypertension   . Nonischemic cardiomyopathy ([place])    last ef 40-45% per echo 2011  . Rash    mid chest  . S/P drug eluting coronary stent placement    05-18-2010  to mLAD and pLAD  . Seasonal allergies   . Smokers' cough [facility])           Outpatient Medications Prior to Visit  Medication Sig Dispense Refill  . aspirin 81 MG tablet Take 1 tablet (81 mg total)  by mouth daily. 30 tablet   . clopidogrel (PLAVIX) 75 MG tablet Take 1 tablet (75 mg total) by mouth daily. 90 tablet 3  . hydrochlorothiazide (HYDRODIURIL) 25 MG tablet Take 1 tablet (25 mg total) by mouth daily. 90 tablet 3  . losartan (COZAAR) 100 MG tablet Take 1 tablet (100 mg total) by mouth daily. 90 tablet 3  . metoprolol tartrate (LOPRESSOR) 50 MG tablet Take 1 tablet (50 mg total) by mouth 2 (two) times daily. 180 tablet 3  . nitroGLYCERIN (NITROSTAT) 0.4 MG SL tablet Place 1 tablet (0.4 mg total) under the tongue every 5 (five) minutes as needed. 25 tablet 6  . omeprazole (PRILOSEC) 40 MG capsule Take 40 mg by mouth daily.    Marland Kitchen oxyCODONE-acetaminophen (PERCOCET) 5-325 MG tablet Take 1 tablet by  mouth every 6 (six) hours as needed for severe pain. 8 tablet 0  . simvastatin (ZOCOR) 40 MG tablet Take 1 tablet (40 mg total) by mouth at bedtime. (Patient taking differently: Take 40 mg by mouth daily. ) 90 tablet 3      Objective:   Objective     BP 90/70 (BP Location: Left Arm, Cuff Size: Normal)   Pulse 98   Temp 98.9 F (37.2 C) (Oral)   Ht 5\' 11"  (1.803 m)   Wt 159 lb 6.4 oz (72.3 kg)   SpO2 94%   BMI 22.23 kg/m   SpO2: 94 %  RA   Thin chronically ill appearing wm    HEENT: Poor dentition/  nl oropharynx. Nl external ear canals without cough reflex -  Mild bilateral non-specific turbinate edema     NECK :  without JVD/Nodes/TM/ nl carotid upstrokes bilaterally   LUNGS: no acc muscle use,  Mild barrel  contour chest wall with bilateral  Distant bs s audible wheeze much worse in RLL/dullness to percussion.     CV:  RRR  no s3 or murmur or increase in P2, and no edema   ABD:  soft and nontender with pos mid insp Hoover's  in the supine position. No bruits or organomegaly appreciated, bowel sounds nl  MS:   Nl gait/  ext warm without deformities, calf tenderness, cyanosis ? Mild  clubbing No obvious joint restrictions   SKIN: warm and dry without lesions    NEURO:  alert, approp, nl sensorium with  no motor or cerebellar deficits apparent.           I personally reviewed images and agree with radiology impression as follows:  PA  CXR:   01/22/2019  No pneumothorax post thoracentesis. Slight improvement in moderately large right pleural effusion with persistent consolidation/atelectasis particularly at the lung base. Left lung remains clear. My impression: note trachea shifted to R                  I personally reviewed images and agree with radiology impression as follows:  CXR:   01/21/2019  Moderate right pleural effusion with basilar volume loss consistent with pneumonia.  Chronic appearing  changes in the left base.    Labs ordered/ reviewed:      Chemistry           Component Value Date/Time   NA 125 (L) 01/21/2019 1700   K 3.5 01/21/2019 1700   CL 87 (L) 01/21/2019 1700   CO2 28 01/21/2019 1700   BUN <5 (L) 01/21/2019 1700   CREATININE 0.60 (L) 01/21/2019 1700           Component  Value Date/Time   CALCIUM 8.8 (L) 01/21/2019 1700   ALKPHOS 56 12/22/2016 0441   AST 27 12/22/2016 0441   ALT 29 12/22/2016 0441   BILITOT 0.6 12/22/2016 0441      BNP                             22.5              01/21/19         Lab Results  Component Value Date   WBC 8.6 01/21/2019   HGB 13.3 01/21/2019   HCT 38.4 (L) 01/21/2019   MCV 91.9 01/21/2019   PLT 345 01/21/2019       EOS                                                               0.3                                   01/22/2019        Assessment   Pleural effusion on right Onset of symptoms xmas 2019 - R Thoracentesis x one liter 01/22/2019  X 1 liter with wbc 975  Mono's > Lymphs > polys - significant atx on cxr 01/22/2019 post tap   This is almost certainly bronchogenic ca statistically though the h/o poor dentition and molar surgery is also suggestive of empyema,  The hyponatremia and atx and fluid content point to malignancy as does the persistent cp.   rec tap dry 01/27/19 then follow up with CT chest   Discussed in detail all the  indications, usual  risks and alternatives  relative to the benefits with patient who agrees to proceed with w/u as outlined.        Cigarette smoker Counseled re importance of smoking cessation but did not meet time criteria for separate billing        Hyponatremia with low BP p tcentesis 01/22/2019  D/c hydrodiuril 01/22/2019   Concerning for siadh but first step is hold hctz and do water restriction then repeat in a few weeks   Total time devoted to counseling  > 50 % of initial 60 min office visit:  review case with pt/  discussion of options/alternatives/ personally creating written customized instructions  in presence of pt  then going over those specific  Instructions directly with the pt including how to use all of the meds but in particular covering each new medication in detail and the difference between the maintenance= "automatic" meds and the prns using an action plan format for the latter (If this problem/symptom => do that organization reading Left to right).  Please see AVS from this visit for a full list of these instructions which I personally wrote for this pt and  are unique to this visit.      Christinia Gully, MD 01/22/2019      Assessment & Plan Note by Tanda Rockers, MD at 01/23/2019 7:02 AM  Author: Tanda Rockers, MD Author Type: Physician Filed: 01/23/2019 7:02 AM  Note Status: Written Cosign: Cosign Not Required Encounter Date: 01/22/2019  Problem: Essential hypertension  Editor: Tanda Rockers, MD (  Physician)     D/c hctz 01/22/2019 due to low bp and na     Assessment & Plan Note by Tanda Rockers, MD at 01/23/2019 6:58 AM  Author: Tanda Rockers, MD Author Type: Physician Filed: 01/23/2019 7:00 AM  Note Status: [place]ell List: Cosign Not Required Encounter Date: 01/22/2019  Problem: Cigarette smoker  Editor: Tanda Rockers, MD (Physician)  Prior Versions: 1. Tanda Rockers, MD (Physician) at 01/23/2019 6:58 AM - Written    Counseled re importance of smoking cessation but did not meet time criteria for separate billing          Assessment & Plan Note by Tanda Rockers, MD at 01/23/2019 6:59 AM  Author: Tanda Rockers, MD Author Type: Physician Filed: 01/23/2019 7:00 AM  Note Status: Written Cosign: Cosign Not Required Encounter Date: 01/22/2019  Problem: Hyponatremia  Editor: Tanda Rockers, MD (Physician)    D/c hydrodiuril 01/22/2019   Concerning for siadh but first step is hold hctz and do water restriction then repeat in a few weeks   Total time devoted to counseling   > 50 % of initial 60 min office visit:  review case with pt/ discussion of options/alternatives/ personally creating written customized instructions  in presence of pt  then going over those specific  Instructions directly with the pt including how to use all of the meds but in particular covering each new medication in detail and the difference between the maintenance= "automatic" meds and the prns using an action plan format for the latter (If this problem/symptom => do that organization reading Left to right).  Please see AVS from this visit for a full list of these instructions which I personally wrote for this pt and  are unique to this visit.     Assessment & Plan Note by Tanda Rockers, MD at 01/23/2019 6:56 AM  Author: Tanda Rockers, MD Author Type: Physician Filed: 01/23/2019 6:58 AM  Note Status: Written Cosign: Cosign Not Required Encounter Date: 01/22/2019  Problem: Pleural effusion on right  Editor: Tanda Rockers, MD (Physician)    Onset of symptoms xmas 2019 - R Thoracentesis x one liter 01/22/2019  X 1 liter with wbc 975  Mono's > Lymphs > polys - significant atx on cxr 01/22/2019 post tap   This is almost certainly bronchogenic ca statistically though the h/o poor dentition and molar surgery is also suggestive of empyema,  The hyponatremia and atx and fluid content point to malignancy as does the persistent cp.   rec tap dry 01/27/19 then follow up with CT chest   Discussed in detail all the  indications, usual  risks and alternatives  relative to the benefits with patient who agrees to proceed with w/u as outlined.          Patient Instructions by Tanda Rockers, MD at 01/22/2019 3:30 PM  Author: Tanda Rockers, MD Author Type: Physician Filed: 01/22/2019 4:08 PM  Note Status: Addendum Cosign: Cosign Not Required Encounter Date: 01/22/2019  Editor: Tanda Rockers, MD (Physician)  Prior Versions: 1. Tanda Rockers, MD (Physician) at 01/22/2019 3:55 PM - Signed    Stop  hydrodiuril and minimize fluid intake   Take oxycodone 5 mg 1-2 every 4 hours to control the pain, not eliminate it, and take something for constipation like MOM or miralax    I will arrange a follow thoracentesis then CT chest same day at [facility]     Instructions  Stop hydrodiuril and minimize fluid intake   Take oxycodone 5 mg 1-2 every 4 hours to control the pain, not eliminate it, and take something for constipation like MOM or miralax         I will arrange a follow thoracentesis then CT chest same day at [facility]     Repeat R Tcentesis  01/29/2019   1.3 liters,  LDH  526  Prot 3.7  Glucose 51  > cyt pos adenoca  - CT chest p T centesis 01/29/2019     02/02/19 discussed findings of pos adenoca in pleural fluid and probability of complete obst of RMSB ? Laser candidate > Discussed in detail all the  indications, usual  risks and alternatives  relative to the benefits with patient who agrees to proceed with bronchoscopy with biopsy asap    02/05/2019  Day of FOB:  No change in hx or exam    Christinia Gully, MD Pulmonary and [place] Cell [phone] After 5:30 PM or weekends, use [facility] [phone]

## 2019-02-05 ENCOUNTER — Encounter (HOSPITAL_COMMUNITY)
Admission: RE | Disposition: A | Discharge status: Home / Self Care | Payer: Self-pay | Source: Home / Self Care | Attending: Internal Medicine

## 2019-02-05 ENCOUNTER — Telehealth: Payer: Self-pay | Admitting: Internal Medicine

## 2019-02-05 ENCOUNTER — Other Ambulatory Visit: Payer: Self-pay | Admitting: Internal Medicine

## 2019-02-05 ENCOUNTER — Ambulatory Visit (HOSPITAL_COMMUNITY)
Admission: RE | Admit: 2019-02-05 | Discharge: 2019-02-05 | Disposition: A | Discharge status: Home / Self Care | Payer: Managed Care, Other (non HMO) | Attending: Internal Medicine | Admitting: Internal Medicine

## 2019-02-05 ENCOUNTER — Other Ambulatory Visit: Payer: Self-pay

## 2019-02-05 ENCOUNTER — Encounter (HOSPITAL_COMMUNITY): Payer: Self-pay | Admitting: Respiratory Therapy

## 2019-02-05 ENCOUNTER — Ambulatory Visit (HOSPITAL_COMMUNITY)
Admission: RE | Admit: 2019-02-05 | Discharge: 2019-02-05 | Disposition: A | Discharge status: Home / Self Care | Payer: Managed Care, Other (non HMO) | Source: Ambulatory Visit | Attending: Internal Medicine | Admitting: Internal Medicine

## 2019-02-05 DIAGNOSIS — I10 Essential (primary) hypertension: Secondary | ICD-10-CM | POA: Diagnosis not present

## 2019-02-05 DIAGNOSIS — I428 Other cardiomyopathies: Secondary | ICD-10-CM | POA: Diagnosis not present

## 2019-02-05 DIAGNOSIS — I252 Old myocardial infarction: Secondary | ICD-10-CM | POA: Diagnosis not present

## 2019-02-05 DIAGNOSIS — J410 Simple chronic bronchitis: Secondary | ICD-10-CM | POA: Diagnosis not present

## 2019-02-05 DIAGNOSIS — J910 Malignant pleural effusion: Secondary | ICD-10-CM | POA: Diagnosis present

## 2019-02-05 DIAGNOSIS — R918 Other nonspecific abnormal finding of lung field: Secondary | ICD-10-CM | POA: Diagnosis present

## 2019-02-05 DIAGNOSIS — Z955 Presence of coronary angioplasty implant and graft: Secondary | ICD-10-CM | POA: Insufficient documentation

## 2019-02-05 DIAGNOSIS — I2510 Atherosclerotic heart disease of native coronary artery without angina pectoris: Secondary | ICD-10-CM | POA: Diagnosis not present

## 2019-02-05 DIAGNOSIS — Z79899 Other long term (current) drug therapy: Secondary | ICD-10-CM | POA: Diagnosis not present

## 2019-02-05 DIAGNOSIS — F17210 Nicotine dependence, cigarettes, uncomplicated: Secondary | ICD-10-CM | POA: Diagnosis not present

## 2019-02-05 DIAGNOSIS — C3411 Malignant neoplasm of upper lobe, right bronchus or lung: Secondary | ICD-10-CM | POA: Diagnosis not present

## 2019-02-05 DIAGNOSIS — C3490 Malignant neoplasm of unspecified part of unspecified bronchus or lung: Secondary | ICD-10-CM

## 2019-02-05 DIAGNOSIS — J90 Pleural effusion, not elsewhere classified: Secondary | ICD-10-CM

## 2019-02-05 DIAGNOSIS — Z7982 Long term (current) use of aspirin: Secondary | ICD-10-CM | POA: Diagnosis not present

## 2019-02-05 DIAGNOSIS — K219 Gastro-esophageal reflux disease without esophagitis: Secondary | ICD-10-CM | POA: Insufficient documentation

## 2019-02-05 DIAGNOSIS — Z7902 Long term (current) use of antithrombotics/antiplatelets: Secondary | ICD-10-CM | POA: Insufficient documentation

## 2019-02-05 HISTORY — PX: VIDEO BRONCHOSCOPY: SHX5072

## 2019-02-05 SURGERY — VIDEO BRONCHOSCOPY WITHOUT FLUORO
Anesthesia: Moderate Sedation | Laterality: Bilateral

## 2019-02-05 MED ORDER — MIDAZOLAM HCL (PF) 5 MG/ML IJ SOLN: 1.0000 mg | Freq: Once | INTRAMUSCULAR | Status: DC

## 2019-02-05 MED ORDER — MIDAZOLAM HCL (PF) 10 MG/2ML IJ SOLN
INTRAMUSCULAR | Status: DC | PRN
  Administered 2019-02-05 (×2): 2.5 mg via INTRAVENOUS

## 2019-02-05 MED ORDER — LIDOCAINE HCL URETHRAL/MUCOSAL 2 % EX GEL
CUTANEOUS | Status: DC | PRN
  Administered 2019-02-05: 1

## 2019-02-05 MED ORDER — SODIUM CHLORIDE 0.9 % IV SOLN
INTRAVENOUS | Status: DC
  Administered 2019-02-05: 08:00:00 via INTRAVENOUS

## 2019-02-05 MED ORDER — FENTANYL 50 MCG/HR TD PT72: 1.0000 | MEDICATED_PATCH | TRANSDERMAL | 0 refills | Status: AC

## 2019-02-05 MED ORDER — LIDOCAINE HCL 1 % IJ SOLN
INTRAMUSCULAR | Status: DC | PRN
  Administered 2019-02-05: 6 mL via RESPIRATORY_TRACT

## 2019-02-05 MED ORDER — MEPERIDINE HCL 100 MG/ML IJ SOLN
INTRAMUSCULAR | Status: AC
  Filled 2019-02-05: qty 2

## 2019-02-05 MED ORDER — MEPERIDINE HCL 25 MG/ML IJ SOLN
INTRAMUSCULAR | Status: DC | PRN
  Administered 2019-02-05 (×2): 50 mg via INTRAVENOUS

## 2019-02-05 MED ORDER — PHENYLEPHRINE HCL 0.25 % NA SOLN: 1.0000 | Freq: Four times a day (QID) | NASAL | Status: DC | PRN

## 2019-02-05 MED ORDER — LIDOCAINE HCL 2 % EX GEL
1.0000 "application " | Freq: Once | CUTANEOUS | Status: DC
  Filled 2019-02-05: qty 4250

## 2019-02-05 MED ORDER — CLONIDINE HCL 0.2 MG PO TABS: 0.2000 mg | ORAL_TABLET | Freq: Two times a day (BID) | ORAL | 2 refills | Status: AC

## 2019-02-05 MED ORDER — MEPERIDINE HCL 100 MG/ML IJ SOLN: 100.0000 mg | Freq: Once | INTRAMUSCULAR | Status: DC

## 2019-02-05 MED ORDER — MIDAZOLAM HCL (PF) 5 MG/ML IJ SOLN
INTRAMUSCULAR | Status: AC
  Filled 2019-02-05: qty 2

## 2019-02-05 MED ORDER — OXYCODONE HCL 5 MG PO TABS: ORAL_TABLET | ORAL | 0 refills | Status: AC

## 2019-02-05 MED ORDER — PHENYLEPHRINE HCL 0.25 % NA SOLN
NASAL | Status: DC | PRN
  Administered 2019-02-05: 2 via NASAL

## 2019-02-05 NOTE — Telephone Encounter (Signed)
New order for PET has been placed for pt and it has been placed as STAT. Nothing further needed.

## 2019-02-05 NOTE — Telephone Encounter (Signed)
Let him know he can pick up the roxicodone refill now per [place] and insurance will cover it  He can pay 150 dollars now for the duragesic or wait for approval which she said should happen with/in 24 hours if we get the paperwork going - be sure that gets done asap  They will not break the boxes for duragesic and let him buy just one pending approval by insurance so that's the best I can do.  Start the clonidine right away and it will help the roxicodone work better

## 2019-02-05 NOTE — Op Note (Signed)
Bronchoscopy Procedure Note  Date of Operation: 02/05/2019   Pre-op Diagnosis: lung mass ? Endobronchial obst  Post-op Diagnosis: no endobronchial obst, cobblestoning of RUL orifice and severe concentric narrowing RML/RUL   Surgeon: Christinia Gully  Anesthesia: Monitored Local Anesthesia with Sedation Time Started: 0806 total of versed 5 mg IV/ demerol 100 mg IV Time Stopped:  0165  Operation: Video Flexible fiberoptic bronchoscopy, diagnostic   Findings: no endobronchial obst, cobblestoning of RUL orifice and severe concentric narrowing RML/RUL  Specimen: BAL rml/rul same container/ endobronchial bx RUL orifice  Estimated Blood Loss: min  Complications: none  Indications and History: See updated H and P same date. The risks, benefits, complications, treatment options and expected outcomes were discussed with the patient.  The possibilities of reaction to medication, pulmonary aspiration, perforation of a viscus, bleeding, failure to diagnose a condition and creating a complication requiring transfusion or operation were discussed with the patient who freely signed the consent.    Description of Procedure: The patient was re-examined in the bronchoscopy suite and the site of surgery properly noted/marked.  The patient was identified  and the procedure verified as Flexible Fiberoptic Bronchoscopy.  A Time Out was held and the above information confirmed.   After the induction of topical nasopharyngeal anesthesia, the patient was positioned  and the bronchoscope was passed through the R naris. The vocal cords were visualized and  1% buffered lidocaine 5 ml was topically placed onto the cords. The cords were nl . The scope was then passed into the trachea.  1% buffered lidocaine given topically. Airways inspected bilaterally to the subsegmental level with the following findings:   no endobronchial obst, cobblestoning of RUL orifice and severe concentric narrowing RML/RUL  Carina and L  sided airways ok     Interventions:  BAL RML/RUL for cyt Endobronchial bx x 6 RUL orifice      The Patient was taken to the Endoscopy Recovery area in satisfactory condition.  Attestation: I performed the procedure.  Christinia Gully, MD Pulmonary and [place] [phone] After 5:30 PM or weekends, call [phone]

## 2019-02-05 NOTE — Telephone Encounter (Signed)
MW came up and asked about the phone number for pharmacy. Have given MW the phone number to pt's pharmacy. Routing back to Dr. Melvyn Novas.

## 2019-02-05 NOTE — Telephone Encounter (Signed)
Called and spoke with pt's sister Baxter Flattery letting her know that we did place order for pt's PET to be done as stat so we could see if it could be moved up sooner than 5/1. Per tara, they did receive a call that PET has been moved up . Pt's PET is now scheduled for 4/28. Nothing further needed.

## 2019-02-05 NOTE — Progress Notes (Signed)
rec Change losartan to clonidine 0.2 mg bid for pain/ anxiety control

## 2019-02-05 NOTE — Telephone Encounter (Signed)
Called and spoke with pt's sister Baxter Flattery. Baxter Flattery is wanting to know if MW was going to send in Rx for pain patch which she stated was mentioned to them by MW after bronch this morning 4/22. Dr. Melvyn Novas, please advise on this for pt and sister Baxter Flattery. Thanks!

## 2019-02-05 NOTE — Telephone Encounter (Signed)
Called and spoke with pt's wife Jaye relaying the info from MW in regards to both the oxycodone and fentanyl patch. Stated to [facility] that they should be able to pick up the oxycodone and we should be able to get PA for fentanyl patch done and get a response within 24 hours but they could pay $150 out of pocket if they could not wait for the PA. Jaye expressed understanding. Nothing further needed.

## 2019-02-05 NOTE — Telephone Encounter (Signed)
Spoke with patient's sister. She is aware that MW will call in the RX. Advised her to check the pharmacy after 2pm. She verbalized understanding.   She also had a question about a referral to a Psychologist, sport and exercise. Reviewed his chart and it appears that MW referred the patient to Dr. Roxan Hockey. I gave her his office information and advised that if she did not hear anything by late tomorrow, Friday morning to give our office a call.   Nothing further needed at time of call.

## 2019-02-05 NOTE — Telephone Encounter (Signed)
Called pt's pharmacy again and spoke with Colletta Maryland to see if they could fill just the fentanyl patch Rx without filling the oxycodone Rx and per Colletta Maryland, the Rx was still being rejected and she also stated that a message for the fentanyl patch Rx was now stating that it needed a prior authorization and they have faxed [place] the prior authorization to our office for that med.  As of now, pt is still not able to get either the oxycodone or the fentanyl patch Rx filled.

## 2019-02-05 NOTE — Progress Notes (Signed)
Video Bronchoscopy done Interventional Bronchial washing done Interventional Bronchial biopsy done Procedure tolerated well

## 2019-02-05 NOTE — Discharge Instructions (Signed)
Flexible Bronchoscopy, Care After This sheet gives you information about how to care for yourself after your test. Your doctor may also give you more specific instructions. If you have problems or questions, contact your doctor. Follow these instructions at home: Eating and drinking  Do not eat or drink anything (not even water) for 2 hours after your test, or until your numbing medicine (local anesthetic) wears off.  When your numbness is gone and your cough and gag reflexes have come back, you may: ? Eat only soft foods. ? Slowly drink liquids.  The day after the test, go back to your normal diet. Driving  Do not drive for 24 hours if you were given a medicine to help you relax (sedative).  Do not drive or use heavy machinery while taking prescription pain medicine. General instructions   Take over-the-counter and prescription medicines only as told by your doctor.  Return to your normal activities as told. Ask what activities are safe for you.  Do not use any products that have nicotine or tobacco in them. This includes cigarettes and e-cigarettes. If you need help quitting, ask your doctor.  Keep all follow-up visits as told by your doctor. This is important. It is very important if you had a tissue sample (biopsy) taken. Get help right away if:  You have shortness of breath that gets worse.  You get light-headed.  You feel like you are going to pass out (faint).  You have chest pain.  You cough up: ? More than a little blood. ? More blood than before. Summary  Do not eat or drink anything (not even water) for 2 hours after your test, or until your numbing medicine wears off.  Do not use cigarettes. Do not use e-cigarettes.  Get help right away if you have chest pain. This information is not intended to replace advice given to you by your health care provider. Make sure you discuss any questions you have with your health care provider. Document Released: 07/30/2009  Document Revised: 10/20/2016 Document Reviewed: 10/20/2016 Elsevier Interactive Patient Education  2019 [place]  Nothing to eat or drink until  10:15   am today 02/05/2019 Any questions or concerns please call the office at [phone]

## 2019-02-05 NOTE — Telephone Encounter (Signed)
Can you get me the direct line to the pharmacist involved and I will call to see if can work this out

## 2019-02-05 NOTE — Telephone Encounter (Signed)
He should just pick up the fentanyl patch then for now and see what the appeal process is for pt with lung ca

## 2019-02-05 NOTE — Telephone Encounter (Signed)
Primary Pulmonologist: Dr. Christinia Gully Last office visit and with whom: 01/22/2019 What do we see them for (pulmonary problems): Pleural Effusion on the R  Reason for call: Spoke w/ pt's sister. She states pt is still having significant SOB and extreme pain in lungs. Pt wasn't able to schedule surgical consult until 02/17/2019 and needed a PET scan before. PET scheduled on 02/15/2019. Pt sister states surgeon's office suggested she call scheduler of PET scan, who stated order for PET scan was originally put in as "routine" and not "stat", and, therefore, could not be scheduled sooner than 03/04/2019. PET scan will need to be ordered at "stat" in order to be scheduled sooner.  In the last month, have you been in contact with someone who was confirmed or suspected to have Conoravirus / COVID-19?  No  Do you have any of the following symptoms developed in the last 30 days? Fever: No Cough: No Shortness of breath: Yes  When did your symptoms start? Since 01/27/2019 (see telephone note)   If the patient has a fever, what is the last reading?  (use n/a if patient denies fever)  N/A . IF THE PATIENT STATES THEY DO NOT OWN A THERMOMETER, THEY MUST GO AND PURCHASE ONE When did the fever start?: N/A Have you taken any medication to suppress a fever (ie Ibuprofen, Aleve, Tylenol)?: N/A  MW, please advise if an order for a STAT PET scan can be placed so pt can be evaluated sooner. Thank you.

## 2019-02-05 NOTE — Telephone Encounter (Signed)
Called pt's pharmacy and spoke with pharmacist in regards to pt's meds that were prescribed. Per pharmacy, for both the fentanyl patch and oxycodone, a message is coming up stating that the morphine dose exceeds maximum and they are not able to do an override on this to be able to get meds filled for pt.  Dr. Melvyn Novas, please advise on this. Thanks!

## 2019-02-05 NOTE — Telephone Encounter (Signed)
I will at lunch but I don't have my phone to do the authentication so can use what he has until then

## 2019-02-05 NOTE — Telephone Encounter (Signed)
That's fine

## 2019-02-06 ENCOUNTER — Other Ambulatory Visit: Payer: Self-pay | Admitting: Internal Medicine

## 2019-02-06 ENCOUNTER — Encounter: Payer: Self-pay | Admitting: *Deleted

## 2019-02-06 ENCOUNTER — Other Ambulatory Visit: Payer: Self-pay | Admitting: *Deleted

## 2019-02-06 ENCOUNTER — Encounter (HOSPITAL_COMMUNITY): Payer: Self-pay | Admitting: Internal Medicine

## 2019-02-06 DIAGNOSIS — R918 Other nonspecific abnormal finding of lung field: Secondary | ICD-10-CM

## 2019-02-06 DIAGNOSIS — C3490 Malignant neoplasm of unspecified part of unspecified bronchus or lung: Secondary | ICD-10-CM

## 2019-02-06 NOTE — Progress Notes (Signed)
Referral made 

## 2019-02-06 NOTE — Progress Notes (Signed)
The proposed treatment discussed in cancer conference 02/06/2019 is for discussion purpose only and is not a binding recommendation.  The patient was not physically examined nor present for their treatment options.  Therefore, final treatment plans cannot be decided.

## 2019-02-06 NOTE — Progress Notes (Signed)
Oncology Nurse Navigator Documentation  Oncology Nurse Navigator Flowsheets 02/06/2019  Navigator Location [facility]  Referral date to RadOnc/MedOnc 02/06/2019  Navigator Encounter Type Telephone/I received referral on Anthony Moon.  I called and scheduled him to be seen next week.  He verbalized understanding of appt time and place.   Telephone Outgoing Call  Treatment Phase Pre-Tx/Tx Discussion  Barriers/Navigation Needs Education;Coordination of Care  Education Other  Interventions Coordination of Care;Education  Coordination of Care Appts  Education Method Verbal  Acuity Level 2  Time Spent with Patient 30

## 2019-02-07 ENCOUNTER — Telehealth: Payer: Self-pay | Admitting: Internal Medicine

## 2019-02-07 ENCOUNTER — Telehealth: Payer: Self-pay | Admitting: General Surgery

## 2019-02-07 NOTE — Telephone Encounter (Signed)
Called pharmacy and confirmed prescription for pain patch was paid for out of pocket by patient. Nothing further needed.

## 2019-02-07 NOTE — Telephone Encounter (Signed)
PA form received from [place], completed and given to Dr. Melvyn Novas for signature.

## 2019-02-07 NOTE — Telephone Encounter (Signed)
Form signed by Dr. Melvyn Novas and faxed to [facility] (fax # [phone]) contact # [phone].   Fax confirmation received.  Called the patient to let him know form was processed and faxed. Will call him once we have response from [place].  Nothing further needed at this time.  IF form is needed, it is in blue scan folder by printer at [employer].

## 2019-02-10 ENCOUNTER — Telehealth: Payer: Self-pay | Admitting: Internal Medicine

## 2019-02-10 NOTE — Telephone Encounter (Signed)
Received fax from [facility] stating that the fentanyl patches has been approved until 02/07/2020 Spoke with the pt and notified  I sent fax to [facility] to let them know as well

## 2019-02-11 ENCOUNTER — Other Ambulatory Visit: Payer: Self-pay

## 2019-02-11 ENCOUNTER — Ambulatory Visit
Admission: RE | Admit: 2019-02-11 | Discharge: 2019-02-11 | Disposition: A | Discharge status: Home / Self Care | Payer: Managed Care, Other (non HMO) | Source: Ambulatory Visit | Attending: Thoracic Surgery (Cardiothoracic Vascular Surgery) | Admitting: Thoracic Surgery (Cardiothoracic Vascular Surgery)

## 2019-02-11 ENCOUNTER — Institutional Professional Consult (permissible substitution): Payer: Managed Care, Other (non HMO) | Admitting: Thoracic Surgery (Cardiothoracic Vascular Surgery)

## 2019-02-11 ENCOUNTER — Other Ambulatory Visit: Payer: Self-pay | Admitting: Thoracic Surgery (Cardiothoracic Vascular Surgery)

## 2019-02-11 ENCOUNTER — Ambulatory Visit (HOSPITAL_COMMUNITY)
Admission: RE | Admit: 2019-02-11 | Discharge: 2019-02-11 | Disposition: A | Discharge status: Home / Self Care | Payer: Managed Care, Other (non HMO) | Source: Ambulatory Visit | Attending: Internal Medicine | Admitting: Internal Medicine

## 2019-02-11 ENCOUNTER — Encounter: Payer: Self-pay | Admitting: Thoracic Surgery (Cardiothoracic Vascular Surgery)

## 2019-02-11 ENCOUNTER — Other Ambulatory Visit: Payer: Self-pay | Admitting: *Deleted

## 2019-02-11 VITALS — BP 108/68 | HR 105 | Temp 97.7°F | Resp 18 | Ht 70.0 in | Wt 158.0 lb

## 2019-02-11 DIAGNOSIS — J910 Malignant pleural effusion: Secondary | ICD-10-CM | POA: Diagnosis not present

## 2019-02-11 DIAGNOSIS — C3490 Malignant neoplasm of unspecified part of unspecified bronchus or lung: Secondary | ICD-10-CM | POA: Insufficient documentation

## 2019-02-11 DIAGNOSIS — C3411 Malignant neoplasm of upper lobe, right bronchus or lung: Secondary | ICD-10-CM

## 2019-02-11 DIAGNOSIS — J90 Pleural effusion, not elsewhere classified: Secondary | ICD-10-CM

## 2019-02-11 DIAGNOSIS — I469 Cardiac arrest, cause unspecified: Secondary | ICD-10-CM | POA: Diagnosis not present

## 2019-02-11 DIAGNOSIS — Z9889 Other specified postprocedural states: Secondary | ICD-10-CM

## 2019-02-11 DIAGNOSIS — A419 Sepsis, unspecified organism: Secondary | ICD-10-CM | POA: Diagnosis not present

## 2019-02-11 LAB — GLUCOSE, CAPILLARY: Glucose-Capillary: 116 mg/dL — ABNORMAL HIGH (ref 70–99)

## 2019-02-11 MED ORDER — FLUDEOXYGLUCOSE F - 18 (FDG) INJECTION
7.9500 | Freq: Once | INTRAVENOUS | Status: AC
  Administered 2019-02-11: 7.95 via INTRAVENOUS

## 2019-02-11 NOTE — Progress Notes (Signed)
PCP is London Pepper, MD Referring Provider is Tanda Rockers, MD  Chief Complaint  Patient presents with  . Lung Cancer    RULobe  . Pleural Effusion    Malignant...thoracentesis 4/8 AND 01/29/19    HPI: Anthony Moon is sent for consultation regarding a malignant right pleural effusion.  Anthony Moon is a 53 year old gentleman with a history of tobacco abuse, COPD, coronary disease, hypertension, pericarditis, and reflux.  He started having difficulty with his breathing and coughing back in January.  He was treated with antibiotics, but his symptoms persisted.  His shortness of breath became worse to the point where it was even at rest.  He eventually was found to have a large right pleural effusion.  He had thoracentesis on 01/22/2019 and then again on 01/29/2019.  Both times about a liter of fluid was evacuated.  Cytology was positive for adenocarcinoma.  He had bronchoscopy on 02/05/2019.  That also showed adenocarcinoma.  There was no obstructive endobronchial lesion.  A PET CT done today showed a large right pleural effusion with some shift of the heart to the left side.  There was extensive pleural involvement as well as mediastinal involvement.  There also is possible bony involvement in the sacrum.  In addition to his shortness of breath he has severe right-sided chest pain.  He also complains of swelling in his legs that has worsened over the past several days. Zubrod Score: At the time of surgery this patient's most appropriate activity status/level should be described as: []     0    Normal activity, no symptoms []     1    Restricted in physical strenuous activity but ambulatory, able to do out light work [x]     2    Ambulatory and capable of self care, unable to do work activities, up and about >50 % of waking hours                              []     3    Only limited self care, in bed greater than 50% of waking hours []     4    Completely disabled, no self care, confined to bed or chair []      5    Moribund  Past Medical History:  Diagnosis Date  . Bilateral recurrent inguinal hernia   . CAD (coronary artery disease) cardiologist-  dr Angelena Form   NSTEMI--- s/p cath 05/18/2010 DES  x2  to mLAD and pLAD not overlapping  . GERD (gastroesophageal reflux disease)   . History of non-ST elevation myocardial infarction (NSTEMI)    05-18-2010  s/p  PCI and DES x2 to LAD  . History of pericarditis    12-23-2010  resolved  . Hypertension   . Nonischemic cardiomyopathy ([place])    last ef 40-45% per echo 2011  . Rash    mid chest  . S/P drug eluting coronary stent placement    05-18-2010  to mLAD and pLAD  . Seasonal allergies   . Smokers' cough [facility])     Past Surgical History:  Procedure Laterality Date  . CORONARY ANGIOPLASTY WITH STENT PLACEMENT  05-18-2010   dr Angelena Form   PCI and DES x2 to mLAD and pLAD (not overlapping)/  hypokinesis of anterolateral wall and apex w/ ef 50%/  mild nonobstuctive cad in RCA and CFX  . INGUINAL HERNIA REPAIR Bilateral age 50  . INGUINAL HERNIA REPAIR Bilateral 12/21/2015  Procedure: LAPAROSCOPIC BILATERAL INGUINAL HERNIA REPAIR W/MESH ;  Surgeon: Arta Bruce Kinsinger, MD;  Location: [facility];  Service: General;  Laterality: Bilateral;  . IR THORACENTESIS ASP PLEURAL SPACE W/IMG GUIDE  01/22/2019  . IR THORACENTESIS ASP PLEURAL SPACE W/IMG GUIDE  01/29/2019  . MANDIBLE FRACTURE SURGERY Left 1990's  . TRANSTHORACIC ECHOCARDIOGRAM  05-18-2010   septal, apical, and inferoapical hypokinesis,  ef 40-45%/  trivial MR/  mild TR  . VIDEO BRONCHOSCOPY Bilateral 02/05/2019   Procedure: VIDEO BRONCHOSCOPY WITHOUT FLUORO;  Surgeon: Tanda Rockers, MD;  Location: [facility];  Service: Cardiopulmonary;  Laterality: Bilateral;    Family History  Problem Relation Age of Onset  . Coronary artery disease Father   . Lung disease Neg Hx     Social History Social History   Tobacco Use  . Smoking status: Current Every Day Smoker    Packs/day:  1.00    Years: 40.00    Pack years: 40.00    Types: Cigarettes  . Smokeless tobacco: Never Used  Substance Use Topics  . Alcohol use: Yes    Alcohol/week: 14.0 standard drinks    Types: 14 Cans of beer per week    Comment: 6 beers  . Drug use: No    Types: Marijuana    Comment: per pt quit long time ago    Current Outpatient Medications  Medication Sig Dispense Refill  . aspirin 81 MG tablet Take 1 tablet (81 mg total) by mouth daily. 30 tablet   . cloNIDine (CATAPRES) 0.2 MG tablet Take 1 tablet (0.2 mg total) by mouth 2 (two) times daily. 60 tablet 2  . clopidogrel (PLAVIX) 75 MG tablet Take 1 tablet (75 mg total) by mouth daily. 90 tablet 3  . fentaNYL (DURAGESIC) 50 MCG/HR Place 1 patch onto the skin every 3 (three) days. 5 patch 0  . metoprolol tartrate (LOPRESSOR) 50 MG tablet Take 1 tablet (50 mg total) by mouth 2 (two) times daily. 180 tablet 3  . nitroGLYCERIN (NITROSTAT) 0.4 MG SL tablet Place 1 tablet (0.4 mg total) under the tongue every 5 (five) minutes as needed. 25 tablet 6  . omeprazole (PRILOSEC) 40 MG capsule Take 40 mg by mouth daily.    Marland Kitchen oxyCODONE (ROXICODONE) 5 MG immediate release tablet 1 up to every 3 hours as needed for breakthru pain once duragesic applied 90 tablet 0  . simvastatin (ZOCOR) 40 MG tablet Take 1 tablet (40 mg total) by mouth at bedtime. (Patient taking differently: Take 40 mg by mouth daily. ) 90 tablet 3   No current facility-administered medications for this visit.     No Known Allergies  Review of Systems  BP 108/68 (BP Location: Right Arm, Patient Position: Sitting, Cuff Size: Normal)   Pulse (!) 105   Temp 97.7 F (36.5 C) (Temporal)   Resp 18   Ht 5\' 10"  (1.778 m)   Wt 158 lb (71.7 kg)   SpO2 93% Comment: ON RA  BMI 22.67 kg/m  Physical Exam Vitals signs reviewed.  Constitutional:      General: He is in acute distress.     Appearance: He is ill-appearing.  HENT:     Head: Normocephalic and atraumatic.  Neck:      Musculoskeletal: Normal range of motion and neck supple.  Cardiovascular:     Rate and Rhythm: Regular rhythm. Tachycardia present.     Heart sounds: Normal heart sounds. No murmur. No friction rub.  Pulmonary:     Breath  sounds: No wheezing or rales.     Comments: Increased work of breathing, absent breath sounds on right Abdominal:     General: There is no distension.     Palpations: Abdomen is soft.     Tenderness: There is no abdominal tenderness.  Musculoskeletal:     Right lower leg: Edema present.     Left lower leg: Edema present.  Lymphadenopathy:     Cervical: No cervical adenopathy.  Skin:    General: Skin is warm and dry.  Neurological:     General: No focal deficit present.     Mental Status: He is alert and oriented to person, place, and time.     Cranial Nerves: No cranial nerve deficit.     Motor: No weakness.     Coordination: Coordination normal.    Diagnostic Tests: NUCLEAR MEDICINE PET SKULL BASE TO THIGH  TECHNIQUE: 8 mCi F-18 FDG was injected intravenously. Full-ring PET imaging was performed from the skull base to thigh after the radiotracer. CT data was obtained and used for attenuation correction and anatomic localization.  Fasting blood glucose: 116 mg/dl  COMPARISON:  Chest CT 01/29/2019  FINDINGS: Mediastinal blood pool activity: SUV max 1.9  NECK: No hypermetabolic lymph nodes in the neck.  Incidental CT findings: none  CHEST: Interval progression of the loculated right pleural effusion, now essentially filling the entire right hemithorax. Septations, debris, and gas are evident within the loculated pleural fluid collection. Diffuse nodular hypermetabolism is seen along the pleural surface. Hypermetabolism in the region of the right hilum demonstrates SUV max = 6.5. Hypermetabolic focus along the lateral mid right pleura (corresponding to axial image 65/series 4) demonstrates SUV max = 8.3. Nodular hypermetabolism in the  right infrahilar region demonstrates SUV max = 6.0.  Hypermetabolic focus in the right thoracic inlet demonstrates SUV max = 11.5 although no lymphadenopathy can be identified at this location.  Hypermetabolic lesion lateral left subscapularis muscle demonstrates SUV max = 9.3.  17 mm soft tissue nodule adjacent to the cardiac apex shows no substantial hypermetabolism above blood pool activity.  Incidental CT findings: Complete right lung collapse. Mass-effect from the loculated right pleural effusion displaces cardiomediastinal anatomy into the left hemithorax. Right upper lobe bronchus is occluded. Bronchus intermedius is impacted/occluded. Small pericardial effusion evident. Coronary artery calcification is evident. Atherosclerotic calcification is noted in the wall of the thoracic aorta. No left pleural effusion. No suspicious nodule or mass in the left lung.  ABDOMEN/PELVIS: 10 mm short axis hypermetabolic nodule in the right para-aortic upper abdominal region demonstrates SUV max = 6.2.  Scattered areas of bowel uptake are identified in the abdomen.  Incidental CT findings: Liver is displaced inferiorly into the left by the expansion of the right loculated pleural fluid collection. There is abdominal aortic atherosclerosis without aneurysm.  SKELETON: Hypermetabolic left sacral lesion demonstrates SUV max = 5.1 without CT correlate.  Incidental CT findings: none  IMPRESSION: 1. Complete collapse of the right lung with marked enlargement of the loculated right pleural fluid collection. Internal septations, debris, and gas within the loculated fluid are concerning for superinfection/empyema. Circumferential pleural thickening and nodularity is associated. Mass-effect from the expanded/loculated pleural effusion displaces cardiomediastinal anatomy into the left hemithorax and generates mass-effect in the right upper quadrant of the abdomen. 2. Hypermetabolism  is seen in the parahilar collapsed right lung, and circumferentially along the right pleura. 3. Occlusion of the right upper lobe bronchus with impaction/occlusion of the bronchus intermedius. 4. Irregular and nodular hypermetabolic disease  diffusely in the right pleural space. 5. Hypermetabolism in the right thoracic inlet and medial aspect of the right upper abdomen concerning for metastatic disease. 17 mm soft tissue nodule at the cardiac apex shows no hypermetabolism on PET imaging. 6. Hypermetabolic focus posterior left sacrum, concerning for bony metastatic disease. 7. Small pericardial effusion.   Electronically Signed   By: Misty Stanley M.D.   On: 02/11/2019 12:45 I personally reviewed the CT and PET/CT images and concur with the findings noted above  Impression: Anthony Moon is a 53 year old gent with a history of tobacco abuse and COPD.  He also has a history of coronary disease.  He has a 31-month history of shortness of breath, cough, and right-sided chest pain.  He also recently has developed peripheral edema.  Work-up has revealed stage IV adenocarcinoma of the lung with a large malignant pleural effusion.  On his PET/CT today there is evidence of shift of the mediastinum to the left side which likely is impairing his venous return and is responsible for his new peripheral edema.  I recommended to Anthony Moon that we do a thoracentesis in the office today for symptomatic relief.  That is likely to be short-lived, but should be an effective temporizing maneuver.  We could then plan to place a right pleural catheter in the operating room on Friday, 02/25/2019.  I discussed the indications, risk, benefits, and alternatives of both procedures with Mr. and Mrs. Moon.  They understand the primary risk is bleeding particularly with him being on Plavix.  I do not think we have the luxury of waiting for Plavix washout prior to intervening.  They understand with pleural catheter placement  would be done in the operating room with local anesthesia and sedation.  We would do it on an outpatient basis.  They understand the risks include, but not limited to bleeding, pneumothorax, catheter malposition, catheter occlusion, and infection.  He understands accepts the risks and wished to proceed with thoracentesis today and pleural catheter placement on Friday.  Plan: Thoracentesis today for symptomatic relief Follow-up with Dr. Julien Nordmann on Thursday, 02/13/2019 Right pleural catheter placement on Friday, 02/11/2019  Melrose Nakayama, MD [facility] [phone]  Procedure After informed consent obtained right thoracentesis was performed using sterile technique and 8 mL of 1% lidocaine local anesthetic.  1.5 L of blood-tinged fluid was evacuated.  Patient tolerated well.  Revonda Standard Roxan Hockey, MD [facility] [phone]

## 2019-02-12 ENCOUNTER — Other Ambulatory Visit: Payer: Self-pay | Admitting: Medical Oncology

## 2019-02-12 ENCOUNTER — Telehealth: Payer: Self-pay | Admitting: *Deleted

## 2019-02-12 NOTE — Telephone Encounter (Signed)
Oncology Nurse Navigator Documentation  Oncology Nurse Navigator Flowsheets 02/12/2019  Navigator Location [facility]  Referral date to RadOnc/MedOnc -  Navigator Encounter Type Telephone/I received a call from Anthony Moon.  He had questions about his appt.  I called him and clarified.    Telephone Incoming Call;Outgoing Call  Treatment Phase Pre-Tx/Tx Discussion  Barriers/Navigation Needs Education  Education Other  Interventions Education  Coordination of Care Other  Education Method Verbal  Acuity Level 1  Time Spent with Patient 15

## 2019-02-13 ENCOUNTER — Encounter (HOSPITAL_COMMUNITY): Payer: Self-pay | Admitting: Vascular Surgery

## 2019-02-13 ENCOUNTER — Other Ambulatory Visit: Payer: Self-pay | Admitting: Medical Oncology

## 2019-02-13 ENCOUNTER — Inpatient Hospital Stay: Payer: Managed Care, Other (non HMO)

## 2019-02-13 ENCOUNTER — Encounter: Payer: Self-pay | Admitting: *Deleted

## 2019-02-13 ENCOUNTER — Other Ambulatory Visit: Payer: Managed Care, Other (non HMO)

## 2019-02-13 ENCOUNTER — Other Ambulatory Visit: Payer: Self-pay | Admitting: Internal Medicine

## 2019-02-13 ENCOUNTER — Inpatient Hospital Stay: Payer: Managed Care, Other (non HMO) | Admitting: Internal Medicine

## 2019-02-13 ENCOUNTER — Encounter: Payer: Self-pay | Admitting: Internal Medicine

## 2019-02-13 ENCOUNTER — Encounter (HOSPITAL_COMMUNITY): Payer: Self-pay | Admitting: *Deleted

## 2019-02-13 ENCOUNTER — Other Ambulatory Visit: Payer: Self-pay

## 2019-02-13 VITALS — BP 145/94 | HR 124 | Temp 97.6°F | Resp 20 | Ht 70.0 in | Wt 158.0 lb

## 2019-02-13 DIAGNOSIS — I10 Essential (primary) hypertension: Secondary | ICD-10-CM | POA: Diagnosis not present

## 2019-02-13 DIAGNOSIS — R918 Other nonspecific abnormal finding of lung field: Secondary | ICD-10-CM

## 2019-02-13 DIAGNOSIS — C3491 Malignant neoplasm of unspecified part of right bronchus or lung: Secondary | ICD-10-CM | POA: Diagnosis not present

## 2019-02-13 DIAGNOSIS — Z5111 Encounter for antineoplastic chemotherapy: Secondary | ICD-10-CM

## 2019-02-13 DIAGNOSIS — M7989 Other specified soft tissue disorders: Secondary | ICD-10-CM

## 2019-02-13 DIAGNOSIS — R634 Abnormal weight loss: Secondary | ICD-10-CM

## 2019-02-13 DIAGNOSIS — K4021 Bilateral inguinal hernia, without obstruction or gangrene, recurrent: Secondary | ICD-10-CM

## 2019-02-13 DIAGNOSIS — Z5112 Encounter for antineoplastic immunotherapy: Secondary | ICD-10-CM

## 2019-02-13 DIAGNOSIS — K219 Gastro-esophageal reflux disease without esophagitis: Secondary | ICD-10-CM

## 2019-02-13 DIAGNOSIS — C7951 Secondary malignant neoplasm of bone: Secondary | ICD-10-CM | POA: Diagnosis not present

## 2019-02-13 DIAGNOSIS — Z515 Encounter for palliative care: Secondary | ICD-10-CM | POA: Insufficient documentation

## 2019-02-13 DIAGNOSIS — Z801 Family history of malignant neoplasm of trachea, bronchus and lung: Secondary | ICD-10-CM

## 2019-02-13 DIAGNOSIS — I2510 Atherosclerotic heart disease of native coronary artery without angina pectoris: Secondary | ICD-10-CM

## 2019-02-13 DIAGNOSIS — Z7189 Other specified counseling: Secondary | ICD-10-CM

## 2019-02-13 DIAGNOSIS — Z8249 Family history of ischemic heart disease and other diseases of the circulatory system: Secondary | ICD-10-CM

## 2019-02-13 DIAGNOSIS — K5903 Drug induced constipation: Secondary | ICD-10-CM

## 2019-02-13 DIAGNOSIS — E785 Hyperlipidemia, unspecified: Secondary | ICD-10-CM

## 2019-02-13 DIAGNOSIS — Z87891 Personal history of nicotine dependence: Secondary | ICD-10-CM

## 2019-02-13 DIAGNOSIS — C3490 Malignant neoplasm of unspecified part of unspecified bronchus or lung: Secondary | ICD-10-CM

## 2019-02-13 DIAGNOSIS — F17210 Nicotine dependence, cigarettes, uncomplicated: Secondary | ICD-10-CM

## 2019-02-13 LAB — CBC WITH DIFFERENTIAL (CANCER CENTER ONLY)
Abs Immature Granulocytes: 0.61 10*3/uL — ABNORMAL HIGH (ref 0.00–0.07)
Basophils Absolute: 0.1 10*3/uL (ref 0.0–0.1)
Basophils Relative: 0 %
Eosinophils Absolute: 0 10*3/uL (ref 0.0–0.5)
Eosinophils Relative: 0 %
HCT: 41.4 % (ref 39.0–52.0)
Hemoglobin: 13.9 g/dL (ref 13.0–17.0)
Immature Granulocytes: 1 %
Lymphocytes Relative: 1 %
Lymphs Abs: 0.4 10*3/uL — ABNORMAL LOW (ref 0.7–4.0)
MCH: 29.6 pg (ref 26.0–34.0)
MCHC: 33.6 g/dL (ref 30.0–36.0)
MCV: 88.3 fL (ref 80.0–100.0)
Monocytes Absolute: 4 10*3/uL — ABNORMAL HIGH (ref 0.1–1.0)
Monocytes Relative: 9 %
Neutro Abs: 38.9 10*3/uL — ABNORMAL HIGH (ref 1.7–7.7)
Neutrophils Relative %: 89 %
Platelet Count: 653 10*3/uL — ABNORMAL HIGH (ref 150–400)
RBC: 4.69 MIL/uL (ref 4.22–5.81)
RDW: 13.9 % (ref 11.5–15.5)
WBC Count: 44 10*3/uL — ABNORMAL HIGH (ref 4.0–10.5)
nRBC: 0 % (ref 0.0–0.2)

## 2019-02-13 LAB — CMP (CANCER CENTER ONLY)
ALT: 34 U/L (ref 0–44)
AST: 32 U/L (ref 15–41)
Albumin: 1.7 g/dL — ABNORMAL LOW (ref 3.5–5.0)
Alkaline Phosphatase: 504 U/L — ABNORMAL HIGH (ref 38–126)
Anion gap: 12 (ref 5–15)
BUN: 16 mg/dL (ref 6–20)
CO2: 26 mmol/L (ref 22–32)
Calcium: 8.3 mg/dL — ABNORMAL LOW (ref 8.9–10.3)
Chloride: 84 mmol/L — ABNORMAL LOW (ref 98–111)
Creatinine: 0.64 mg/dL (ref 0.61–1.24)
GFR, Est AFR Am: >60 mL/min (ref >60)
GFR, Estimated: >60 mL/min (ref >60)
Glucose, Bld: 121 mg/dL — ABNORMAL HIGH (ref 70–99)
Potassium: 5.5 mmol/L — ABNORMAL HIGH (ref 3.5–5.1)
Sodium: 122 mmol/L — ABNORMAL LOW (ref 135–145)
Total Bilirubin: 0.5 mg/dL (ref 0.3–1.2)
Total Protein: 6.4 g/dL — ABNORMAL LOW (ref 6.5–8.1)

## 2019-02-13 MED ORDER — LEVOFLOXACIN 500 MG PO TABS: 500.0000 mg | ORAL_TABLET | Freq: Every day | ORAL | 0 refills | Status: AC

## 2019-02-13 MED ORDER — FUROSEMIDE 20 MG PO TABS: ORAL_TABLET | ORAL | 0 refills | Status: AC

## 2019-02-13 MED ORDER — PROPOFOL 500 MG/50ML IV EMUL
INTRAVENOUS | Status: AC
  Filled 2019-02-13: qty 100

## 2019-02-13 MED ORDER — HYDROCODONE-HOMATROPINE 5-1.5 MG/5ML PO SYRP: 5.0000 mL | ORAL_SOLUTION | Freq: Four times a day (QID) | ORAL | 0 refills | Status: AC | PRN

## 2019-02-13 MED ORDER — CYANOCOBALAMIN 1000 MCG/ML IJ SOLN
1000.0000 ug | Freq: Once | INTRAMUSCULAR | Status: AC
  Administered 2019-02-13: 15:00:00 1000 ug via INTRAMUSCULAR

## 2019-02-13 MED ORDER — CYANOCOBALAMIN 1000 MCG/ML IJ SOLN
INTRAMUSCULAR | Status: AC
  Filled 2019-02-13: qty 1

## 2019-02-13 MED ORDER — PROCHLORPERAZINE MALEATE 10 MG PO TABS: 10.0000 mg | ORAL_TABLET | Freq: Four times a day (QID) | ORAL | 0 refills | Status: AC | PRN

## 2019-02-13 MED ORDER — FOLIC ACID 1 MG PO TABS: 1.0000 mg | ORAL_TABLET | Freq: Every day | ORAL | 4 refills | Status: AC

## 2019-02-13 NOTE — Progress Notes (Signed)
SDW-pre-op call completed by pt spouse Shanon Brow [facility]). Spouse stated that he is under the care of Dr. Angelena Form, Cardiology. Spouse stated that she would call surgeon for pre-op instructions regarding Aspirin and Plavix. Spouse made aware to have pt stop taking vitamins, fish oil and herbal medications. Do not take any NSAIDs ie: Ibuprofen, Advil, Naproxen( Aleve), BC and Goody Powder. Spouse stated that pt has a Fentanyl patch applied to left shoulder. Spouse stated that pt is scheduled to have labs drawn today prior to visit with oncologist.  Spouse denies that she and pt tested positive for COVID-19.  Coronavirus Screening  Spouse denies that she and pt experienced the following symptoms ( exception noted). Cough yes/no: Yes ( pt has chronic cough related to diagnosis per spouse) Fever (>100.45F)  yes/no: No Runny nose yes/no: No Sore throat yes/no: No Difficulty breathing/shortness of breath  yes/no: No  Have you or a family member traveled in the last 14 days and where? yes/no: No  Pt spouse reminded that hospital visitation restrictions are in effect and the importance of the restrictions.  Spouse verbalized understanding of all pre-op instructions. PA, Anesthesiology, asked to review pt history; see note.

## 2019-02-13 NOTE — Progress Notes (Signed)
Anesthesia Chart Review:  Case:  [id number] Date/Time:  02/21/2019 0905   Procedure:  INSERTION PLEURAL DRAINAGE CATHETER (Right )   Anesthesia type:  Monitor Anesthesia Care   Pre-op diagnosis:  MALIGNANT RIGHT PLEURAL EFFUSION   Location:  [facility] ROOM 109 / [place] [facility]   Surgeon:  Melrose Nakayama, MD      DISCUSSION: Patient is a 53 year old male scheduled for the above procedure.  History includes smoking, stage IV lung cancer, malignant right pleural effusion (s.p thoracentesis 01/22/19, 01/29/19, 02/11/19), HTN, CAD (NSTEMI 05/18/10, s/p DES LAD x2), cardiomyopathy (mild ischemic CM, EF 45-50% 05/2010; > 55% 12/2016 echo), pericarditis (12/2010), GERD, mandibular fracture surgery.   On 02/11/19, Dr. Roxan Hockey classified patient's Zubrod Score of 2.  He required right thoracentesis, and scheduled for right Pleurex catheter on 03/11/2019. Dr. Roxan Hockey did not think there was time for a complete Plavix washout prior to intervention.   He was last seen by cardiologist Dr. Angelena Form on 12/09/18 with one year follow-up recommended. Last stress test in 2018.  He will get updated labs and anesthesia evaluation on the day of surgery.    PROVIDERS: London Pepper, MD is PCP Christinia Gully, MD is pulmonologist Lauree Chandler, MD is cardiologist   LABS: He is for labs on the day of surgery. Las Na was 125 on 01/21/19.    IMAGES: CXR 02/11/19: IMPRESSION: 1. Persistent white out of the right hemithorax secondary to a combination of necrotic tumor and postobstructive collapse of the right lung. The degree of superimposed pleural fluid is difficult to assess. 2. Loculated air within the right upper and middle lobes most consistent with entrapped air within the anterior loculated pleural effusion versus necrotic tumor. 3. Left lower lobe juxta cardiac pulmonary nodule.   EKG: 01/21/19: NSR, septal infarct (age undetermined)   CV: Nuclear stress test 12/23/16: IMPRESSION: 1. No evidence of  ischemia. Fixed anterolateral, inferior and septal defects as above . 2. Septal hypokinesis, otherwise normal left ventricular motion. 3. Left ventricular ejection fraction 49% 4. Intermediate-risk stress test findings*. *2012 Appropriate Use Criteria for Coronary Revascularization Focused Update: J Am Coll Cardiol. 0998;33(8):250-539. http://content.airportbarriers.com.aspx?articleid=1201161 (Hilty, [place], MD personally reviewed study and suspected fixed inferior defect was bowel attenuation - SDS 3, so not significant for ischemia. LVEF felt likely falsely low due to incoordinate septal motion since LVEF 60-65% by 12/22/16 echo. No additional cardiac work-up recommended at that time.)  Echo 12/22/16: Study Conclusions - Left ventricle: The cavity size was normal. There was mild   concentric hypertrophy. Systolic function was normal. The   estimated ejection fraction was in the range of 60% to 65%. Wall   motion was normal; there were no regional wall motion   abnormalities. Doppler parameters are consistent with abnormal   left ventricular relaxation (grade 1 diastolic dysfunction). - Aortic valve: There was no regurgitation. - Mitral valve: Transvalvular velocity was within the normal range.   There was no evidence for stenosis. There was trivial   regurgitation. - Right ventricle: The cavity size was normal. Wall thickness was   normal. Systolic function was normal. - Tricuspid valve: There was mild regurgitation. - Pulmonary arteries: Systolic pressure was within the normal   range. PA peak pressure: 30 mm Hg (S).  LHC 05/18/10: LM: No evidence of disease. LAD: Large vessel that coursed to the apex.  80% proximal LAD. 90% mid LAD. 90% D1 (small vessel). D2/D3 very small. CX: Moderate size vessel that gave off a large bifurcating obtuse marginal branch.  The AV groove circumflex was very small in caliber.  OM 2 was moderate in size. 30% ostial CX. 20% mid CX. OM1 mild plaque. 50%  OM2. RCA: Moderate size dominant vessel with mild plaque in the midportion. LV: Hypokinesis of the anterolateral wall and apex with EF 50%. IMPRESSION: 1.  Single-vessel coronary artery disease. 2.  Successful percutaneous coronary intervention with placement of a drug-eluting stent in the mid LAD and a drug-eluting stent in the proximal LAD. 3.  Segmental wall motion abnormality with preserved left ventricular systolic function.   Past Medical History:  Diagnosis Date  . Bilateral recurrent inguinal hernia   . CAD (coronary artery disease) cardiologist-  dr Angelena Form   NSTEMI--- s/p cath 05/18/2010 DES  x2  to mLAD and pLAD not overlapping  . GERD (gastroesophageal reflux disease)   . History of non-ST elevation myocardial infarction (NSTEMI)    05-18-2010  s/p  PCI and DES x2 to LAD  . History of pericarditis    12-23-2010  resolved  . Hypertension   . Nonischemic cardiomyopathy ([place])    last ef 40-45% per echo 2011  . Rash    mid chest  . S/P drug eluting coronary stent placement    05-18-2010  to mLAD and pLAD  . Seasonal allergies   . Smokers' cough [facility])     Past Surgical History:  Procedure Laterality Date  . CORONARY ANGIOPLASTY WITH STENT PLACEMENT  05-18-2010   dr Angelena Form   PCI and DES x2 to mLAD and pLAD (not overlapping)/  hypokinesis of anterolateral wall and apex w/ ef 50%/  mild nonobstuctive cad in RCA and CFX  . INGUINAL HERNIA REPAIR Bilateral age 59  . INGUINAL HERNIA REPAIR Bilateral 12/21/2015   Procedure: LAPAROSCOPIC BILATERAL INGUINAL HERNIA REPAIR W/MESH ;  Surgeon: Arta Bruce Kinsinger, MD;  Location: [facility];  Service: General;  Laterality: Bilateral;  . IR THORACENTESIS ASP PLEURAL SPACE W/IMG GUIDE  01/22/2019  . IR THORACENTESIS ASP PLEURAL SPACE W/IMG GUIDE  01/29/2019  . MANDIBLE FRACTURE SURGERY Left 1990's  . TRANSTHORACIC ECHOCARDIOGRAM  05-18-2010   septal, apical, and inferoapical hypokinesis,  ef 40-45%/  trivial MR/  mild TR   . VIDEO BRONCHOSCOPY Bilateral 02/05/2019   Procedure: VIDEO BRONCHOSCOPY WITHOUT FLUORO;  Surgeon: Tanda Rockers, MD;  Location: [facility];  Service: Cardiopulmonary;  Laterality: Bilateral;    MEDICATIONS: No current facility-administered medications for this encounter.    Marland Kitchen aspirin 81 MG tablet  . azelastine (ASTELIN) 0.1 % nasal spray  . cloNIDine (CATAPRES) 0.2 MG tablet  . clopidogrel (PLAVIX) 75 MG tablet  . fentaNYL (DURAGESIC) 50 MCG/HR  . metoprolol tartrate (LOPRESSOR) 50 MG tablet  . montelukast (SINGULAIR) 10 MG tablet  . nicotine (NICODERM CQ - DOSED IN MG/24 HOURS) 21 mg/24hr patch  . nitroGLYCERIN (NITROSTAT) 0.4 MG SL tablet  . omeprazole (PRILOSEC) 20 MG capsule  . oxyCODONE (ROXICODONE) 5 MG immediate release tablet  . simvastatin (ZOCOR) 40 MG tablet    Myra Gianotti, PA-C Surgical Short Stay/Anesthesiology [facility] Phone [phone] [facility] Phone [phone] 02/13/2019 10:37 AM

## 2019-02-13 NOTE — Progress Notes (Signed)
Oncology Nurse Navigator Documentation  Oncology Nurse Navigator Flowsheets 02/13/2019  Navigator Location [facility]  Referral date to RadOnc/MedOnc -  Navigator Encounter Type Clinic/MDC/I spoke with patient today at his first visit to the cancer center.  He has been Dx with stage IV lung cancer.  I gave and explained information on Dx and possible treatment options.  He was able to get is Guardant 360 blood test and I requested foundation one and PDL1 on tissue per Dr. Julien Nordmann.    Telephone -  Abnormal Finding Date 01/21/2019  Confirmed Diagnosis Date 02/05/2019  Patient Visit Type MedOnc  Treatment Phase Pre-Tx/Tx Discussion  Barriers/Navigation Needs Education;Coordination of Care  Education Understanding Cancer/ Treatment Options;Newly Diagnosed Cancer Education;Other  Interventions Coordination of Care;Education  Coordination of Care Other  Education Method Verbal;Written  Acuity Level 2  Time Spent with Patient 30

## 2019-02-13 NOTE — Progress Notes (Addendum)
[place] Telephone:([phone]) [phone]   [phone]  CONSULT NOTE  REFERRING PHYSICIAN: Dr. Modesto Charon  REASON FOR CONSULTATION:  53 years old white male recently diagnosed with lung cancer.  HPI Anthony Moon is a 53 y.o. male with past medical history significant for hypertension, GERD, coronary artery disease/myocardial infarction, bilateral recurrent inguinal hernia, dyslipidemia and long history for smoking.  The patient mentioned that he has been complaining of cough for almost a year.  He was seen few times in the past and was treated with antibiotics.  His symptoms has been getting worse since January 2020.  He was seen by his primary care physician and chest x-ray on 01/21/2019 showed moderate right pleural effusion with basilar volume loss consistent with pneumonia.  The patient was sent to [facility] and he has ultrasound-guided right thoracentesis on 01/22/2019 with drainage of 1 L of pleural fluid.  The fluid reaccumulated again and on 01/29/2019 he had another ultrasound-guided thoracentesis with drainage of 1.3 L of pleural fluid.  The cytology of the pleural fluid ([id number]) was consistent with adenocarcinoma. Immunohistochemistry was performed on the cell block material. The neoplastic cells are positive for cytokeratin 7,Moc-31, and TTF-1 but negative for calretinin and WT-1. The overall features are consistent with an adenocarcinoma and a primary lung adencarcinoma is included in the differential.  CT scan of the chest on 01/29/2019 showed persistent large loculated right pleural effusion with complete collapse of the right upper and middle lobes and ill-defined low-density area anteriorly and the collapsed right upper lobe.  The findings were suspicious for underlying neoplasm.  There was confluent right hilar and subcarinal soft tissue without discrete adenopathy.  There are scattered calcification and the subcarinal and left hilar region that could  be secondary to prior granulomatous disease.  There was also nonspecific bilateral adrenal nodules potentially metastasis and soft tissue nodule along the left cardiophrenic angle that may reflect incidental pericardial/bronchogenic cyst.  The patient was referred to Dr. Melvyn Novas and he underwent video flexible fiberoptic bronchoscopy on 02/05/2019 and the final pathology was also consistent with adenocarcinoma. The patient was referred to Dr. Roxan Hockey and he also performed at another thoracentesis yesterday with drainage of right pleural effusion.  The patient had a PET scan on 02/11/2019 and that showed complete collapse of the right lung with marked enlargement of the loculated right pleural fluid collection.  There was internal septations, debris's and gas within the loculated fluid concerning for superinfection/empyema.  There was circumferential pleural thickening and nodularity.  There was mass-effect from the expanded/loculated pleural fluid that displaces the cardiomediastinal anatomy to the left hemithorax and generate mass-effect in the right upper quadrant of the abdomen.  There was also hypermetabolism seen in the perihilar collapse right lung and circumferentially along the right pleura.  There was occlusion of the right upper lobe bronchus with impaction/occlusion of the bronchus intermedius.  The PET scan also showed irregular and nodular hypermetabolic disease diffusely in the right pleural space.  There was hypermetabolism in the right thoracic inlet and medial aspect of the right upper abdomen concerning for metastatic disease.  There was 1.7 cm soft tissue nodule at the cardiac apex with no hypermetabolism.  There was also a hypermetabolic focus in the posterior left sacrum concerning for bony metastatic disease as well as a small pericardial effusion. The patient is expected to have Pleurx catheter placed by Dr. Roxan Hockey tomorrow. He was referred to me today for evaluation and recommendation  regarding treatment of  his condition. When seen today the patient does not feel well and has persistent cough as well as chest pain and shortness of breath at baseline increased with exertion but no hemoptysis.  He also has a swelling of the lower extremities.  He lost around 25 pounds in the last 2 months.  He has constipation secondary to the narcotic medications.  The patient denied having any headache or visual changes. Family history significant for mother still alive and father died from metastatic lung cancer in 2006. The patient is married and has 1 son and 2 daughters.  His wife Anthony Moon was available by phone during the visit.  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  He has a history of smoking 1 pack/day for around 43 years and quit in March 2020.  He also has a history of alcohol abuse quit recently and no history of drug abuse.  HPI  Past Medical History:  Diagnosis Date   Bilateral recurrent inguinal hernia    CAD (coronary artery disease) cardiologist-  dr Angelena Form   NSTEMI--- s/p cath 05/18/2010 DES  x2  to mLAD and pLAD not overlapping   Cancer ([place])    lung cancer   Edema    B/LLE   GERD (gastroesophageal reflux disease)    History of non-ST elevation myocardial infarction (NSTEMI)    05-18-2010  s/p  PCI and DES x2 to LAD   History of pericarditis    12-23-2010  resolved   Hypertension    Malignant pleural effusion    right   Myocardial infarction ([place])    Nonischemic cardiomyopathy ([place])    last ef 40-45% per echo 2011   Rash    mid chest   S/P drug eluting coronary stent placement    05-18-2010  to mLAD and pLAD   Seasonal allergies    Smokers' cough ([place])    Wears glasses     Past Surgical History:  Procedure Laterality Date   CORONARY ANGIOPLASTY WITH STENT PLACEMENT  05-18-2010   dr Angelena Form   PCI and DES x2 to mLAD and pLAD (not overlapping)/  hypokinesis of anterolateral wall and apex w/ ef 50%/  mild nonobstuctive cad in  RCA and CFX   INGUINAL HERNIA REPAIR Bilateral age 32   INGUINAL HERNIA REPAIR Bilateral 12/21/2015   Procedure: LAPAROSCOPIC BILATERAL INGUINAL HERNIA REPAIR [place] ;  Surgeon: Mickeal Skinner, MD;  Location: [facility];  Service: General;  Laterality: Bilateral;   IR THORACENTESIS ASP PLEURAL SPACE W/IMG GUIDE  01/22/2019   IR THORACENTESIS ASP PLEURAL SPACE W/IMG GUIDE  01/29/2019   MANDIBLE FRACTURE SURGERY Left 1990's   TRANSTHORACIC ECHOCARDIOGRAM  05-18-2010   septal, apical, and inferoapical hypokinesis,  ef 40-45%/  trivial MR/  mild TR   VIDEO BRONCHOSCOPY Bilateral 02/05/2019   Procedure: VIDEO BRONCHOSCOPY WITHOUT FLUORO;  Surgeon: Tanda Rockers, MD;  Location: [facility];  Service: Cardiopulmonary;  Laterality: Bilateral;    Family History  Problem Relation Age of Onset   Coronary artery disease Father    Lung disease Neg Hx     Social History Social History   Tobacco Use   Smoking status: Former Smoker    Packs/day: 1.00    Years: 40.00    Pack years: 40.00    Types: Cigarettes    Last attempt to quit: 12/15/2018    Years since quitting: 0.1   Smokeless tobacco: Never Used  Substance Use Topics   Alcohol use: Not Currently  Alcohol/week: 14.0 standard drinks    Types: 14 Cans of beer per week    Comment: 6 beers   Drug use: Not Currently    Types: Marijuana    Comment: per pt quit long time ago    No Known Allergies  Current Outpatient Medications  Medication Sig Dispense Refill   aspirin 81 MG tablet Take 1 tablet (81 mg total) by mouth daily. 30 tablet    azelastine (ASTELIN) 0.1 % nasal spray Place 1 spray into both nostrils 2 (two) times daily as needed for rhinitis. Use in each nostril as directed     cloNIDine (CATAPRES) 0.2 MG tablet Take 1 tablet (0.2 mg total) by mouth 2 (two) times daily. 60 tablet 2   clopidogrel (PLAVIX) 75 MG tablet Take 1 tablet (75 mg total) by mouth daily. 90 tablet 3   fentaNYL  (DURAGESIC) 50 MCG/HR Place 1 patch onto the skin every 3 (three) days. 5 patch 0   metoprolol tartrate (LOPRESSOR) 50 MG tablet Take 1 tablet (50 mg total) by mouth 2 (two) times daily. (Patient taking differently: Take 50 mg by mouth daily. ) 180 tablet 3   montelukast (SINGULAIR) 10 MG tablet Take 10 mg by mouth daily as needed (allergies).     nicotine (NICODERM CQ - DOSED IN MG/24 HOURS) 21 mg/24hr patch Place 21 mg onto the skin daily.     nitroGLYCERIN (NITROSTAT) 0.4 MG SL tablet Place 1 tablet (0.4 mg total) under the tongue every 5 (five) minutes as needed. (Patient taking differently: Place 0.4 mg under the tongue every 5 (five) minutes as needed for chest pain. ) 25 tablet 6   omeprazole (PRILOSEC) 20 MG capsule Take 40 mg by mouth daily.     oxyCODONE (ROXICODONE) 5 MG immediate release tablet 1 up to every 3 hours as needed for breakthru pain once duragesic applied (Patient taking differently: Take 5 mg by mouth every 3 (three) hours as needed for moderate pain. ) 90 tablet 0   simvastatin (ZOCOR) 40 MG tablet Take 1 tablet (40 mg total) by mouth at bedtime. (Patient taking differently: Take 40 mg by mouth daily. ) 90 tablet 3   No current facility-administered medications for this visit.     Review of Systems  Constitutional: positive for anorexia, fatigue and weight loss Eyes: negative Ears, nose, mouth, throat, and face: negative Respiratory: positive for cough, dyspnea on exertion and pleurisy/chest pain Cardiovascular: negative Gastrointestinal: positive for constipation Genitourinary:negative Integument/breast: negative Hematologic/lymphatic: negative Musculoskeletal:positive for muscle weakness Neurological: negative Behavioral/Psych: negative Endocrine: negative Allergic/Immunologic: negative  Physical Exam  MWU:[id number], healthy, no distress, anxious, ill looking and malnourished SKIN: skin color, texture, turgor are normal, no rashes or significant  lesions HEAD: Normocephalic, No masses, lesions, tenderness or abnormalities EYES: normal, PERRLA, Conjunctiva are pink and non-injected EARS: External ears normal, Canals clear OROPHARYNX:no exudate, no erythema and lips, buccal mucosa, and tongue normal  NECK: supple, no adenopathy, no JVD LYMPH:  no palpable lymphadenopathy, no hepatosplenomegaly LUNGS: coarse sounds heard, decreased breath sounds HEART: regular rate & rhythm, no murmurs and no gallops ABDOMEN:abdomen soft, non-tender, normal bowel sounds and no masses or organomegaly BACK: Back symmetric, no curvature., No CVA tenderness EXTREMITIES:no joint deformities, effusion, or inflammation, 1+ edema bilaterally NEURO: alert & oriented x 3 with fluent speech, no focal motor/sensory deficits  PERFORMANCE STATUS: ECOG 1-2  LABORATORY DATA: Lab Results  Component Value Date   WBC 44.0 (H) 02/13/2019   HGB 13.9 02/13/2019   HCT 41.4 02/13/2019  MCV 88.3 02/13/2019   PLT 653 (H) 02/13/2019      Chemistry      Component Value Date/Time   NA 125 (L) 01/21/2019 1700   K 3.5 01/21/2019 1700   CL 87 (L) 01/21/2019 1700   CO2 28 01/21/2019 1700   BUN <5 (L) 01/21/2019 1700   CREATININE 0.60 (L) 01/21/2019 1700      Component Value Date/Time   CALCIUM 8.8 (L) 01/21/2019 1700   ALKPHOS 56 12/22/2016 0441   AST 27 12/22/2016 0441   ALT 29 12/22/2016 0441   BILITOT 0.6 12/22/2016 0441       RADIOGRAPHIC STUDIES: Dg Chest 1 View  Result Date: 01/22/2019 CLINICAL DATA:  Post   thora  Rt  side EXAM: CHEST  1 VIEW COMPARISON:  the previous day's study FINDINGS: No pneumothorax post thoracentesis. Slight improvement in moderately large right pleural effusion with persistent consolidation/atelectasis particularly at the lung base. Left lung remains clear. Heart size and mediastinal contours are within normal limits. Visualized bones unremarkable. IMPRESSION: 1. No pneumothorax post right thoracentesis Electronically Signed   By:  Lucrezia Europe M.D.   On: 01/22/2019 11:46   Dg Chest 2 View  Result Date: 02/11/2019 CLINICAL DATA:  53 year old male with right-sided pleural effusion. EXAM: CHEST - 2 VIEW COMPARISON:  Prior chest x-ray 01/22/2019; prior PET-CT 02/11/2019 FINDINGS: Complete whiteout of the right lung. A small amount of loculated air is present within the right lung base likely representing entrapped air within necrotic tumor. There is no normally aerated lung visible. The left lung remains well aerated. Nodular opacity adjacent to the left cardiac margin again noted. No acute osseous abnormality. IMPRESSION: 1. Persistent white out of the right hemithorax secondary to a combination of necrotic tumor and postobstructive collapse of the right lung. The degree of superimposed pleural fluid is difficult to assess. 2. Loculated air within the right upper and middle lobes most consistent with entrapped air within the anterior loculated pleural effusion versus necrotic tumor. 3. Left lower lobe juxta cardiac pulmonary nodule. Electronically Signed   By: Jacqulynn Cadet M.D.   On: 02/11/2019 14:11   Dg Chest 2 View  Result Date: 01/21/2019 CLINICAL DATA:  Cough and right-sided chest pain for several months EXAM: CHEST - 2 VIEW COMPARISON:  06/04/2018 FINDINGS: Cardiac shadows within normal limits. Left lung is hyperinflated with stable chronic changes along the left base similar to that seen on the prior exam. This projects in the lingula on the lateral projection. Moderate right-sided pleural effusion is noted with right basilar volume loss consistent with acute infiltrate. No bony abnormality is noted. IMPRESSION: Moderate right pleural effusion with basilar volume loss consistent with pneumonia. Chronic appearing changes in the left base. Electronically Signed   By: Inez Catalina M.D.   On: 01/21/2019 10:14   Ct Chest W Contrast  Result Date: 01/29/2019 CLINICAL DATA:  Pleural effusion, not elsewhere classified. Thoracentesis  earlier today. Patient presented with cough and right-sided chest pain for several months. Atypical cells were noted on thoracentesis performed 1 week ago. EXAM: CT CHEST WITH CONTRAST TECHNIQUE: Multidetector CT imaging of the chest was performed during intravenous contrast administration. CONTRAST:  17mL ISOVUE-300 IOPAMIDOL (ISOVUE-300) INJECTION 61% COMPARISON:  Radiographs 06/04/2018, 01/21/2019 and 01/22/2019. FINDINGS: Cardiovascular: Moderate three-vessel coronary artery atherosclerosis. There is lesser atherosclerosis of the aorta and great vessels. The pulmonary arteries appear normal. There is a small pericardial effusion. The heart size is normal. Mediastinum/Nodes: No discretely enlarged mediastinal, hilar or axillary lymph  nodes are identified. However, there is increased soft tissue fullness around the right hilum with extension into the subcarinal space. There are scattered small subcarinal and left hilar calcifications.There is a nodule at the left cardiophrenic angle which measures 3.0 x 2.2 cm and 31 HU (image 124/5). This protrudes slightly into the lingula. Based on prior radiographs, this may reflect an incidental pericardial/bronchogenic cyst. The thyroid gland, trachea and esophagus demonstrate no significant findings. Lungs/Pleura: There is a large partially loculated right pleural effusion. No pneumothorax following thoracentesis. The right upper and middle lobes are completely collapsed and opacified, and there is proximal cut off of the corresponding bronchi. There is partial collapse of the right lower lobe. Anteriorly in the right upper lobe, there is an ill-defined area of decreased attenuation in the collapsed lung which measures approximately 4.0 x 3.2 x 3.1 cm. This could reflect an underlying lung mass or pneumonia. As above nodule adjacent to the left cardiophrenic angle may reflect an incidental pericardial/bronchogenic cyst, similar to prior chest radiographs. No significant  left pleural effusion or other left lung nodularity. Upper abdomen: There are small nonspecific bilateral adrenal nodules, measuring 1.4 cm on the right and 1.5 cm on the left (image 162/5). These demonstrate nonspecific attenuation. No focal hepatic abnormality identified. Musculoskeletal/Chest wall: There is no chest wall mass or suspicious osseous finding. IMPRESSION: 1. Persistent large loculated right pleural effusion with complete collapse of the right upper and middle lobes and an ill-defined low-density area anteriorly in the collapsed right upper lobe. Although these findings could be secondary to pneumonia with a parapneumonic effusion, underlying neoplasm (likely primary bronchogenic carcinoma) is suspected. 2. Confluent right hilar and subcarinal soft tissue without discrete adenopathy. There are scattered calcifications in the subcarinal and left hilar regions which may be secondary to prior granulomatous disease. 3. Nonspecific bilateral adrenal nodules, potentially metastases. 4. Soft tissue nodule along the left cardiophrenic angle appears similar to prior chest radiographs and may reflect an incidental pericardial/bronchogenic cyst. Electronically Signed   By: Richardean Sale M.D.   On: 01/29/2019 10:12   Nm Pet Image Initial (pi) Skull Base To Thigh  Result Date: 02/11/2019 CLINICAL DATA:  Initial treatment strategy for non-small cell lung cancer. EXAM: NUCLEAR MEDICINE PET SKULL BASE TO THIGH TECHNIQUE: 8 mCi F-18 FDG was injected intravenously. Full-ring PET imaging was performed from the skull base to thigh after the radiotracer. CT data was obtained and used for attenuation correction and anatomic localization. Fasting blood glucose: 116 mg/dl COMPARISON:  Chest CT 01/29/2019 FINDINGS: Mediastinal blood pool activity: SUV max 1.9 NECK: No hypermetabolic lymph nodes in the neck. Incidental CT findings: none CHEST: Interval progression of the loculated right pleural effusion, now essentially  filling the entire right hemithorax. Septations, debris, and gas are evident within the loculated pleural fluid collection. Diffuse nodular hypermetabolism is seen along the pleural surface. Hypermetabolism in the region of the right hilum demonstrates SUV max = 6.5. Hypermetabolic focus along the lateral mid right pleura (corresponding to axial image 65/series 4) demonstrates SUV max = 8.3. Nodular hypermetabolism in the right infrahilar region demonstrates SUV max = 6.0. Hypermetabolic focus in the right thoracic inlet demonstrates SUV max = 11.5 although no lymphadenopathy can be identified at this location. Hypermetabolic lesion lateral left subscapularis muscle demonstrates SUV max = 9.3. 17 mm soft tissue nodule adjacent to the cardiac apex shows no substantial hypermetabolism above blood pool activity. Incidental CT findings: Complete right lung collapse. Mass-effect from the loculated right pleural effusion displaces cardiomediastinal anatomy  into the left hemithorax. Right upper lobe bronchus is occluded. Bronchus intermedius is impacted/occluded. Small pericardial effusion evident. Coronary artery calcification is evident. Atherosclerotic calcification is noted in the wall of the thoracic aorta. No left pleural effusion. No suspicious nodule or mass in the left lung. ABDOMEN/PELVIS: 10 mm short axis hypermetabolic nodule in the right para-aortic upper abdominal region demonstrates SUV max = 6.2. Scattered areas of bowel uptake are identified in the abdomen. Incidental CT findings: Liver is displaced inferiorly into the left by the expansion of the right loculated pleural fluid collection. There is abdominal aortic atherosclerosis without aneurysm. SKELETON: Hypermetabolic left sacral lesion demonstrates SUV max = 5.1 without CT correlate. Incidental CT findings: none IMPRESSION: 1. Complete collapse of the right lung with marked enlargement of the loculated right pleural fluid collection. Internal  septations, debris, and gas within the loculated fluid are concerning for superinfection/empyema. Circumferential pleural thickening and nodularity is associated. Mass-effect from the expanded/loculated pleural effusion displaces cardiomediastinal anatomy into the left hemithorax and generates mass-effect in the right upper quadrant of the abdomen. 2. Hypermetabolism is seen in the parahilar collapsed right lung, and circumferentially along the right pleura. 3. Occlusion of the right upper lobe bronchus with impaction/occlusion of the bronchus intermedius. 4. Irregular and nodular hypermetabolic disease diffusely in the right pleural space. 5. Hypermetabolism in the right thoracic inlet and medial aspect of the right upper abdomen concerning for metastatic disease. 17 mm soft tissue nodule at the cardiac apex shows no hypermetabolism on PET imaging. 6. Hypermetabolic focus posterior left sacrum, concerning for bony metastatic disease. 7. Small pericardial effusion. Electronically Signed   By: Misty Stanley M.D.   On: 02/11/2019 12:45   Ir Thoracentesis Asp Pleural Space W/img Guide  Result Date: 01/29/2019 INDICATION: Patient with recurrent right pleural effusion. Request is made for diagnostic and therapeutic thoracentesis. EXAM: ULTRASOUND GUIDED DIAGNOSTIC AND THERAPEUTIC RIGHT THORACENTESIS MEDICATIONS: 10 mL 1% lidocaine COMPLICATIONS: None immediate. PROCEDURE: An ultrasound guided thoracentesis was thoroughly discussed with the patient and questions answered. The benefits, risks, alternatives and complications were also discussed. The patient understands and wishes to proceed with the procedure. Written consent was obtained. Ultrasound was performed to localize and mark an adequate pocket of fluid in the right chest. The area was then prepped and draped in the normal sterile fashion. 1% Lidocaine was used for local anesthesia. Under ultrasound guidance a 6 Fr Safe-T-Centesis catheter was introduced.  Thoracentesis was performed. The catheter was removed and a dressing applied. FINDINGS: A total of approximately 1.3 liters of amber fluid was removed. Samples were sent to the laboratory as requested by the clinical team. IMPRESSION: Successful ultrasound guided diagnostic and therapeutic right thoracentesis yielding 1.3 liters of pleural fluid. Read by: Brynda Greathouse PA-C Electronically Signed   By: Markus Daft M.D.   On: 01/29/2019 11:38   Ir Thoracentesis Asp Pleural Space W/img Guide  Result Date: 01/22/2019 CLINICAL DATA:  Shortness of breath, moderate right pleural effusion EXAM: EXAM THORACENTESIS WITH ULTRASOUND TECHNIQUE: The procedure, risks (including but not limited to bleeding, infection, organ damage ), benefits, and alternatives were explained to the patient. Questions regarding the procedure were encouraged and answered. The patient understands and consents to the procedure. Survey ultrasound of the right hemithorax was performed and an appropriate skin entry site was localized. Site was marked, prepped with Betadine, draped in usual sterile fashion, infiltrated locally with 1% lidocaine. The Saf-T-Centesis needle was advanced into the pleural space. Slightly turbid yellow fluid returned. 1 L was removed.  Sample was sent for the requested laboratory studies. The patient tolerated procedure well. COMPLICATIONS: COMPLICATIONS None immediate IMPRESSION: Technically successful ultrasound-guided right thoracentesis. Follow-up chest radiograph pending. Electronically Signed   By: Lucrezia Europe M.D.   On: 01/22/2019 10:51    ASSESSMENT: This is a very pleasant 53 years old white male with a stage IV (T2b, N2, M1 C) non-small cell lung cancer, adenocarcinoma presented with right upper lobe lung mass in addition to right hilar and mediastinal lymphadenopathy as well as malignant pleural effusion and pleural-based metastasis and bony metastatic disease in the left sacrum diagnosed in April 2020. The  patient has negative PDL 1 expression.  PLAN: I had a lengthy discussion with the patient and his wife today about his current disease stage, prognosis and treatment options. I recommended for the patient to complete the staging work-up by ordering MRI of the brain to rule out brain metastasis. I explained to the patient that he has incurable condition and all the treatment will be of palliative nature.  I discussed with the patient his prognosis with and without treatment. I gave the patient the option of palliative care and hospice referral versus consideration of palliative systemic chemotherapy with carboplatin, Alimta and Keytruda versus waiting for the molecular studies for consideration of targeted therapy if the patient has any actionable mutations. The patient is interested in treatment but he would like to hold his systemic chemotherapy until the molecular studies becomes available. We will [facility] and will also send a blood test to [facility] for molecular studies. I discussed with the patient the adverse effect of the chemotherapy including but not limited to alopecia, myelosuppression, nausea and vomiting, peripheral neuropathy, liver or renal dysfunction as well as the adverse effect of the immunotherapy. If the patient has no actionable medications, he is expected to start the first dose of his treatment on Feb 25, 2019. I will arrange for the patient to receive vitamin B12 injection today and I will call his pharmacy with prescription for Compazine 10 mg p.o. every 6 hours as needed for nausea, folic acid 1 mg p.o. daily. He will have a chemotherapy education class before the first dose of his treatment. I will also give the patient prescription for Hycodan 5 mL p.o. every 6 hours as needed for cough. For the swelling of the lower extremities, I will start the patient on Lasix 20 mg p.o. daily as needed for the swelling. CBC in which became available  after the patient left the clinic showed significant elevation of his total white blood count and this could be concerning for empyema of the right pleural effusion.  I will start the patient empirically on Levaquin 500 mg p.o. daily.  He is scheduled to see Dr. Roxan Hockey tomorrow for the Pleurx catheter drainage and I will ask Dr. Roxan Hockey to send the pleural fluid for Gram stain and culture sensitivity. He will come back for follow-up visit 1 week after his treatment for evaluation and management of any adverse effect of his treatment. The patient was advised to call immediately if he has any concerning symptoms in the interval. The patient voices understanding of current disease status and treatment options and is in agreement with the current care plan.  All questions were answered. The patient knows to call the clinic with any problems, questions or concerns. We can certainly see the patient much sooner if necessary.  Thank you so much for allowing me to participate in the care  of Anthony Moon. I will continue to follow up the patient with you and assist in his care.  I spent 55 minutes counseling the patient face to face. The total time spent in the appointment was 80 minutes.  Disclaimer: This note was dictated with voice recognition software. Similar sounding words can inadvertently be transcribed and may not be corrected upon review.   Eilleen Kempf February 13, 2019, 2:02 PM

## 2019-02-13 NOTE — Anesthesia Preprocedure Evaluation (Deleted)
Anesthesia Evaluation    Airway        Dental   Pulmonary Current Smoker,           Cardiovascular hypertension,      Neuro/Psych    GI/Hepatic   Endo/Other    Renal/GU      Musculoskeletal   Abdominal   Peds  Hematology   Anesthesia Other Findings   Reproductive/Obstetrics                             Anesthesia Physical Anesthesia Plan  ASA:   Anesthesia Plan:    Post-op Pain Management:    Induction:   PONV Risk Score and Plan:   Airway Management Planned:   Additional Equipment:   Intra-op Plan:   Post-operative Plan:   Informed Consent:   Plan Discussed with:   Anesthesia Plan Comments: (PAT note written 02/13/2019 by Myra Gianotti, PA-C. )        Anesthesia Quick Evaluation

## 2019-02-13 NOTE — Progress Notes (Signed)
START ON PATHWAY REGIMEN - Non-Small Cell Lung     A cycle is every 21 days:     Pemetrexed      Carboplatin   **Always confirm dose/schedule in your pharmacy ordering system**  Patient Characteristics: Stage IV Metastatic, Nonsquamous, Initial Chemotherapy/Immunotherapy, PS = 0, 1, ALK or EGFR or ROS1 or NTRK Genomic Alterations - Awaiting Test Results AJCC T Category: T2b Current Disease Status: Distant Metastases AJCC N Category: N2 AJCC M Category: M1c AJCC 8 Stage Grouping: IVB Histology: Nonsquamous Cell ROS1 Rearrangement Status: Awaiting Test Results T790M Mutation Status: Not Applicable - EGFR Mutation Negative/Unknown Other Mutations/Biomarkers: No Other Actionable Mutations NTRK Gene Fusion Status: Awaiting Test Results PD-L1 Expression Status: Awaiting Test Results Chemotherapy/Immunotherapy LOT: Initial Chemotherapy/Immunotherapy Molecular Targeted Therapy: Not Appropriate ALK Rearrangement Status: Awaiting Test Results EGFR Mutation Status: Awaiting Test Results BRAF V600E Mutation Status: Awaiting Test Results ECOG Performance Status: 1 Intent of Therapy: Non-Curative / Palliative Intent, Discussed with Patient

## 2019-02-14 ENCOUNTER — Ambulatory Visit (HOSPITAL_COMMUNITY)
Admission: RE | Admit: 2019-02-14 | Payer: Managed Care, Other (non HMO) | Source: Home / Self Care | Admitting: Thoracic Surgery (Cardiothoracic Vascular Surgery)

## 2019-02-14 ENCOUNTER — Inpatient Hospital Stay (HOSPITAL_COMMUNITY): Payer: Managed Care, Other (non HMO)

## 2019-02-14 ENCOUNTER — Other Ambulatory Visit: Payer: Self-pay

## 2019-02-14 ENCOUNTER — Emergency Department (HOSPITAL_COMMUNITY): Payer: Managed Care, Other (non HMO)

## 2019-02-14 ENCOUNTER — Other Ambulatory Visit (HOSPITAL_COMMUNITY): Payer: Self-pay

## 2019-02-14 ENCOUNTER — Encounter (HOSPITAL_COMMUNITY): Admission: EM | Disposition: E | Payer: Self-pay | Source: Home / Self Care | Attending: Pulmonary Disease

## 2019-02-14 ENCOUNTER — Inpatient Hospital Stay (HOSPITAL_COMMUNITY)
Admission: EM | Admit: 2019-02-14 | Discharge: 2019-03-17 | DRG: 871 | Disposition: E | Payer: Managed Care, Other (non HMO) | Attending: Pulmonary Disease | Admitting: Pulmonary Disease

## 2019-02-14 ENCOUNTER — Other Ambulatory Visit: Payer: Self-pay | Admitting: *Deleted

## 2019-02-14 ENCOUNTER — Ambulatory Visit (HOSPITAL_COMMUNITY): Payer: Managed Care, Other (non HMO)

## 2019-02-14 DIAGNOSIS — Z955 Presence of coronary angioplasty implant and graft: Secondary | ICD-10-CM | POA: Diagnosis not present

## 2019-02-14 DIAGNOSIS — E871 Hypo-osmolality and hyponatremia: Secondary | ICD-10-CM | POA: Diagnosis present

## 2019-02-14 DIAGNOSIS — I469 Cardiac arrest, cause unspecified: Secondary | ICD-10-CM | POA: Diagnosis present

## 2019-02-14 DIAGNOSIS — E43 Unspecified severe protein-calorie malnutrition: Secondary | ICD-10-CM | POA: Diagnosis present

## 2019-02-14 DIAGNOSIS — Z7902 Long term (current) use of antithrombotics/antiplatelets: Secondary | ICD-10-CM

## 2019-02-14 DIAGNOSIS — C3490 Malignant neoplasm of unspecified part of unspecified bronchus or lung: Secondary | ICD-10-CM | POA: Diagnosis present

## 2019-02-14 DIAGNOSIS — E872 Acidosis: Secondary | ICD-10-CM

## 2019-02-14 DIAGNOSIS — C3491 Malignant neoplasm of unspecified part of right bronchus or lung: Secondary | ICD-10-CM | POA: Diagnosis not present

## 2019-02-14 DIAGNOSIS — R092 Respiratory arrest: Secondary | ICD-10-CM

## 2019-02-14 DIAGNOSIS — Z66 Do not resuscitate: Secondary | ICD-10-CM | POA: Diagnosis present

## 2019-02-14 DIAGNOSIS — J869 Pyothorax without fistula: Secondary | ICD-10-CM | POA: Diagnosis present

## 2019-02-14 DIAGNOSIS — R402332 Coma scale, best motor response, abnormal, at arrival to emergency department: Secondary | ICD-10-CM | POA: Diagnosis present

## 2019-02-14 DIAGNOSIS — C782 Secondary malignant neoplasm of pleura: Secondary | ICD-10-CM | POA: Diagnosis present

## 2019-02-14 DIAGNOSIS — J910 Malignant pleural effusion: Secondary | ICD-10-CM | POA: Diagnosis present

## 2019-02-14 DIAGNOSIS — Z7982 Long term (current) use of aspirin: Secondary | ICD-10-CM

## 2019-02-14 DIAGNOSIS — I10 Essential (primary) hypertension: Secondary | ICD-10-CM | POA: Diagnosis present

## 2019-02-14 DIAGNOSIS — R6521 Severe sepsis with septic shock: Secondary | ICD-10-CM | POA: Diagnosis present

## 2019-02-14 DIAGNOSIS — R402212 Coma scale, best verbal response, none, at arrival to emergency department: Secondary | ICD-10-CM | POA: Diagnosis present

## 2019-02-14 DIAGNOSIS — I314 Cardiac tamponade: Secondary | ICD-10-CM | POA: Diagnosis present

## 2019-02-14 DIAGNOSIS — K219 Gastro-esophageal reflux disease without esophagitis: Secondary | ICD-10-CM | POA: Diagnosis present

## 2019-02-14 DIAGNOSIS — I2510 Atherosclerotic heart disease of native coronary artery without angina pectoris: Secondary | ICD-10-CM | POA: Diagnosis present

## 2019-02-14 DIAGNOSIS — E8729 Other acidosis: Secondary | ICD-10-CM

## 2019-02-14 DIAGNOSIS — A419 Sepsis, unspecified organism: Principal | ICD-10-CM | POA: Diagnosis present

## 2019-02-14 DIAGNOSIS — I252 Old myocardial infarction: Secondary | ICD-10-CM

## 2019-02-14 DIAGNOSIS — R402112 Coma scale, eyes open, never, at arrival to emergency department: Secondary | ICD-10-CM | POA: Diagnosis present

## 2019-02-14 DIAGNOSIS — G931 Anoxic brain damage, not elsewhere classified: Secondary | ICD-10-CM | POA: Diagnosis present

## 2019-02-14 DIAGNOSIS — Z87891 Personal history of nicotine dependence: Secondary | ICD-10-CM

## 2019-02-14 DIAGNOSIS — J9601 Acute respiratory failure with hypoxia: Secondary | ICD-10-CM | POA: Diagnosis present

## 2019-02-14 DIAGNOSIS — E874 Mixed disorder of acid-base balance: Secondary | ICD-10-CM | POA: Diagnosis present

## 2019-02-14 DIAGNOSIS — I255 Ischemic cardiomyopathy: Secondary | ICD-10-CM | POA: Diagnosis present

## 2019-02-14 DIAGNOSIS — Z515 Encounter for palliative care: Secondary | ICD-10-CM | POA: Diagnosis not present

## 2019-02-14 DIAGNOSIS — I462 Cardiac arrest due to underlying cardiac condition: Secondary | ICD-10-CM | POA: Diagnosis present

## 2019-02-14 DIAGNOSIS — Z8249 Family history of ischemic heart disease and other diseases of the circulatory system: Secondary | ICD-10-CM

## 2019-02-14 DIAGNOSIS — K4020 Bilateral inguinal hernia, without obstruction or gangrene, not specified as recurrent: Secondary | ICD-10-CM | POA: Diagnosis present

## 2019-02-14 DIAGNOSIS — I4901 Ventricular fibrillation: Secondary | ICD-10-CM | POA: Diagnosis present

## 2019-02-14 DIAGNOSIS — J90 Pleural effusion, not elsewhere classified: Secondary | ICD-10-CM

## 2019-02-14 DIAGNOSIS — T797XXA Traumatic subcutaneous emphysema, initial encounter: Secondary | ICD-10-CM

## 2019-02-14 DIAGNOSIS — E875 Hyperkalemia: Secondary | ICD-10-CM

## 2019-02-14 HISTORY — DX: Acute myocardial infarction, unspecified: I21.9

## 2019-02-14 HISTORY — DX: Malignant (primary) neoplasm, unspecified: C80.1

## 2019-02-14 HISTORY — DX: Edema, unspecified: R60.9

## 2019-02-14 HISTORY — DX: Malignant pleural effusion: J91.0

## 2019-02-14 HISTORY — DX: Presence of spectacles and contact lenses: Z97.3

## 2019-02-14 LAB — CBC WITH DIFFERENTIAL/PLATELET
Abs Immature Granulocytes: 0 10*3/uL (ref 0.00–0.07)
Band Neutrophils: 5 %
Basophils Absolute: 0 10*3/uL (ref 0.0–0.1)
Basophils Relative: 0 %
Eosinophils Absolute: 0 10*3/uL (ref 0.0–0.5)
Eosinophils Relative: 0 %
HCT: 33.3 % — ABNORMAL LOW (ref 39.0–52.0)
Hemoglobin: 10.8 g/dL — ABNORMAL LOW (ref 13.0–17.0)
Lymphocytes Relative: 3 %
Lymphs Abs: 1.5 10*3/uL (ref 0.7–4.0)
MCH: 30.4 pg (ref 26.0–34.0)
MCHC: 32.4 g/dL (ref 30.0–36.0)
MCV: 93.8 fL (ref 80.0–100.0)
Monocytes Absolute: 5.3 10*3/uL — ABNORMAL HIGH (ref 0.1–1.0)
Monocytes Relative: 11 %
Neutro Abs: 41.6 10*3/uL — ABNORMAL HIGH (ref 1.7–7.7)
Neutrophils Relative %: 81 %
Platelets: 461 10*3/uL — ABNORMAL HIGH (ref 150–400)
RBC: 3.55 MIL/uL — ABNORMAL LOW (ref 4.22–5.81)
RDW: 14.1 % (ref 11.5–15.5)
WBC: 48.4 10*3/uL — ABNORMAL HIGH (ref 4.0–10.5)
nRBC: 0 % (ref 0.0–0.2)

## 2019-02-14 LAB — BLOOD GAS, ARTERIAL
Acid-base deficit: 10.4 mmol/L — ABNORMAL HIGH (ref 0.0–2.0)
Bicarbonate: 20.2 mmol/L (ref 20.0–28.0)
Drawn by: 252031
FIO2: 100
MECHVT: 500 mL
O2 Saturation: 96.7 %
PEEP: 10 cmH2O
Patient temperature: 98.6
RATE: 18 resp/min
pCO2 arterial: 94.7 mmHg (ref 32.0–48.0)
pH, Arterial: 6.958 — CL (ref 7.350–7.450)
pO2, Arterial: 193 mmHg — ABNORMAL HIGH (ref 83.0–108.0)

## 2019-02-14 LAB — POCT I-STAT 7, (LYTES, BLD GAS, ICA,H+H)
Acid-Base Excess: 3 mmol/L — ABNORMAL HIGH (ref 0.0–2.0)
Bicarbonate: 26.9 mmol/L (ref 20.0–28.0)
Calcium, Ion: 1.04 mmol/L — ABNORMAL LOW (ref 1.15–1.40)
HCT: 37 % — ABNORMAL LOW (ref 39.0–52.0)
Hemoglobin: 12.6 g/dL — ABNORMAL LOW (ref 13.0–17.0)
O2 Saturation: 100 %
Patient temperature: 98
Potassium: 4.2 mmol/L (ref 3.5–5.1)
Sodium: 121 mmol/L — ABNORMAL LOW (ref 135–145)
TCO2: 28 mmol/L (ref 22–32)
pCO2 arterial: 37.3 mmHg (ref 32.0–48.0)
pH, Arterial: 7.464 — ABNORMAL HIGH (ref 7.350–7.450)
pO2, Arterial: 526 mmHg — ABNORMAL HIGH (ref 83.0–108.0)

## 2019-02-14 LAB — TROPONIN I: Troponin I: 0.06 ng/mL (ref <0.03)

## 2019-02-14 LAB — COMPREHENSIVE METABOLIC PANEL
ALT: 29 U/L (ref 0–44)
AST: 39 U/L (ref 15–41)
Albumin: 1.2 g/dL — ABNORMAL LOW (ref 3.5–5.0)
Alkaline Phosphatase: 303 U/L — ABNORMAL HIGH (ref 38–126)
Anion gap: 20 — ABNORMAL HIGH (ref 5–15)
BUN: 16 mg/dL (ref 6–20)
CO2: 17 mmol/L — ABNORMAL LOW (ref 22–32)
Calcium: 7.8 mg/dL — ABNORMAL LOW (ref 8.9–10.3)
Chloride: 83 mmol/L — ABNORMAL LOW (ref 98–111)
Creatinine, Ser: 0.96 mg/dL (ref 0.61–1.24)
GFR calc Af Amer: >60 mL/min (ref >60)
GFR calc non Af Amer: >60 mL/min (ref >60)
Glucose, Bld: 127 mg/dL — ABNORMAL HIGH (ref 70–99)
Potassium: 5.6 mmol/L — ABNORMAL HIGH (ref 3.5–5.1)
Sodium: 120 mmol/L — ABNORMAL LOW (ref 135–145)
Total Bilirubin: 0.6 mg/dL (ref 0.3–1.2)
Total Protein: 4.2 g/dL — ABNORMAL LOW (ref 6.5–8.1)

## 2019-02-14 LAB — TRIGLYCERIDES: Triglycerides: 58 mg/dL

## 2019-02-14 LAB — LACTATE DEHYDROGENASE, PLEURAL OR PERITONEAL FLUID: LD, Fluid: >10000 U/L — ABNORMAL HIGH (ref 3–23)

## 2019-02-14 LAB — PROTIME-INR
INR: 1.5 — ABNORMAL HIGH (ref 0.8–1.2)
Prothrombin Time: 18.3 seconds — ABNORMAL HIGH (ref 11.4–15.2)

## 2019-02-14 LAB — TYPE AND SCREEN
ABO/RH(D): O POS
Antibody Screen: NEGATIVE

## 2019-02-14 LAB — ABO/RH: ABO/RH(D): O POS

## 2019-02-14 LAB — CBG MONITORING, ED: Glucose-Capillary: 84 mg/dL (ref 70–99)

## 2019-02-14 LAB — ETHANOL: Alcohol, Ethyl (B): <10 mg/dL (ref <10)

## 2019-02-14 LAB — LACTIC ACID, PLASMA
Lactic Acid, Venous: 7.7 mmol/L (ref 0.5–1.9)
Lactic Acid, Venous: 5.5 mmol/L (ref 0.5–1.9)

## 2019-02-14 SURGERY — INSERTION, PLEURAL DRAINAGE CATHETER
Anesthesia: Monitor Anesthesia Care | Laterality: Right

## 2019-02-14 MED ORDER — ALBUTEROL SULFATE (2.5 MG/3ML) 0.083% IN NEBU: 2.5000 mg | INHALATION_SOLUTION | RESPIRATORY_TRACT | Status: DC | PRN

## 2019-02-14 MED ORDER — ORAL CARE MOUTH RINSE
15.0000 mL | OROMUCOSAL | Status: DC
  Administered 2019-02-14 – 2019-02-16 (×18): 15 mL via OROMUCOSAL

## 2019-02-14 MED ORDER — FENTANYL CITRATE (PF) 100 MCG/2ML IJ SOLN
25.0000 ug | INTRAMUSCULAR | Status: DC | PRN
  Administered 2019-02-14 (×4): 100 ug via INTRAVENOUS
  Filled 2019-02-14 (×4): qty 2

## 2019-02-14 MED ORDER — SODIUM CHLORIDE 0.9 % IV SOLN
1.0000 g | Freq: Three times a day (TID) | INTRAVENOUS | Status: DC
  Administered 2019-02-14 – 2019-02-15 (×4): 1 g via INTRAVENOUS
  Filled 2019-02-14 (×13): qty 1

## 2019-02-14 MED ORDER — SODIUM CHLORIDE 0.9 % IV BOLUS
500.0000 mL | Freq: Once | INTRAVENOUS | Status: AC
  Administered 2019-02-14: 500 mL via INTRAVENOUS

## 2019-02-14 MED ORDER — MIDAZOLAM HCL 2 MG/2ML IJ SOLN
1.0000 mg | INTRAMUSCULAR | Status: DC | PRN
  Administered 2019-02-14 (×2): 2 mg via INTRAVENOUS
  Filled 2019-02-14 (×2): qty 2

## 2019-02-14 MED ORDER — NOREPINEPHRINE 4 MG/250ML-% IV SOLN
0.0000 ug/min | INTRAVENOUS | Status: DC
  Administered 2019-02-14: 18 ug/min via INTRAVENOUS
  Administered 2019-02-14 (×2): 2 ug/min via INTRAVENOUS
  Administered 2019-02-15 (×2): 18 ug/min via INTRAVENOUS
  Administered 2019-02-15: 12:00:00 5 ug/min via INTRAVENOUS
  Filled 2019-02-14 (×7): qty 250

## 2019-02-14 MED ORDER — HEPARIN SODIUM (PORCINE) 5000 UNIT/ML IJ SOLN
5000.0000 [IU] | Freq: Three times a day (TID) | INTRAMUSCULAR | Status: DC
  Administered 2019-02-14: 5000 [IU] via SUBCUTANEOUS
  Filled 2019-02-14: qty 1

## 2019-02-14 MED ORDER — SODIUM CHLORIDE 0.9 % IV SOLN
INTRAVENOUS | Status: DC
  Administered 2019-02-14: 125 mL/h via INTRAVENOUS
  Administered 2019-02-14: 22:00:00 500 mL via INTRAVENOUS
  Administered 2019-02-15 (×2): via INTRAVENOUS

## 2019-02-14 MED ORDER — EPINEPHRINE 1 MG/10ML IJ SOSY
PREFILLED_SYRINGE | INTRAMUSCULAR | Status: AC | PRN
  Administered 2019-02-14 (×3): 1 via INTRAVENOUS

## 2019-02-14 MED ORDER — FENTANYL CITRATE (PF) 100 MCG/2ML IJ SOLN
INTRAMUSCULAR | Status: AC
  Administered 2019-02-14: 08:00:00
  Filled 2019-02-14: qty 2

## 2019-02-14 MED ORDER — FAMOTIDINE 40 MG/5ML PO SUSR
20.0000 mg | Freq: Two times a day (BID) | ORAL | Status: DC
  Administered 2019-02-14 (×2): 20 mg
  Filled 2019-02-14 (×2): qty 2.5

## 2019-02-14 MED ORDER — SODIUM CHLORIDE 0.9 % IV BOLUS (SEPSIS)
1000.0000 mL | Freq: Once | INTRAVENOUS | Status: AC
  Administered 2019-02-14: 1000 mL via INTRAVENOUS

## 2019-02-14 MED ORDER — SODIUM CHLORIDE 0.9 % IV BOLUS
1000.0000 mL | Freq: Once | INTRAVENOUS | Status: AC
  Administered 2019-02-14: 1000 mL via INTRAVENOUS

## 2019-02-14 MED ORDER — CHLORHEXIDINE GLUCONATE 0.12% ORAL RINSE (MEDLINE KIT)
15.0000 mL | Freq: Two times a day (BID) | OROMUCOSAL | Status: DC
  Administered 2019-02-14 – 2019-02-15 (×4): 15 mL via OROMUCOSAL

## 2019-02-14 MED ORDER — VALPROATE SODIUM 500 MG/5ML IV SOLN
500.0000 mg | Freq: Two times a day (BID) | INTRAVENOUS | Status: DC
  Administered 2019-02-14 – 2019-02-15 (×4): 500 mg via INTRAVENOUS
  Filled 2019-02-14 (×7): qty 5

## 2019-02-14 MED ORDER — SODIUM BICARBONATE 8.4 % IV SOLN
INTRAVENOUS | Status: AC | PRN
  Administered 2019-02-14 (×2): 50 meq via INTRAVENOUS

## 2019-02-14 MED ORDER — IPRATROPIUM-ALBUTEROL 0.5-2.5 (3) MG/3ML IN SOLN
3.0000 mL | Freq: Four times a day (QID) | RESPIRATORY_TRACT | Status: DC
  Administered 2019-02-14 – 2019-02-15 (×6): 3 mL via RESPIRATORY_TRACT
  Filled 2019-02-14 (×6): qty 3

## 2019-02-14 MED ORDER — ENOXAPARIN SODIUM 40 MG/0.4ML ~~LOC~~ SOLN
40.0000 mg | SUBCUTANEOUS | Status: DC
  Administered 2019-02-14: 40 mg via SUBCUTANEOUS
  Filled 2019-02-14: qty 0.4

## 2019-02-14 MED ORDER — PROPOFOL 1000 MG/100ML IV EMUL
5.0000 ug/kg/min | INTRAVENOUS | Status: DC
  Administered 2019-02-14: 20:00:00 50 ug/kg/min via INTRAVENOUS
  Administered 2019-02-14: 15 ug/kg/min via INTRAVENOUS
  Administered 2019-02-14: 50 ug/kg/min via INTRAVENOUS
  Administered 2019-02-15: 25 ug/kg/min via INTRAVENOUS
  Administered 2019-02-15: 03:00:00 50 ug/kg/min via INTRAVENOUS
  Filled 2019-02-14 (×5): qty 100

## 2019-02-14 MED ORDER — FENTANYL CITRATE (PF) 100 MCG/2ML IJ SOLN
100.0000 ug | INTRAMUSCULAR | Status: DC | PRN
  Administered 2019-02-14: 100 ug via INTRAVENOUS

## 2019-02-14 MED FILL — Medication: Qty: 1 | Status: AC

## 2019-02-14 NOTE — ED Notes (Signed)
Thoracic surgeon at bedside

## 2019-02-14 NOTE — ED Notes (Signed)
Wife in consultation room

## 2019-02-14 NOTE — Progress Notes (Signed)
Patient admitted to 2H18.  Ventilator at 100% FiO2.  Decorticate posturing noted.  Levophed infusing at 20 mcg/min.

## 2019-02-14 NOTE — ED Notes (Signed)
Levophed moved from #18 L AC to R Femoral central liner per verbal from [place], MD

## 2019-02-14 NOTE — Progress Notes (Signed)
Chaplain responded to call from ED bridge at 6:20 AM  Patient entered ER via ambulance entrance.  Wife was driving him to procedure here at [facility] for lung cancer when in route, patient experienced cardiac arrest.  Patient in Trauma A.  Provided ministry of presence and prayer as staff worked on patient.  Continued presence as pt's sister and mother arrived.  All gathered in Consult A.   Am continuing to be present with family as they await doctor's report of pt's status.   Rev. Tamsen Snider Pager [phone]

## 2019-02-14 NOTE — ED Triage Notes (Signed)
Patient driven to ED by wife.  Patient pulled from car at ambulance bay in cardiac arrest.  Patient has history of Lung CA.  He was supposed to have some fluid pulled off today and he stopped breathing on the way to hospital.

## 2019-02-14 NOTE — Progress Notes (Signed)
Assisted tele visit to patient with daughter and son.  Anthony Moon, Aliene Beams, RN

## 2019-02-14 NOTE — ED Notes (Signed)
ETT/OGT intact , IV and intraosseous acces intact , sinus tach on the monitor , O2 sat= 100% , portable chest x-ray completed post intubation , decompression needle at left upper chest intact with light brown malodorous exudate .

## 2019-02-14 NOTE — H&P (Addendum)
NAME:  Anthony Moon, MRN:  [id number], DOB:  1966/04/02, LOS: 0 ADMISSION DATE:  03/07/2019, CONSULTATION DATE:  03/13/2019 REFERRING MD:  Dr. Leonides Schanz, CHIEF COMPLAINT:  Cardiac Arrest   Brief History   53 y/o M with adenocarcinoma of lung with metastatic pleural effusions who presented to [facility] ER 5/1 in cardiac arrest. The patient was en route to [facility] for pleurX catheter placement when he became unresponsive.  Approximately 10 minutes downtime before ROSC.  Intubated on arrival. Chest tube placed in ER with large volume brown fluid with strong odor removed.    History of present illness   52 y/o M with adenocarcinoma of lung with metastatic pleural effusions who presented to [facility] ER 5/1 in cardiac arrest. The patient was en route to [facility] for pleurX catheter placement when he became unresponsive.  Approximately 10 minutes downtime before ROSC.  He was intubated for AMS / cardiac arrest.  Chest tube placed for large pleural effusion with 2L+ brown fluid with strong odor removed.  Initial labs notable for Na 120, K 5.6, CL 83, CO2 17, BUN 16, sr cr 0.96, glucose 127, WBC 44, hgb  13.9, platelets 653, alk phos 303, troponin 0.06, lactic acid 7.7.  The patient was given 4L IVF and started on levophed for hypotension.  Wife reports foul odor from patients mouth for the last week.    Past Medical History  Adenocarcinoma of lung with metastatic pleural effusion CAD s/p DES to LAD NICM  HTN GERD Bilateral inguinal hernia  Tobacco Abuse - quit 12/2018, 43 years 1ppd ETOH Abuse - quit   [facility] Events   5/01 Admit post arrest  Consults:    Procedures:  ETT 5/1 >>  R Fem TLC 5/1 >>   Significant Diagnostic Tests:    Micro Data:  Pleural fluid culture 5/1 >>  BCx2 5/1 >>  Tracheal aspirate 5/1 >>  UA 5/1 >>   Antimicrobials:  Merropenem 5/1 >>   Interim history/subjective:  As above  Objective   Blood pressure (!) 69/30, pulse 80, temperature 98.6 F (37 C), temperature source  Rectal, resp. rate (!) 25, SpO2 100 %.    Vent Mode: PRVC FiO2 (%):  [100 %] 100 % Set Rate:  [18 bmp-26 bmp] 24 bmp Vt Set:  [500 mL-580 mL] 580 mL PEEP:  [5 cmH20-10 cmH20] 5 cmH20 Plateau Pressure:  [23 cmH20-24 cmH20] 24 cmH20   Intake/Output Summary (Last 24 hours) at 02/20/2019 0830 Last data filed at 02/20/2019 6438 Gross per 24 hour  Intake 4000 ml  Output -  Net 4000 ml   There were no vitals filed for this visit.  Examination: General: chronically ill appearing adult male lying on stretcher on vent  HEENT: MM pale, dry, ETT Neuro: obtunded, upward gaze, pupils 6m sluggish CV: s1s2 rrr, no m/r/g, SR on monitor with frequent PVC's PULM: agonal type respirations despite vent, clear on L, mildly diminished on R GI: soft, bsx4 hypoactive  Extremities: warm/dry, BLE 2+ pitting edema  Skin: no rashes or lesions  Resolved Hospital Problem list      Assessment & Plan:   Cardiac Arrest  -unknown initial rhythm, approx 10 minutes downtime P: Admit to ICU for monitoring  Levophed for MAP >65 NS at 125 ml/hr  Not a candidate for TTM due to advanced lung cancer Assess CT head   Acute Respiratory Insufficiency  -post arrest, suspect related to large effusion, advanced lung cancer Tobacco Abuse  -quit smoking at diagnosis of lung cancer  P: PRVC 8cc/kg Wean PEEP / FiO2 for sats 88-95% Advance ETT 2 cm  Follow CXR intermittently  ABG in one hour  Assess tracheal aspirate   Large Pleural Effusion (R) Metastatic Adenocarcinoma of Lung -initial dx by Dr. Melvyn Novas 4/22 by FOB, RUL, adenopathy, adrenal nodules, pleural metastatic disease  P: CVTS consulted for evaluation, appreciate input  Assess pleural fluid for culture, GS, LDH Hold home oral narcotics for now, PRN fentanyl for pain  Meropenem as above  Acute Metabolic Acidosis  At Risk AKI  P: Trend BMP / urinary output Replace electrolytes as indicated Avoid nephrotoxic agents, ensure adequate renal perfusion   Malnutrition  -albumin 1.2 on admit, 25lb wt loss in last 2 months  P: Consider TF in am if remains intubated   Thrombocytosis  P: Follow CBC   HTN, CAD with DES P: Hold home antihypertensives  Consider restart plavix for DES in am 5/2   Best practice:  Diet: NPO Pain/Anxiety/Delirium protocol (if indicated): PRN fentanyl VAP protocol (if indicated): in place  DVT prophylaxis: heparin  GI prophylaxis: pepcid  Glucose control: n/a  Mobility: bedrest  Code Status: DNR  Family Communication: Wife updated per Dr. Alva Garnet at length.  Continue current efforts for now, DNR in the event of arrest.  Disposition: ICU  Labs   CBC: Recent Labs  Lab 02/13/19 1504  WBC 44.0*  NEUTROABS 38.9*  HGB 13.9  HCT 41.4  MCV 88.3  PLT 653*    Basic Metabolic Panel: Recent Labs  Lab 02/13/19 1504 02/18/2019 0651  NA 122* 120*  K 5.5* 5.6*  CL 84* 83*  CO2 26 17*  GLUCOSE 121* 127*  BUN 16 16  CREATININE 0.64 0.96  CALCIUM 8.3* 7.8*   GFR: Estimated Creatinine Clearance: 89.2 mL/min (by C-G formula based on SCr of 0.96 mg/dL). Recent Labs  Lab 02/13/19 1504 03/04/2019 0651  WBC 44.0*  --   LATICACIDVEN  --  7.7*    Liver Function Tests: Recent Labs  Lab 02/13/19 1504 03/12/2019 0651  AST 32 39  ALT 34 29  ALKPHOS 504* 303*  BILITOT 0.5 0.6  PROT 6.4* 4.2*  ALBUMIN 1.7* 1.2*   No results for input(s): LIPASE, AMYLASE in the last 168 hours. No results for input(s): AMMONIA in the last 168 hours.  ABG    Component Value Date/Time   PHART 6.958 (LL) 02/25/2019 0645   PCO2ART 94.7 (HH) 02/28/2019 0645   PO2ART 193 (H) 02/21/2019 0645   HCO3 20.2 03/04/2019 0645   TCO2 25 05/18/2010 0813   ACIDBASEDEF 10.4 (H) 03/16/2019 0645   O2SAT 96.7 03/15/2019 0645     Coagulation Profile: Recent Labs  Lab 02/25/2019 0651  INR 1.5*    Cardiac Enzymes: Recent Labs  Lab 03/13/2019 0651  TROPONINI 0.06*    HbA1C: Hgb A1c MFr Bld  Date/Time Value Ref Range Status   05/18/2010 12:11 PM  <5.7 % Final   5.5 (NOTE)                                                                       According to the ADA Clinical Practice Recommendations for 2011, when HbA1c is used as a screening test:   >=6.5%   Diagnostic of Diabetes Mellitus           (  if abnormal result  is confirmed)  5.7-6.4%   Increased risk of developing Diabetes Mellitus  References:Diagnosis and Classification of Diabetes Mellitus,Diabetes GURK,2706,23(JSEGB 1):S62-S69 and Standards of Medical Care in         Diabetes - 2011,Diabetes TDVV,6160,73  (Suppl 1):S11-S61.    CBG: Recent Labs  Lab 02/11/19 0957 03/01/2019 0620  GLUCAP 116* 84    Review of Systems:   Unable to complete as patient altered on mechanical ventilation.   Past Medical History  He,  has a past medical history of Bilateral recurrent inguinal hernia, CAD (coronary artery disease) (cardiologist-  dr Angelena Form), Cancer [facility]), Edema, GERD (gastroesophageal reflux disease), History of non-ST elevation myocardial infarction (NSTEMI), History of pericarditis, Hypertension, Malignant pleural effusion, Myocardial infarction ([place]), Nonischemic cardiomyopathy ([place]), Rash, S/P drug eluting coronary stent placement, Seasonal allergies, Smokers' cough ([place]), and Wears glasses.   Surgical History    Past Surgical History:  Procedure Laterality Date  . CORONARY ANGIOPLASTY WITH STENT PLACEMENT  05-18-2010   dr Angelena Form   PCI and DES x2 to mLAD and pLAD (not overlapping)/  hypokinesis of anterolateral wall and apex w/ ef 50%/  mild nonobstuctive cad in RCA and CFX  . INGUINAL HERNIA REPAIR Bilateral age 89  . INGUINAL HERNIA REPAIR Bilateral 12/21/2015   Procedure: LAPAROSCOPIC BILATERAL INGUINAL HERNIA REPAIR W/MESH ;  Surgeon: Arta Bruce Kinsinger, MD;  Location: [place];  Service: General;  Laterality: Bilateral;  . IR THORACENTESIS ASP PLEURAL SPACE W/IMG GUIDE  01/22/2019  . IR THORACENTESIS ASP PLEURAL SPACE W/IMG GUIDE   01/29/2019  . MANDIBLE FRACTURE SURGERY Left 1990's  . TRANSTHORACIC ECHOCARDIOGRAM  05-18-2010   septal, apical, and inferoapical hypokinesis,  ef 40-45%/  trivial MR/  mild TR  . VIDEO BRONCHOSCOPY Bilateral 02/05/2019   Procedure: VIDEO BRONCHOSCOPY WITHOUT FLUORO;  Surgeon: Tanda Rockers, MD;  Location: [facility];  Service: Cardiopulmonary;  Laterality: Bilateral;     Social History   reports that he quit smoking about 2 months ago. His smoking use included cigarettes. He has a 40.00 pack-year smoking history. He has never used smokeless tobacco. He reports previous alcohol use of about 14.0 standard drinks of alcohol per week. He reports previous drug use. Drug: Marijuana.   Family History   His family history includes Coronary artery disease in his father. There is no history of Lung disease.   Allergies No Known Allergies   Home Medications  Prior to Admission medications   Medication Sig Start Date End Date Taking? Authorizing Provider  azelastine (ASTELIN) 0.1 % nasal spray Place 1 spray into both nostrils 2 (two) times daily as needed for rhinitis. Use in each nostril as directed   Yes [provider]  cloNIDine (CATAPRES) 0.2 MG tablet Take 1 tablet (0.2 mg total) by mouth 2 (two) times daily. 02/05/19  Yes Tanda Rockers, MD  Cyanocobalamin (VITAMIN B-12 IJ) Inject 1 Dose into the muscle every 30 (thirty) days.   Yes [provider]  fentaNYL (DURAGESIC) 50 MCG/HR Place 1 patch onto the skin every 3 (three) days. 02/05/19  Yes Tanda Rockers, MD  HYDROcodone-homatropine [facility]) 5-1.5 MG/5ML syrup Take 5 mLs by mouth every 6 (six) hours as needed for cough. 02/13/19  Yes Curt Bears, MD  metoprolol tartrate (LOPRESSOR) 50 MG tablet Take 50 mg by mouth 2 (two) times daily.   Yes [provider]  nicotine (NICODERM CQ - DOSED IN MG/24 HOURS) 21 mg/24hr patch Place 21 mg onto the skin daily.  Yes [provider]  nitroGLYCERIN  (NITROSTAT) 0.4 MG SL tablet Place 1 tablet (0.4 mg total) under the tongue every 5 (five) minutes as needed. 12/09/18  Yes Burnell Blanks, MD  omeprazole (PRILOSEC) 20 MG capsule Take 40 mg by mouth daily.   Yes [provider]  oxyCODONE (ROXICODONE) 5 MG immediate release tablet 1 up to every 3 hours as needed for breakthru pain once duragesic applied Patient taking differently: Take 5 mg by mouth every 3 (three) hours as needed for moderate pain.  02/05/19  Yes Tanda Rockers, MD  simvastatin (ZOCOR) 40 MG tablet Take 1 tablet (40 mg total) by mouth at bedtime. Patient taking differently: Take 40 mg by mouth daily.  12/09/18  Yes Burnell Blanks, MD  aspirin 81 MG tablet Take 1 tablet (81 mg total) by mouth daily. 01/16/11   Burnell Blanks, MD  clopidogrel (PLAVIX) 75 MG tablet Take 1 tablet (75 mg total) by mouth daily. 12/09/18   Burnell Blanks, MD  folic acid (FOLVITE) 1 MG tablet Take 1 tablet (1 mg total) by mouth daily. 02/13/19   Curt Bears, MD  furosemide (LASIX) 20 MG tablet 1 tablet p.o. daily as needed for the swelling of the legs. Patient taking differently: Take 20 mg by mouth daily as needed for edema.  02/13/19   Curt Bears, MD  levofloxacin (LEVAQUIN) 500 MG tablet Take 1 tablet (500 mg total) by mouth daily. 02/13/19   Curt Bears, MD  prochlorperazine (COMPAZINE) 10 MG tablet Take 1 tablet (10 mg total) by mouth every 6 (six) hours as needed for nausea or vomiting. 02/13/19   Curt Bears, MD     Critical care time: 7 minutes    Noe Gens, NP-C [facility] Pgr: [phone] or if no answer [phone] 02/18/2019, 8:30 AM

## 2019-02-14 NOTE — Code Documentation (Signed)
Ongoing CPR

## 2019-02-14 NOTE — ED Provider Notes (Signed)
TIME SEEN: 6:47 AM  CHIEF COMPLAINT: Cardiac arrest  HPI: Patient is a 53 year old male with history of smoking, stage IV lung cancer, malignant right pleural effusion (s.p thoracentesis 01/22/19, 01/29/19, 02/11/19), HTN, CAD (NSTEMI 05/18/10, s/p DES LAD x2), cardiomyopathy (mild ischemic CM, EF 45-50% 05/2010; > 55% 12/2016 echo) who presents to the emergency department in cardiac arrest.  Patient's wife provides most of the history.  She states that he is receiving chemotherapy for lung cancer and has had recurrent pleural effusion on the right side.  Was scheduled to have a Pleurx catheter today by CT surgery Dr. Roxan Hockey.  They were on their way from the house to preop when the patient became short of breath.  She states he then slumped over and was unresponsive for approximately 5 to 10 minutes while she rushed to the emergency department.  Patient's wife pulled into the ambulance bay.  Patient was pulseless, apneic at that time.  ROS: level V caveat for cardiac arrest  PAST MEDICAL HISTORY/PAST SURGICAL HISTORY:  Past Medical History:  Diagnosis Date  . Bilateral recurrent inguinal hernia   . CAD (coronary artery disease) cardiologist-  dr Angelena Form   NSTEMI--- s/p cath 05/18/2010 DES  x2  to mLAD and pLAD not overlapping  . Cancer ([place])    lung cancer  . Edema    B/LLE  . GERD (gastroesophageal reflux disease)   . History of non-ST elevation myocardial infarction (NSTEMI)    05-18-2010  s/p  PCI and DES x2 to LAD  . History of pericarditis    12-23-2010  resolved  . Hypertension   . Malignant pleural effusion    right  . Myocardial infarction ([place])   . Nonischemic cardiomyopathy ([place])    last ef 40-45% per echo 2011  . Rash    mid chest  . S/P drug eluting coronary stent placement    05-18-2010  to mLAD and pLAD  . Seasonal allergies   . Smokers' cough ([place])   . Wears glasses     MEDICATIONS:  Prior to Admission medications   Medication Sig Start Date End Date Taking?  Authorizing Provider  aspirin 81 MG tablet Take 1 tablet (81 mg total) by mouth daily. 01/16/11   Burnell Blanks, MD  azelastine (ASTELIN) 0.1 % nasal spray Place 1 spray into both nostrils 2 (two) times daily as needed for rhinitis. Use in each nostril as directed    [provider]  cloNIDine (CATAPRES) 0.2 MG tablet Take 1 tablet (0.2 mg total) by mouth 2 (two) times daily. 02/05/19   Tanda Rockers, MD  clopidogrel (PLAVIX) 75 MG tablet Take 1 tablet (75 mg total) by mouth daily. 12/09/18   Burnell Blanks, MD  fentaNYL (DURAGESIC) 50 MCG/HR Place 1 patch onto the skin every 3 (three) days. 02/05/19   Tanda Rockers, MD  folic acid (FOLVITE) 1 MG tablet Take 1 tablet (1 mg total) by mouth daily. 02/13/19   Curt Bears, MD  furosemide (LASIX) 20 MG tablet 1 tablet p.o. daily as needed for the swelling of the legs. 02/13/19   Curt Bears, MD  HYDROcodone-homatropine [facility]) 5-1.5 MG/5ML syrup Take 5 mLs by mouth every 6 (six) hours as needed for cough. 02/13/19   Curt Bears, MD  levofloxacin (LEVAQUIN) 500 MG tablet Take 1 tablet (500 mg total) by mouth daily. 02/13/19   Curt Bears, MD  nicotine (NICODERM CQ - DOSED IN MG/24 HOURS) 21 mg/24hr patch Place 21 mg onto the skin daily.  [provider]  nitroGLYCERIN (NITROSTAT) 0.4 MG SL tablet Place 1 tablet (0.4 mg total) under the tongue every 5 (five) minutes as needed. Patient not taking: Reported on 02/13/2019 12/09/18   Burnell Blanks, MD  omeprazole (PRILOSEC) 20 MG capsule Take 40 mg by mouth daily.    [provider]  oxyCODONE (ROXICODONE) 5 MG immediate release tablet 1 up to every 3 hours as needed for breakthru pain once duragesic applied Patient taking differently: Take 5 mg by mouth every 3 (three) hours as needed for moderate pain.  02/05/19   Tanda Rockers, MD  prochlorperazine (COMPAZINE) 10 MG tablet Take 1 tablet (10 mg total) by mouth every 6 (six) hours as  needed for nausea or vomiting. 02/13/19   Curt Bears, MD  simvastatin (ZOCOR) 40 MG tablet Take 1 tablet (40 mg total) by mouth at bedtime. Patient taking differently: Take 40 mg by mouth daily.  12/09/18   Burnell Blanks, MD    ALLERGIES:  No Known Allergies  SOCIAL HISTORY:  Social History   Tobacco Use  . Smoking status: Former Smoker    Packs/day: 1.00    Years: 40.00    Pack years: 40.00    Types: Cigarettes    Last attempt to quit: 12/15/2018    Years since quitting: 0.1  . Smokeless tobacco: Never Used  Substance Use Topics  . Alcohol use: Not Currently    Alcohol/week: 14.0 standard drinks    Types: 14 Cans of beer per week    Comment: 6 beers    FAMILY HISTORY: Family History  Problem Relation Age of Onset  . Coronary artery disease Father   . Lung disease Neg Hx     EXAM: BP (!) 91/58   Pulse 100   Temp 98.6 F (37 C) (Rectal)   Resp (!) 22   SpO2 98%  CONSTITUTIONAL: GCS 3, patient pulseless and apneic HEAD: Normocephalic, atraumatic EYES: Conjunctivae clear, pupils approximately 6 mm and unresponsive ENT: normal nose; moist mucous membranes NECK: Supple CARD: Patient pulseless RESP: Patient is apneic and cyanotic ABD/GI: Soft and nondistended BACK:  The back appears normal EXT: No edema, no major deformity SKIN: Cyanotic, cool to touch NEURO: GCS 3, no cough, no gag, no corneal reflex   MEDICAL DECISION MAKING: Patient here after cardiac arrest.  Wife reports that patient became unresponsive of approximately 5 to 10 minutes prior to her arriving in the ambulance bay.  Brought in with EMT performing CPR.  IO established in the right tibia.  Given epinephrine, bicarb.  Rhythm at that point was checked and showed ventricular fibrillation.  Patient was defibrillated.  Patient was then given another round of epinephrine and bicarb.  Patient was in PEA.  Given a third epinephrine and then ROSC after approximately 10-15 minutes of CPR and patient  was in sinus tachycardia.  EKG did show slight ST elevation in inferior leads without reciprocal change.  Blood sugar normal.  Intubated during resuscitation.  Patient had right-sided JVD, possible tracheal deviation, no lung movement on bedside ultrasound on the right side so decision was made to needle decompress for possible tension pneumothorax.  Patient has an area and foul-smelling fluid coming from the Angiocath in his chest.  ED PROGRESS: Patient's tachycardia and blood pressure improved.  Satting 100% on vent.  Blood gas shows pH of 6.9 with a PCO2 of 95.  Rate was increased from 18 to 26.   7:13 AM  D/w Dr. Prescott Gum with CT  surgery.  They will see patient in consult.  Patient was scheduled to have a Pleurx catheter with Dr. Roxan Hockey today.    Patient began to be hypotensive.  Chest x-ray was concerning for mediastinal shift from his malignant effusion.  Decision was made to place right femoral central line to give pressors and chest tube due to concerns for tension physiology.   Patient's wife has been updated.  She states he is on chemotherapy which is palliative in nature but at this time they would like to keep patient a full code.  Patient now breathing over the vent intermittently, coughing.  Pupils are 3 to 4 mm and reactive bilaterally.  Will give PRN fentanyl for sedation.  He is not moving his extremities, opening his eyes or following commands.   Critical care to admit patient.   Labs show sodium of 120, testing of 5.6.  Lactate elevated at 7.  Troponin elevated in the setting of CPR.  Repeat chest x-ray shows evacuation of right pleural fluid but no predominant gas in the right pleural space.  Mediastinal shift has resolved.  No significant reexpansion of the right lung compatible with chronic consolidation and malignancy.    EKG Interpretation  Date/Time:  Friday Feb 14 2019 06:28:59 EDT Ventricular Rate:  133 PR Interval:    QRS Duration: 103 QT  Interval:  267 QTC Calculation: 398 R Axis:   87 Text Interpretation:  Sinus tachycardia Anterior infarct, acute (LAD) ST elevation, consider inferior injury but likely rate related no reciprocal change Confirmed by Pryor Curia [phone]) on 03/15/2019 8:38:23 AM        CRITICAL CARE Performed by: Cyril Mourning Shree Espey   Total critical care time: 75 minutes  Critical care time was exclusive of separately billable procedures and treating other patients.  Critical care was necessary to treat or prevent imminent or life-threatening deterioration.  Critical care was time spent personally by me on the following activities: development of treatment plan with patient and/or surrogate as well as nursing, discussions with consultants, evaluation of patient's response to treatment, examination of patient, obtaining history from patient or surrogate, ordering and performing treatments and interventions, ordering and review of laboratory studies, ordering and review of radiographic studies, pulse oximetry and re-evaluation of patient's condition.   Cardiopulmonary Resuscitation (CPR) Procedure Note Directed/Performed by: Pryor Curia I personally directed ancillary staff and/or performed CPR in an effort to regain return of spontaneous circulation and to maintain cardiac, neuro and systemic perfusion.    Procedure Name: Intubation Performed by: Masoud Nyce, Delice Bison, DO Pre-anesthesia Checklist: Patient identified, Patient being monitored, Emergency Drugs available, Timeout performed and Suction available Oxygen Delivery Method: Ambu bag Preoxygenation: Pre-oxygenation with 100% oxygen Ventilation: Mask ventilation without difficulty Laryngoscope Size: Glidescope and 3 Grade View: Grade II Tube size: 7.5 mm Number of attempts: 1 Placement Confirmation: ETT inserted through vocal cords under direct vision,  CO2 detector and Breath sounds checked- equal and bilateral Secured at: 25 cm Tube secured with: ETT  holder    .Central Line Performed by: Contrell Ballentine, Delice Bison, DO Authorized by: Lin Hackmann, Delice Bison, DO   Consent:    Consent obtained:  Emergent situation Pre-procedure details:    Hand hygiene: Hand hygiene performed prior to insertion     Sterile barrier technique: All elements of maximal sterile technique followed     Skin preparation:  ChloraPrep   Skin preparation agent: Skin preparation agent completely dried prior to procedure   Anesthesia (see MAR for exact dosages):    Anesthesia  method:  None Procedure details:    Location:  R femoral   Site selection rationale:  Emergent   Procedural supplies:  Triple lumen   Landmarks identified: yes     Ultrasound guidance: no     Number of attempts:  1   Successful placement: yes   Post-procedure details:    Post-procedure:  Dressing applied and line sutured   Assessment:  Blood return through all ports and free fluid flow   Patient tolerance of procedure:  Tolerated well, no immediate complications CHEST TUBE INSERTION Date/Time: 02/17/2019 8:41 AM Performed by: Shantera Monts, Delice Bison, DO Authorized by: Shronda Boeh, Delice Bison, DO   Consent:    Consent obtained:  Emergent situation Pre-procedure details:    Skin preparation:  ChloraPrep   Preparation: Patient was prepped and draped in the usual sterile fashion   Anesthesia (see MAR for exact dosages):    Anesthesia method:  None Procedure details:    Placement location:  R lateral   Scalpel size:  11   Tube size (French): pigtail.   Ultrasound guidance: no     Tube connected to:  Suction   Drainage characteristics: foul smelling, brown.   Suture material:  0 silk   Dressing:  4x4 sterile gauze Post-procedure details:    Post-insertion x-ray findings: tube in good position     Patient tolerance of procedure:  Tolerated well, no immediate complications       Tareka Jhaveri, Delice Bison, DO 03/02/2019 [phone]

## 2019-02-14 NOTE — Progress Notes (Signed)
Asked to see patient with malignant r sided effusion secondary to metastatic adenoca of the lung who had an arrest earlier today.  Pig tail was placed and patient had 2 liters of foul smelling black fluid removed without reexpansion of the R lung or at least the RLL consistent with some degree of trapped lung.  Current neuro exam is poor without oculogyric reflex, no corneals and very sluggish pupils to light.   Essentially ask to evaluate for variable BP and tachycardia.  On levophed with BP 90 systolic. Will give fluid bolus and continue current therapy.  Discussed at length with his wife Anthony Moon.  She understands that he is terminal and prognosis is extremely poor. There is little to offer.   35 minutes spent in critical care evaluation and chart review.

## 2019-02-14 NOTE — Progress Notes (Signed)
EEG Complete, Results Pending

## 2019-02-14 NOTE — ED Notes (Signed)
Ward, MD aware of troponin and lactic acid results

## 2019-02-14 NOTE — Progress Notes (Signed)
Day of Surgery Procedure(s) (LRB): INSERTION PLEURAL DRAINAGE CATHETER (Right) Subjective: Asked to see patient in ED after he presented with PEA arrest while traveling to the hospital for placement of a right Pleurx catheter for malignant effusion this morning by Dr. Roxan Hockey.  Patient examined in ED, most recent x-ray images before and after placement of right pigtail catheter by Dr. Leonides Schanz and previous chest CT scan images personally reviewed.  Patient discussed with  Cherlynn Kaiser, NP for coordination of care.  Patient currently unresponsive on ventilator.  Over 2 L of dark foul-smelling fluid has been removed by the pigtail catheter placed in the right pleural space by Dr. Leonides Schanz.  Post pigtail placement shows consolidation-collapse of the right lung due to tumor with the pleural space now drained and the pigtail catheter in appropriate position.  The patient is on high-dose Levophed and has significant respiratory acidosis.  I would not recommend further chest tube placement for the patient as the pigtail catheter appears to be draining the effusion.  The patient certainly does not need Pleurx catheter placement at this point but would leave pigtail in place until decisions regarding goals of care have been made by family.   Objective: Vital signs in last 24 hours: Temp:  [97.6 F (36.4 C)-98.6 F (37 C)] 98.6 F (37 C) (05/01 0652) Pulse Rate:  [35-124] 73 (05/01 0830) Cardiac Rhythm: Sinus tachycardia (05/01 0632) Resp:  [16-26] 26 (05/01 0830) BP: (57-169)/(30-107) 112/72 (05/01 0830) SpO2:  [69 %-100 %] 100 % (05/01 0830) FiO2 (%):  [100 %] 100 % (05/01 0817) Weight:  [71.7 kg] 71.7 kg (04/30 1354)  Hemodynamic parameters for last 24 hours:    Intake/Output from previous day: No intake/output data recorded. Intake/Output this shift: Total I/O In: 4000 [I.V.:2000; Other:2000] Out: -   Exam  Middle-age male intubated unresponsive in ED major trauma room Breath sounds  diminished on right side with tubular character Heart rate regular Right pigtail chest tube catheter dressing and connections intact to Pleur-evac-second unit used.  Lab Results: Recent Labs    02/13/19 1504  WBC 44.0*  HGB 13.9  HCT 41.4  PLT 653*   BMET:  Recent Labs    02/13/19 1504 02/21/2019 0651  NA 122* 120*  K 5.5* 5.6*  CL 84* 83*  CO2 26 17*  GLUCOSE 121* 127*  BUN 16 16  CREATININE 0.64 0.96  CALCIUM 8.3* 7.8*    PT/INR:  Recent Labs    03/01/2019 0651  LABPROT 18.3*  INR 1.5*   ABG    Component Value Date/Time   PHART 6.958 (LL) 03/10/2019 0645   HCO3 20.2 03/11/2019 0645   TCO2 25 05/18/2010 0813   ACIDBASEDEF 10.4 (H) 03/13/2019 0645   O2SAT 96.7 02/27/2019 0645   CBG (last 3)  Recent Labs    02/11/19 0957 02/27/2019 0620  GLUCAP 116* 84    Assessment/Plan: S/P Procedure(s) (LRB): INSERTION PLEURAL DRAINAGE CATHETER (Right) No further chest tube insertion or Pleurx catheter indicated at this time. Will follow.   LOS: 0 days    Tharon Aquas Trigt III 03/02/2019

## 2019-02-14 NOTE — Progress Notes (Signed)
Called by RN to assess patient for rigid activity on exam.  Reported decorticate posturing.  No obvious seizure activity.  Patient medicated with versed / fentanyl prior to my arrival.  Pt resting in bed on vent, tachypnea but not labored.  Staff report he is more comfortable appearing / movement resolved.    Plan: Assess EEG Add valproic acid  Propofol for sedation / abnormal movements  Follow serial neuro exams, worrisome for poor prognosis   Noe Gens, NP-C [facility] Pgr: [phone] or if no answer [phone] 03/02/2019, 3:48 PM

## 2019-02-14 NOTE — ED Notes (Signed)
Pigtail Chest tube insertion complete to R chest, pt tolerated well, sterile procedure completed

## 2019-02-14 NOTE — Progress Notes (Signed)
CDS called with referral. [phone]. Call back with TOD.

## 2019-02-14 NOTE — Progress Notes (Signed)
Pharmacy Antibiotic Note  Anthony Moon is a 52 y.o. male admitted on 03/12/2019 with cardiac arrest.  Pharmacy has been consulted for meropenem dosing. Pt afebrile and WBC is elevated. Lactic acid is elevated as expected.   Plan: Meropenem 1gm IV Q8H  F/u renal fxn, C&S, clinical status    Temp (24hrs), Avg:98.1 F (36.7 C), Min:97.6 F (36.4 C), Max:98.6 F (37 C)  Recent Labs  Lab 02/13/19 1504 03/03/2019 0651  WBC 44.0*  --   CREATININE 0.64 0.96  LATICACIDVEN  --  7.7*    Estimated Creatinine Clearance: 89.2 mL/min (by C-G formula based on SCr of 0.96 mg/dL).    No Known Allergies  Antimicrobials this admission: Meropenem 5/1>>  Dose adjustments this admission: N/A  Microbiology results: Pending  Thank you for allowing pharmacy to be a part of this patient's care.  Anthony Moon, Anthony Moon 03/15/2019 8:36 AM

## 2019-02-14 NOTE — ED Notes (Signed)
Rosita Fire, MD requested RT wait to draw ABG, will touch base with RT in one hour

## 2019-02-14 NOTE — Procedures (Signed)
History: 53 yo M s/p cardiac arrest.   Sedation: Propofol 50 mcg/kg/min  Technique: This is a 21 channel routine scalp EEG performed at the bedside with bipolar and monopolar montages arranged in accordance to the international 10/20 system of electrode placement. One channel was dedicated to EKG recording.    Background: The background consists of low voltage generalized irregular delta interspersed with runs of generalized runs of 6 to 7 Hz theta with no evolution or epileptiform appearance.  Photic stimulation: Physiologic driving is not performed  EEG Abnormalities: 1) generalized irregular slow activity   Clinical Interpretation: This EEG is consistent with a generalized nonspecific cerebral dysfunction.  The appearance appears to be approaching a burst suppression pattern, however currently there are no true periods of suppression and the presence of sedating medication would confound any prognostic significance even if it did represent a true burst suppression.  At present, I would consider this appearance nonspecific.  There was no seizure or seizure predisposition recorded on this study. Please note that lack of epileptiform activity on EEG does not preclude the possibility of epilepsy.   Roland Rack, MD [facility] [phone]  If 7pm- 7am, please page neurology on call as listed in [place].

## 2019-02-14 NOTE — ED Provider Notes (Addendum)
I was assisting Dr. Leonides Schanz with cardiac arrest of this patient who required needle decompression for concern for tension PTX as he had distended JVD, tachycardic, with decreased lung sounds on right and no right sided chest rise with baging.   Needle decompression Date/Time: 03/06/2019 6:57 AM Performed by: Fatima Blank, MD Authorized by: Fatima Blank, MD  Consent: The procedure was performed in an emergent situation. Local anesthesia used: no  Anesthesia: Local anesthesia used: no  Sedation: Patient sedated: no       Return of mixture of fluid and air. Tachycardia and saturations improved.   Fatima Blank, MD 02/25/2019 [phone]

## 2019-02-14 NOTE — ED Provider Notes (Signed)
IO LINE INSERTION Date/Time: 02/19/2019 7:47 AM Performed by: Lorin Glass, PA-C Authorized by: Lorin Glass, PA-C   Consent:    Consent obtained:  Emergent situation Pre-procedure details:    Site preparation:  Alcohol Procedure details:    Insertion site:  R proximal tibia   Insertion device:  Drill device   Insertion: Needle was inserted through the bony cortex     Number of attempts:  1   Insertion confirmation:  Aspiration of blood/marrow, easy infusion of fluids and stability of the needle Post-procedure details:    Secured with:  Transparent dressing and protective shield   Patient tolerance of procedure:  Tolerated well, no immediate complications   I assisted Dr. Leonides Schanz in the treatment of this patient who presented in cardiopulmonary arrest.  Please see her note for full details.    Lorin Glass, [place] 03/01/2019 [phone]

## 2019-02-14 NOTE — Progress Notes (Signed)
Patient transported on vent to CT and to 1U27 without complications.  Vitals remained stable throughout.

## 2019-02-14 NOTE — Progress Notes (Signed)
Spoke with patients wife on phone.  Updated her on patients condition and questions answered.  She will be coming to hospital to pick up patients wallet and glasses which are sealed in a bag.

## 2019-02-14 NOTE — Progress Notes (Signed)
Initial Nutrition Assessment  DOCUMENTATION CODES:   Not applicable  INTERVENTION:   If pt unable to extubate in 24-48 hrs:   -Vital AF 1.2 @ 20 ml/hr via OGT -Increase by 10 ml Q6 hours to goal rate of 65 ml/hr (1560 ml)  -30 ml Prostat once daily  Provides: 1972 kcals, 132 grams protein, 1265 ml free water. Meets 104% of kcal needs and 100% of protein needs.   NUTRITION DIAGNOSIS:   Increased nutrient needs related to chronic illness, cancer and cancer related treatments as evidenced by estimated needs.  GOAL:   Provide needs based on ASPEN/SCCM guidelines  MONITOR:   Diet advancement, Weight trends, I & O's, Skin, Labs, Vent status, TF tolerance  REASON FOR ASSESSMENT:   Ventilator    ASSESSMENT:   Patient with PMH significant for adenocarcinoma of lung with metastatic PE, CAD s/p DES to LAD, HTN, GERD, and tobacco abuse. Presents this admission s/p cardiac arrest likely related to large effusion.    Pt discussed during ICU rounds and with RN.   Chest tube placed for large PE. TTM deferred given advanced lung cancer. Pt requiring pressor support. Per CCM, plan to start TF tomorrow if pt remains intubated.   Records indicate pt has maintained a weight of 155-160 lb since 06/04/18. Will need to obtain actual weight recording this admission and weight history to assess for weight loss.   Unable to perform NFPE as pt undergoing nursing care.   Patient is currently intubated on ventilator support MV: 16.2 L/min Temp (24hrs), Avg:97.8 F (36.6 C), Min:97 F (36.1 C), Max:98.6 F (37 C) BP: 135/87 MAP: 103  I/O: +4,000 ml  UOP: none recorded Chest tube: 2+ L brown fluid drained in ED  Drips: NS @ 125 ml/hr, levophed Labs: Na 120 (L) K 5.6 (H) calcium ionized 1.04 (L)   Diet Order:   Diet Order    None      EDUCATION NEEDS:   Not appropriate for education at this time  Skin:  Skin Assessment: Reviewed RN Assessment  Last BM:  PTA  Height:   Ht  Readings from Last 1 Encounters:  02/13/19 5\' 10"  (1.778 m)    Weight:   Wt Readings from Last 1 Encounters:  02/13/19 71.7 kg    Ideal Body Weight:  75.5 kg  BMI:  There is no height or weight on file to calculate BMI.  Estimated Nutritional Needs:   Kcal:  1903 kcal  Protein:  115-135 grams  Fluid:  >/= 1.9 L/day    Mariana Single RD, LDN Clinical Nutrition Pager # - [phone]

## 2019-02-15 ENCOUNTER — Inpatient Hospital Stay (HOSPITAL_COMMUNITY): Payer: Managed Care, Other (non HMO)

## 2019-02-15 DIAGNOSIS — A419 Sepsis, unspecified organism: Principal | ICD-10-CM

## 2019-02-15 DIAGNOSIS — J9601 Acute respiratory failure with hypoxia: Secondary | ICD-10-CM

## 2019-02-15 DIAGNOSIS — Z515 Encounter for palliative care: Secondary | ICD-10-CM

## 2019-02-15 MED ORDER — GLYCOPYRROLATE 1 MG PO TABS
1.0000 mg | ORAL_TABLET | ORAL | Status: DC | PRN
  Filled 2019-02-15: qty 1

## 2019-02-15 MED ORDER — MORPHINE BOLUS VIA INFUSION
5.0000 mg | INTRAVENOUS | Status: DC | PRN
  Administered 2019-02-15 – 2019-02-16 (×2): 5 mg via INTRAVENOUS
  Filled 2019-02-15: qty 5

## 2019-02-15 MED ORDER — DEXTROSE 5 % IV SOLN
INTRAVENOUS | Status: DC
  Administered 2019-02-15: 10:00:00 via INTRAVENOUS

## 2019-02-15 MED ORDER — DIPHENHYDRAMINE HCL 50 MG/ML IJ SOLN: 25.0000 mg | INTRAMUSCULAR | Status: DC | PRN

## 2019-02-15 MED ORDER — CLOPIDOGREL BISULFATE 75 MG PO TABS: 75.0000 mg | ORAL_TABLET | Freq: Every day | ORAL | Status: DC

## 2019-02-15 MED ORDER — GLYCOPYRROLATE 0.2 MG/ML IJ SOLN
0.2000 mg | INTRAMUSCULAR | Status: DC | PRN
  Administered 2019-02-16: 0.2 mg via INTRAVENOUS
  Filled 2019-02-15: qty 1

## 2019-02-15 MED ORDER — MORPHINE SULFATE (PF) 2 MG/ML IV SOLN: 2.0000 mg | INTRAVENOUS | Status: DC | PRN

## 2019-02-15 MED ORDER — GLYCOPYRROLATE 0.2 MG/ML IJ SOLN: 0.2000 mg | INTRAMUSCULAR | Status: DC | PRN

## 2019-02-15 MED ORDER — ACETAMINOPHEN 650 MG RE SUPP: 650.0000 mg | Freq: Four times a day (QID) | RECTAL | Status: DC | PRN

## 2019-02-15 MED ORDER — POLYVINYL ALCOHOL 1.4 % OP SOLN
1.0000 [drp] | Freq: Four times a day (QID) | OPHTHALMIC | Status: DC | PRN
  Filled 2019-02-15: qty 15

## 2019-02-15 MED ORDER — ACETAMINOPHEN 325 MG PO TABS: 650.0000 mg | ORAL_TABLET | Freq: Four times a day (QID) | ORAL | Status: DC | PRN

## 2019-02-15 MED ORDER — IPRATROPIUM-ALBUTEROL 0.5-2.5 (3) MG/3ML IN SOLN
3.0000 mL | Freq: Four times a day (QID) | RESPIRATORY_TRACT | Status: DC
  Administered 2019-02-16: 3 mL via RESPIRATORY_TRACT
  Filled 2019-02-15: qty 3

## 2019-02-15 MED ORDER — MORPHINE 100MG IN NS 100ML (1MG/ML) PREMIX INFUSION
0.0000 mg/h | INTRAVENOUS | Status: DC
  Administered 2019-02-15: 5 mg/h via INTRAVENOUS
  Administered 2019-02-15 – 2019-02-16 (×2): 10 mg/h via INTRAVENOUS
  Filled 2019-02-15 (×3): qty 100

## 2019-02-15 NOTE — Progress Notes (Signed)
Pt has had different pupillary responses throughout the shift. Now having decorticate posturing. No seizure activity this shift. No response to stimuli except the posturing and hr.. B/p has been labile

## 2019-02-15 NOTE — Progress Notes (Signed)
  Consult received 03/13/2019. Chart reviewed and pt seen. Per chart review, goals in place for one-way extubation as well as comfort meds started. Pt is still on levo while we wait for family to gather and come to the hospital, likely today. Staffed with PCCM, Noe Gens, NP. No need for consult at this point. Will call PMT should future needs arise. Thank you, Romona Curls, NP [place] note

## 2019-02-15 NOTE — Plan of Care (Signed)

## 2019-02-15 NOTE — Progress Notes (Signed)
NAME:  Anthony Moon, MRN:  [id number], DOB:  Jan 12, 1966, LOS: 1 ADMISSION DATE:  02/15/2019, CONSULTATION DATE:  02/15/2019 REFERRING MD:  Dr. Leonides Schanz, CHIEF COMPLAINT:  Cardiac Arrest   Brief History   53 y/o M with adenocarcinoma of lung with metastatic pleural effusions who presented to [facility] ER 5/1 in cardiac arrest. The patient was en route to [facility] for pleurX catheter placement when he became unresponsive.  Approximately 10 minutes downtime before ROSC.  Intubated on arrival. Chest tube placed in ER for complete white out of right hemi-thorax with 2L+ of brown fluid with strong odor removed.    Past Medical History  Adenocarcinoma of lung with metastatic pleural effusion CAD s/p DES to LAD NICM  HTN GERD Bilateral inguinal hernia  Tobacco Abuse - quit 12/2018, 43 years 1ppd ETOH Abuse - quit   [facility] Events   5/01 Admit post arrest  Consults:    Procedures:  ETT 5/1 >>  R Fem TLC 5/1 >>   Significant Diagnostic Tests:  EEG 5/1 >> no seizure or seizure predisposition, generalized non-specific cerebral dysfunction CTH 5/1 >> normal   Micro Data:  Pleural fluid culture 5/1 >> GNR, GPC's in pairs, GPR >>  BCx2 5/1 >>  Tracheal aspirate 5/1 >>  UA 5/1 >>   Antimicrobials:  Merropenem 5/1 >>   Interim history/subjective:  RN reports ongoing seizure like activity / posturing overnight, propofol turned off before shift change.  Pt remains on 8 mcg's levophed.  Son attempting to get to the hospital but having car troubles.   Objective   Blood pressure 110/75, pulse (!) 118, temperature 97.9 F (36.6 C), temperature source Oral, resp. rate (!) 21, weight 73.6 kg, SpO2 95 %.    Vent Mode: PRVC FiO2 (%):  [40 %-100 %] 40 % Set Rate:  [20 bmp-24 bmp] 20 bmp Vt Set:  [580 mL] 580 mL PEEP:  [5 cmH20-6 cmH20] 5 cmH20 Plateau Pressure:  [12 cmH20-24 cmH20] 12 cmH20   Intake/Output Summary (Last 24 hours) at 02/15/2019 7616 Last data filed at 02/15/2019 0737 Gross per 24 hour   Intake 7135.18 ml  Output 1735 ml  Net 5400.18 ml   Filed Weights   02/15/19 0455  Weight: 73.6 kg    Examination: General: chronically ill appearing adult male lying in bed in NAD HEENT: MM pink/moist, ETT Neuro: Left pupil 41mm/sluggish, R pupil 36mm, no response to voice, stimulation CV: s1s2 rrr, no m/r/g PULM: even/non-labored on vent, clear on left, diminished on right  TG:GYIR, non-tender, bsx4 active  Extremities: warm/dry, BLE 2+ pitting edema  Skin: no rashes or lesions  Resolved Hospital Problem list      Assessment & Plan:   Cardiac Arrest  -unknown initial rhythm, approx 10 minutes downtime -CT head negative  P: ICU monitoring  Levophed for MAP >65 Family attempting to mobilize to the area > brother en route  NS at 33ml/hr  Acute Respiratory Insufficiency  -post arrest, suspect related to large effusion, advanced lung cancer Tobacco Abuse  -quit smoking at diagnosis of lung cancer P: PRVC 8cc/kg, reduce rate to 16 Wean PEEP / FiO2 for sats 88-95% Follow tracheal aspirate   Large Pleural Effusion (R) Metastatic Adenocarcinoma of Lung -initial dx by Dr. Melvyn Novas 4/22 by FOB, RUL, adenopathy, adrenal nodules, pleural metastatic disease  P: Appreciate CVTS evaluation Await pleural cultures  Fentanyl for pain  Hold home oral narcotic regimen for now  Hold further abx Plan for palliative extubation this afternoon once family  arrives  Acute Metabolic Acidosis  At Risk AKI  P: Trend BMP / urinary output Replace electrolytes as indicated Avoid nephrotoxic agents, ensure adequate renal perfusion  Malnutrition  -albumin 1.2 on admit, 25lb wt loss in last 2 months  P: Hold TF   Thrombocytosis  P: Trend CBC, await am labs  HTN, CAD with DES P: Hold home antihypertensives  Hold restart of plavix given plan for palliative extubation  Best practice:  Diet: NPO Pain/Anxiety/Delirium protocol (if indicated): PRN fentanyl VAP protocol (if indicated):  in place  DVT prophylaxis: heparin  GI prophylaxis: pepcid  Glucose control: n/a  Mobility: bedrest  Code Status: DNR  Family Communication: Wife updated on plan of care 5/2.  Once family arrives, will proceed with palliative extubation.  Full comfort measures.  Disposition: ICU  Labs   CBC: Recent Labs  Lab 02/13/19 1504 03/02/2019 0651 03/09/2019 1222  WBC 44.0* 48.4*  --   NEUTROABS 38.9* 41.6*  --   HGB 13.9 10.8* 12.6*  HCT 41.4 33.3* 37.0*  MCV 88.3 93.8  --   PLT 653* 461*  --     Basic Metabolic Panel: Recent Labs  Lab 02/13/19 1504 02/22/2019 0651 02/26/2019 1222  NA 122* 120* 121*  K 5.5* 5.6* 4.2  CL 84* 83*  --   CO2 26 17*  --   GLUCOSE 121* 127*  --   BUN 16 16  --   CREATININE 0.64 0.96  --   CALCIUM 8.3* 7.8*  --    GFR: Estimated Creatinine Clearance: 90.8 mL/min (by C-G formula based on SCr of 0.96 mg/dL). Recent Labs  Lab 02/13/19 1504 03/08/2019 0651 03/15/2019 0800  WBC 44.0* 48.4*  --   LATICACIDVEN  --  7.7* 5.5*    Liver Function Tests: Recent Labs  Lab 02/13/19 1504 03/07/2019 0651  AST 32 39  ALT 34 29  ALKPHOS 504* 303*  BILITOT 0.5 0.6  PROT 6.4* 4.2*  ALBUMIN 1.7* 1.2*   No results for input(s): LIPASE, AMYLASE in the last 168 hours. No results for input(s): AMMONIA in the last 168 hours.  ABG    Component Value Date/Time   PHART 7.464 (H) 03/07/2019 1222   PCO2ART 37.3 03/11/2019 1222   PO2ART 526.0 (H) 02/25/2019 1222   HCO3 26.9 02/28/2019 1222   TCO2 28 02/24/2019 1222   ACIDBASEDEF 10.4 (H) 03/04/2019 0645   O2SAT 100.0 02/18/2019 1222     Coagulation Profile: Recent Labs  Lab 02/25/2019 0651  INR 1.5*    Cardiac Enzymes: Recent Labs  Lab 02/19/2019 0651  TROPONINI 0.06*    HbA1C: Hgb A1c MFr Bld  Date/Time Value Ref Range Status  05/18/2010 12:11 PM  <5.7 % Final   5.5 (NOTE)                                                                       According to the ADA Clinical Practice Recommendations for  2011, when HbA1c is used as a screening test:   >=6.5%   Diagnostic of Diabetes Mellitus           (if abnormal result  is confirmed)  5.7-6.4%   Increased risk of developing Diabetes Mellitus  References:Diagnosis and Classification of Diabetes Mellitus,Diabetes KZLD,3570,17(BLTJQ  1):S62-S69 and Standards of Medical Care in         Diabetes - 2011,Diabetes KICH,7981,02  (Suppl 1):S11-S61.    CBG: Recent Labs  Lab 02/11/19 0957 03/06/2019 0620  GLUCAP 116* 84    Critical care time: 18 minutes    Noe Gens, NP-C [facility] Pgr: [phone] or if no answer [phone] 02/15/2019, 7:42 AM

## 2019-02-15 NOTE — Progress Notes (Signed)
Spoke with pt wife. Pt brother en route from [place] of state, will not be in town until late tonight r/t car trouble. Family requesting to wait until tomorrow for visitation and withdrawal of care. MD and NP made aware, pt to remain on morphine gtt at this time, to be kept comfortable on the vent overnight.

## 2019-02-16 DIAGNOSIS — A419 Sepsis, unspecified organism: Secondary | ICD-10-CM | POA: Diagnosis present

## 2019-02-16 DIAGNOSIS — J869 Pyothorax without fistula: Secondary | ICD-10-CM | POA: Diagnosis present

## 2019-02-16 DIAGNOSIS — J910 Malignant pleural effusion: Secondary | ICD-10-CM

## 2019-02-16 DIAGNOSIS — R6521 Severe sepsis with septic shock: Secondary | ICD-10-CM

## 2019-02-17 ENCOUNTER — Encounter: Payer: Managed Care, Other (non HMO) | Admitting: Thoracic Surgery (Cardiothoracic Vascular Surgery)

## 2019-02-17 ENCOUNTER — Encounter: Payer: Self-pay | Admitting: *Deleted

## 2019-02-17 NOTE — Progress Notes (Signed)
Oncology Nurse Navigator Documentation  Oncology Nurse Navigator Flowsheets 02/17/2019  Navigator Location [facility]  Referral date to RadOnc/MedOnc -  Navigator Encounter Type Other/I followed up on patients procedure and learned patient passed away on 01-19-2023.  I called [facility] can cancelled [facility] and [facility].  I called [facility] to cancel request.  I called [facility] lab to cancel [facility] and the test was already sent out.  I called [facility] and they were able to cancel the lab request.  I called and cancelled radiology request and updated authorization coordinator on patents passing as well.    Telephone -  Abnormal Finding Date -  Confirmed Diagnosis Date -  Patient Visit Type -  Treatment Phase Other  Barriers/Navigation Needs Coordination of Care  Education -  Interventions Other  Coordination of Care Other  Education Method -  Acuity Level 3  Time Spent with Patient 60

## 2019-02-19 ENCOUNTER — Telehealth: Payer: Self-pay | Admitting: *Deleted

## 2019-02-19 LAB — CULTURE, BLOOD (ROUTINE X 2)
Culture: NO GROWTH
Culture: NO GROWTH
Culture: NO GROWTH
Culture: NO GROWTH
Special Requests: ADEQUATE

## 2019-02-19 LAB — BODY FLUID CULTURE

## 2019-02-19 NOTE — Telephone Encounter (Signed)
Received original D/C from [facility] [employer]-D/C Forwarded to [facility]) for signature.

## 2019-02-20 ENCOUNTER — Other Ambulatory Visit (HOSPITAL_COMMUNITY): Payer: Managed Care, Other (non HMO)

## 2019-02-20 NOTE — Telephone Encounter (Signed)
Receivd original Signed D/C-[facility]eral home notified for pick up, along with a copy being faxed to funeral home.

## 2019-02-21 ENCOUNTER — Other Ambulatory Visit: Payer: Managed Care, Other (non HMO)

## 2019-02-25 ENCOUNTER — Ambulatory Visit: Payer: Managed Care, Other (non HMO)

## 2019-02-25 ENCOUNTER — Other Ambulatory Visit: Payer: Managed Care, Other (non HMO)

## 2019-03-04 ENCOUNTER — Ambulatory Visit: Payer: Managed Care, Other (non HMO) | Admitting: Physician Assistant

## 2019-03-04 ENCOUNTER — Other Ambulatory Visit: Payer: Managed Care, Other (non HMO)

## 2019-03-11 ENCOUNTER — Other Ambulatory Visit: Payer: Managed Care, Other (non HMO)

## 2019-03-17 NOTE — Death Summary Note (Addendum)
DEATH SUMMARY   Patient Details  Name: Anthony Moon MRN: [id number] DOB: March 29, 1966  Admission/Discharge Information   Admit Date:  02/21/2019  Date of Death: Date of Death: (P) 02/23/2019  Time of Death: Time of Death: (P) 12-31-57  Length of Stay: 2  Referring Physician: London Pepper, MD   Reason(s) for Hospitalization  Cardiac Arrest  Diagnoses  Preliminary cause of death: Cardiac arrest secondary to tamponade secondary to right empyema related to malignant pleural effusion in setting of metastatic lung cancer Secondary Diagnoses (including complications and co-morbidities):  Active Problems:   Malignant pleural effusion   Palliative care encounter   Cardiac arrest [facility])   Acute respiratory failure with hypoxia (HCC)   Empyema, right (HCC)   Septic shock (HCC) Septic shock secondary to empyema  Brief Hospital Course (including significant findings, care, treatment, and services provided and events leading to death)  Anthony Moon is a 53 y.o. year old male with metastatic adenocarcinoma of the lung who presented to the ED on 02/21/23 in cardiac arrest.  Prior to arrival, he was on route to [facility] for Pleurx catheter placement for pleural effusion when he became unresponsive.  He was down for 10 minutes prior to initiating ACLS and achieving ROSC.  Chest tube was placed in the ED and drained 2 L of fluid likely to represent empyema based on Gram stain and labs.  During the course of the hospitalization, patient demonstrated posturing.  After discussion with family regarding patient's current medical condition in setting of his chronic illnesses including cancer, family wishes to withdraw care.  Pain medications and anxiolytics started for comfort measures. Patient terminally extubated and expired at 13:59 PM on 2019-02-23.  Pertinent Labs and Studies  Significant Diagnostic Studies Dg Chest 1 View  Result Date: 01/22/2019 CLINICAL DATA:  Post   thora  Rt  side EXAM: CHEST  1 VIEW COMPARISON:   the previous day's study FINDINGS: No pneumothorax post thoracentesis. Slight improvement in moderately large right pleural effusion with persistent consolidation/atelectasis particularly at the lung base. Left lung remains clear. Heart size and mediastinal contours are within normal limits. Visualized bones unremarkable. IMPRESSION: 1. No pneumothorax post right thoracentesis Electronically Signed   By: Lucrezia Europe M.D.   On: 01/22/2019 11:46   Dg Chest 2 View  Result Date: 02/11/2019 CLINICAL DATA:  53 year old male with right-sided pleural effusion. EXAM: CHEST - 2 VIEW COMPARISON:  Prior chest x-ray 01/22/2019; prior PET-CT 02/11/2019 FINDINGS: Complete whiteout of the right lung. A small amount of loculated air is present within the right lung base likely representing entrapped air within necrotic tumor. There is no normally aerated lung visible. The left lung remains well aerated. Nodular opacity adjacent to the left cardiac margin again noted. No acute osseous abnormality. IMPRESSION: 1. Persistent white out of the right hemithorax secondary to a combination of necrotic tumor and postobstructive collapse of the right lung. The degree of superimposed pleural fluid is difficult to assess. 2. Loculated air within the right upper and middle lobes most consistent with entrapped air within the anterior loculated pleural effusion versus necrotic tumor. 3. Left lower lobe juxta cardiac pulmonary nodule. Electronically Signed   By: Jacqulynn Cadet M.D.   On: 02/11/2019 14:11   Dg Chest 2 View  Result Date: 01/21/2019 CLINICAL DATA:  Cough and right-sided chest pain for several months EXAM: CHEST - 2 VIEW COMPARISON:  06/04/2018 FINDINGS: Cardiac shadows within normal limits. Left lung is hyperinflated with stable chronic changes along the left base  similar to that seen on the prior exam. This projects in the lingula on the lateral projection. Moderate right-sided pleural effusion is noted with right basilar  volume loss consistent with acute infiltrate. No bony abnormality is noted. IMPRESSION: Moderate right pleural effusion with basilar volume loss consistent with pneumonia. Chronic appearing changes in the left base. Electronically Signed   By: Inez Catalina M.D.   On: 01/21/2019 10:14   Ct Head Wo Contrast  Result Date: 02/26/2019 CLINICAL DATA:  Cardiac arrest. EXAM: CT HEAD WITHOUT CONTRAST TECHNIQUE: Contiguous axial images were obtained from the base of the skull through the vertex without intravenous contrast. COMPARISON:  CT head dated October 15, 2015. FINDINGS: Brain: No evidence of acute infarction, hemorrhage, hydrocephalus, extra-axial collection or mass lesion/mass effect. Vascular: No hyperdense vessel or unexpected calcification. Skull: Normal. Negative for fracture or focal lesion. Sinuses/Orbits: No acute finding. Other: None. IMPRESSION: 1. Normal noncontrast head CT. Electronically Signed   By: Titus Dubin M.D.   On: 03/14/2019 10:20   Ct Chest W Contrast  Result Date: 01/29/2019 CLINICAL DATA:  Pleural effusion, not elsewhere classified. Thoracentesis earlier today. Patient presented with cough and right-sided chest pain for several months. Atypical cells were noted on thoracentesis performed 1 week ago. EXAM: CT CHEST WITH CONTRAST TECHNIQUE: Multidetector CT imaging of the chest was performed during intravenous contrast administration. CONTRAST:  65mL ISOVUE-300 IOPAMIDOL (ISOVUE-300) INJECTION 61% COMPARISON:  Radiographs 06/04/2018, 01/21/2019 and 01/22/2019. FINDINGS: Cardiovascular: Moderate three-vessel coronary artery atherosclerosis. There is lesser atherosclerosis of the aorta and great vessels. The pulmonary arteries appear normal. There is a small pericardial effusion. The heart size is normal. Mediastinum/Nodes: No discretely enlarged mediastinal, hilar or axillary lymph nodes are identified. However, there is increased soft tissue fullness around the right hilum with  extension into the subcarinal space. There are scattered small subcarinal and left hilar calcifications.There is a nodule at the left cardiophrenic angle which measures 3.0 x 2.2 cm and 31 HU (image 124/5). This protrudes slightly into the lingula. Based on prior radiographs, this may reflect an incidental pericardial/bronchogenic cyst. The thyroid gland, trachea and esophagus demonstrate no significant findings. Lungs/Pleura: There is a large partially loculated right pleural effusion. No pneumothorax following thoracentesis. The right upper and middle lobes are completely collapsed and opacified, and there is proximal cut off of the corresponding bronchi. There is partial collapse of the right lower lobe. Anteriorly in the right upper lobe, there is an ill-defined area of decreased attenuation in the collapsed lung which measures approximately 4.0 x 3.2 x 3.1 cm. This could reflect an underlying lung mass or pneumonia. As above nodule adjacent to the left cardiophrenic angle may reflect an incidental pericardial/bronchogenic cyst, similar to prior chest radiographs. No significant left pleural effusion or other left lung nodularity. Upper abdomen: There are small nonspecific bilateral adrenal nodules, measuring 1.4 cm on the right and 1.5 cm on the left (image 162/5). These demonstrate nonspecific attenuation. No focal hepatic abnormality identified. Musculoskeletal/Chest wall: There is no chest wall mass or suspicious osseous finding. IMPRESSION: 1. Persistent large loculated right pleural effusion with complete collapse of the right upper and middle lobes and an ill-defined low-density area anteriorly in the collapsed right upper lobe. Although these findings could be secondary to pneumonia with a parapneumonic effusion, underlying neoplasm (likely primary bronchogenic carcinoma) is suspected. 2. Confluent right hilar and subcarinal soft tissue without discrete adenopathy. There are scattered calcifications in  the subcarinal and left hilar regions which may be secondary to prior granulomatous  disease. 3. Nonspecific bilateral adrenal nodules, potentially metastases. 4. Soft tissue nodule along the left cardiophrenic angle appears similar to prior chest radiographs and may reflect an incidental pericardial/bronchogenic cyst. Electronically Signed   By: Richardean Sale M.D.   On: 01/29/2019 10:12   Nm Pet Image Initial (pi) Skull Base To Thigh  Result Date: 02/11/2019 CLINICAL DATA:  Initial treatment strategy for non-small cell lung cancer. EXAM: NUCLEAR MEDICINE PET SKULL BASE TO THIGH TECHNIQUE: 8 mCi F-18 FDG was injected intravenously. Full-ring PET imaging was performed from the skull base to thigh after the radiotracer. CT data was obtained and used for attenuation correction and anatomic localization. Fasting blood glucose: 116 mg/dl COMPARISON:  Chest CT 01/29/2019 FINDINGS: Mediastinal blood pool activity: SUV max 1.9 NECK: No hypermetabolic lymph nodes in the neck. Incidental CT findings: none CHEST: Interval progression of the loculated right pleural effusion, now essentially filling the entire right hemithorax. Septations, debris, and gas are evident within the loculated pleural fluid collection. Diffuse nodular hypermetabolism is seen along the pleural surface. Hypermetabolism in the region of the right hilum demonstrates SUV max = 6.5. Hypermetabolic focus along the lateral mid right pleura (corresponding to axial image 65/series 4) demonstrates SUV max = 8.3. Nodular hypermetabolism in the right infrahilar region demonstrates SUV max = 6.0. Hypermetabolic focus in the right thoracic inlet demonstrates SUV max = 11.5 although no lymphadenopathy can be identified at this location. Hypermetabolic lesion lateral left subscapularis muscle demonstrates SUV max = 9.3. 17 mm soft tissue nodule adjacent to the cardiac apex shows no substantial hypermetabolism above blood pool activity. Incidental CT findings:  Complete right lung collapse. Mass-effect from the loculated right pleural effusion displaces cardiomediastinal anatomy into the left hemithorax. Right upper lobe bronchus is occluded. Bronchus intermedius is impacted/occluded. Small pericardial effusion evident. Coronary artery calcification is evident. Atherosclerotic calcification is noted in the wall of the thoracic aorta. No left pleural effusion. No suspicious nodule or mass in the left lung. ABDOMEN/PELVIS: 10 mm short axis hypermetabolic nodule in the right para-aortic upper abdominal region demonstrates SUV max = 6.2. Scattered areas of bowel uptake are identified in the abdomen. Incidental CT findings: Liver is displaced inferiorly into the left by the expansion of the right loculated pleural fluid collection. There is abdominal aortic atherosclerosis without aneurysm. SKELETON: Hypermetabolic left sacral lesion demonstrates SUV max = 5.1 without CT correlate. Incidental CT findings: none IMPRESSION: 1. Complete collapse of the right lung with marked enlargement of the loculated right pleural fluid collection. Internal septations, debris, and gas within the loculated fluid are concerning for superinfection/empyema. Circumferential pleural thickening and nodularity is associated. Mass-effect from the expanded/loculated pleural effusion displaces cardiomediastinal anatomy into the left hemithorax and generates mass-effect in the right upper quadrant of the abdomen. 2. Hypermetabolism is seen in the parahilar collapsed right lung, and circumferentially along the right pleura. 3. Occlusion of the right upper lobe bronchus with impaction/occlusion of the bronchus intermedius. 4. Irregular and nodular hypermetabolic disease diffusely in the right pleural space. 5. Hypermetabolism in the right thoracic inlet and medial aspect of the right upper abdomen concerning for metastatic disease. 17 mm soft tissue nodule at the cardiac apex shows no hypermetabolism on PET  imaging. 6. Hypermetabolic focus posterior left sacrum, concerning for bony metastatic disease. 7. Small pericardial effusion. Electronically Signed   By: Misty Stanley M.D.   On: 02/11/2019 12:45   Dg Chest Port 1 View  Result Date: 02/15/2019 CLINICAL DATA:  Acute hypoxic respiratory failure EXAM: PORTABLE CHEST  1 VIEW COMPARISON:  Yesterday FINDINGS: Endotracheal tube tip between the clavicular heads and carina. The orogastric tube tip at least reaches the stomach. Chest tube on the right with retention loop overlapping the base. There has been evacuation of a large pleural effusion showing a collapsed and opacified right lung. There is underlying malignancy. The left chest is clear. IMPRESSION: 1. Stable to decreased large right pneumothorax, presumably ex vacuo. No change in the collapse and opacification of the right lung. 2. Stable hardware positioning. Electronically Signed   By: Monte Fantasia M.D.   On: 02/15/2019 07:15   Dg Chest Port 1 View  Result Date: 03/13/2019 CLINICAL DATA:  Subcutaneous air. EXAM: PORTABLE CHEST 1 VIEW COMPARISON:  03/12/2019 FINDINGS: Right chest tube remains in place with continued gas filling the right pleural space. This has enlarged since prior study. This could be related to failure of the right lung to re-expand (stuck lung) or air leak/pneumothorax. Collapse, atelectatic right lung noted. Left lung clear. Heart is normal size. Endotracheal tube and NG tube are unchanged. IMPRESSION: Right chest tube remains in place with enlarging right pneumothorax which could be related to air leak or stuck lung. Electronically Signed   By: Rolm Baptise M.D.   On: 03/04/2019 20:08   Dg Chest Portable 1 View  Result Date: 02/19/2019 CLINICAL DATA:  Status post chest tube placement. Status post cardiac arrest. EXAM: PORTABLE CHEST 1 VIEW COMPARISON:  03/04/2019 at 0638 hours.  PET-CT 02/11/2019 FINDINGS: A pigtail catheter has been placed in the right chest. Tip of the catheter  is in the right lower chest. There is lucency along the periphery of the right chest which is most compatible with air. Findings are compatible with evacuation of pleural fluid but there is not been expansion of the right lung. Large amount of soft tissue and lung consolidation throughout the right chest. Small pockets of gas within the right lung is concerning for necrotic tissue based on previous PET-CT from 02/11/2019. The leftward mediastinal shift has resolved and the trachea is now right of midline. Left lung is clear without a pneumothorax. Cardiac pad overlying the left lateral chest. Nasogastric tube tip in the proximal stomach region. Sidehole of the nasogastric tube is near the GE junction. Heart size is grossly stable although the right cardiac shadow is obscured by the right lung disease. Small amount of subcutaneous gas in the right chest. Endotracheal tube is 7.1 cm above the carina IMPRESSION: 1. Placement of a right chest tube. Evacuation of right pleural fluid but now there is predominantly gas within the right pleural space. 2. Mediastinal shift has resolved following chest tube placement. 3. No significant re-expansion of the right lung compatible with chronic consolidation and malignancy. Findings are compatible with trapped lung. Again noted are small foci of lucency in the right lung concerning for necrotic tissue. 4. Endotracheal tube is at the level of the clavicular heads. 5. Nasogastric tube in the proximal stomach and recommend advancing tube if possible. Electronically Signed   By: Markus Daft M.D.   On: 02/22/2019 08:39   Dg Chest Portable 1 View  Result Date: 03/13/2019 CLINICAL DATA:  Cardiac arrest EXAM: PORTABLE CHEST 1 VIEW COMPARISON:  02/11/2019 FINDINGS: Intubation with endotracheal tube tip between the clavicular heads and carina. The orogastric tube at least reaches the stomach. White out of the right chest with leftward mediastinal shift. There is underlying malignancy by  prior chest CT. A small bore thoracotomy tube is seen over the right chest.  Likely normal heart size. IMPRESSION: 1. Right chest malignancy with large pleural effusion that causes leftward mediastinal shift. 2. Unremarkable hardware positioning. Electronically Signed   By: Monte Fantasia M.D.   On: 02/23/2019 06:56   Ir Thoracentesis Asp Pleural Space W/img Guide  Result Date: 01/29/2019 INDICATION: Patient with recurrent right pleural effusion. Request is made for diagnostic and therapeutic thoracentesis. EXAM: ULTRASOUND GUIDED DIAGNOSTIC AND THERAPEUTIC RIGHT THORACENTESIS MEDICATIONS: 10 mL 1% lidocaine COMPLICATIONS: None immediate. PROCEDURE: An ultrasound guided thoracentesis was thoroughly discussed with the patient and questions answered. The benefits, risks, alternatives and complications were also discussed. The patient understands and wishes to proceed with the procedure. Written consent was obtained. Ultrasound was performed to localize and mark an adequate pocket of fluid in the right chest. The area was then prepped and draped in the normal sterile fashion. 1% Lidocaine was used for local anesthesia. Under ultrasound guidance a 6 Fr Safe-T-Centesis catheter was introduced. Thoracentesis was performed. The catheter was removed and a dressing applied. FINDINGS: A total of approximately 1.3 liters of amber fluid was removed. Samples were sent to the laboratory as requested by the clinical team. IMPRESSION: Successful ultrasound guided diagnostic and therapeutic right thoracentesis yielding 1.3 liters of pleural fluid. Read by: Brynda Greathouse PA-C Electronically Signed   By: Markus Daft M.D.   On: 01/29/2019 11:38   Ir Thoracentesis Asp Pleural Space W/img Guide  Result Date: 01/22/2019 CLINICAL DATA:  Shortness of breath, moderate right pleural effusion EXAM: EXAM THORACENTESIS WITH ULTRASOUND TECHNIQUE: The procedure, risks (including but not limited to bleeding, infection, organ damage ),  benefits, and alternatives were explained to the patient. Questions regarding the procedure were encouraged and answered. The patient understands and consents to the procedure. Survey ultrasound of the right hemithorax was performed and an appropriate skin entry site was localized. Site was marked, prepped with Betadine, draped in usual sterile fashion, infiltrated locally with 1% lidocaine. The Saf-T-Centesis needle was advanced into the pleural space. Slightly turbid yellow fluid returned. 1 L was removed. Sample was sent for the requested laboratory studies. The patient tolerated procedure well. COMPLICATIONS: COMPLICATIONS None immediate IMPRESSION: Technically successful ultrasound-guided right thoracentesis. Follow-up chest radiograph pending. Electronically Signed   By: Lucrezia Europe M.D.   On: 01/22/2019 10:51    Microbiology Recent Results (from the past 240 hour(s))  Blood culture (routine x 2)     Status: None (Preliminary result)   Collection Time: 02/18/2019  8:00 AM  Result Value Ref Range Status   Specimen Description BLOOD CENTRAL LINE  Final   Special Requests   Final    AEROBIC BOTTLE ONLY Blood Culture results may not be optimal due to an inadequate volume of blood received in culture bottles   Culture   Final    NO GROWTH 2 DAYS Performed at [facility], [place] [address], [place], [place] [address]    Report Status PENDING  Incomplete  Blood culture (routine x 2)     Status: None (Preliminary result)   Collection Time: 03/08/2019  8:07 AM  Result Value Ref Range Status   Specimen Description BLOOD LEFT HAND  Final   Special Requests   Final    Blood Culture results may not be optimal due to an inadequate volume of blood received in culture bottles   Culture   Final    NO GROWTH 2 DAYS Performed at [facility], [place] [address], [place], [place] [address]    Report Status PENDING  Incomplete  Body fluid culture     Status: None (Preliminary result)   Collection Time:  02/26/2019 11:19 AM  Result Value Ref Range Status   Specimen Description FLUID PLEURAL RIGHT  Final   Special Requests NONE  Final   Gram Stain   Final    FEW WBC PRESENT, PREDOMINANTLY PMN ABUNDANT GRAM NEGATIVE RODS ABUNDANT GRAM POSITIVE COCCI IN PAIRS FEW GRAM POSITIVE RODS    Culture   Final    FEW STREPTOCOCCUS MITIS/ORALIS CULTURE REINCUBATED FOR BETTER GROWTH Performed at [facility], [place] [address], [place], [place] [address]    Report Status PENDING  Incomplete   Organism ID, Bacteria STREPTOCOCCUS MITIS/ORALIS  Final      Susceptibility   Streptococcus mitis/oralis - MIC*    TETRACYCLINE 8 INTERMEDIATE Intermediate     VANCOMYCIN 0.5 SENSITIVE Sensitive     CLINDAMYCIN 0.5 INTERMEDIATE Intermediate     * FEW STREPTOCOCCUS MITIS/ORALIS  Culture, blood (routine x 2)     Status: None (Preliminary result)   Collection Time: 03/09/2019 11:37 AM  Result Value Ref Range Status   Specimen Description BLOOD LEFT FOREARM  Final   Special Requests   Final    BOTTLES DRAWN AEROBIC AND ANAEROBIC Blood Culture adequate volume   Culture   Final    NO GROWTH 2 DAYS Performed at [facility], [place] [address], [place], [place] [address]    Report Status PENDING  Incomplete  Culture, blood (routine x 2)     Status: None (Preliminary result)   Collection Time: 03/01/2019 11:43 AM  Result Value Ref Range Status   Specimen Description BLOOD RIGHT ANTECUBITAL  Final   Special Requests   Final    BOTTLES DRAWN AEROBIC AND ANAEROBIC RIGHT ANTECUBITAL   Culture   Final    NO GROWTH 2 DAYS Performed at [facility], [place] [address], [place], [place] [address]    Report Status PENDING  Incomplete    Lab Basic Metabolic Panel: Recent Labs  Lab 02/13/19 1504 03/13/2019 0651 03/08/2019 1222  NA 122* 120* 121*  K 5.5* 5.6* 4.2  CL 84* 83*  --   CO2 26 17*  --   GLUCOSE 121* 127*  --   BUN 16 16  --   CREATININE 0.64 0.96  --   CALCIUM 8.3* 7.8*  --    Liver Function  Tests: Recent Labs  Lab 02/13/19 1504 02/28/2019 0651  AST 32 39  ALT 34 29  ALKPHOS 504* 303*  BILITOT 0.5 0.6  PROT 6.4* 4.2*  ALBUMIN 1.7* 1.2*   No results for input(s): LIPASE, AMYLASE in the last 168 hours. No results for input(s): AMMONIA in the last 168 hours. CBC: Recent Labs  Lab 02/13/19 1504 03/08/2019 0651 03/06/2019 1222  WBC 44.0* 48.4*  --   NEUTROABS 38.9* 41.6*  --   HGB 13.9 10.8* 12.6*  HCT 41.4 33.3* 37.0*  MCV 88.3 93.8  --   PLT 653* 461*  --    Cardiac Enzymes: Recent Labs  Lab 02/28/2019 0651  TROPONINI 0.06*   Sepsis Labs: Recent Labs  Lab 02/13/19 1504 02/15/2019 0651 03/13/2019 0800  WBC 44.0* 48.4*  --   LATICACIDVEN  --  7.7* 5.5*    Procedures/Operations  Intubation Right chest tube insertion Central line EEG  Pang Robers Rodman Pickle 03/08/2019, 2:25 PM

## 2019-03-17 NOTE — Progress Notes (Signed)
Pt noted to be asystole on the monitor. Confirmed absence of spontaneous respirations and heart tones with Andreas Blower, RN at [phone].

## 2019-03-17 NOTE — Progress Notes (Signed)
NAME:  Anthony Moon, MRN:  [id number], DOB:  May 12, 1966, LOS: 2 ADMISSION DATE:  03/05/2019, CONSULTATION DATE:  02/21/2019 REFERRING MD:  Dr. Leonides Schanz, CHIEF COMPLAINT:  Cardiac Arrest   Brief History   53 y/o M with adenocarcinoma of lung with metastatic pleural effusions who presented to [facility] ER 5/1 in cardiac arrest. The patient was en route to [facility] for pleurX catheter placement when he became unresponsive.  Approximately 10 minutes downtime before ROSC.  Intubated on arrival. Chest tube placed in ER for complete white out of right hemi-thorax with 2L+ of brown fluid with strong odor removed.    Past Medical History  Adenocarcinoma of lung with metastatic pleural effusion CAD s/p DES to LAD NICM  HTN GERD Bilateral inguinal hernia  Tobacco Abuse - quit 12/2018, 43 years 1ppd ETOH Abuse - quit   [place] [facility] Events   5/01 Admit post arrest  Consults:    Procedures:  ETT 5/1 >>  R Fem TLC 5/1 >>   Significant Diagnostic Tests:  EEG 5/1 >> no seizure or seizure predisposition, generalized non-specific cerebral dysfunction CTH 5/1 >> normal   Micro Data:  Pleural fluid culture 5/1 >> few streptococcus mitis/oralis >>  BCx2 5/1 >>  Tracheal aspirate 5/1 >>   Antimicrobials:  Merropenem 5/1 >>   Interim history/subjective:  Family unable to come to the hospital yesterday.  Pt remains intubated on morphine gtt for comfort. No acute events. Off levophed at 2300  Objective   Blood pressure 94/69, pulse (!) 107, temperature 98.7 F (37.1 C), temperature source Oral, resp. rate 20, weight 73.6 kg, SpO2 95 %.    Vent Mode: PRVC FiO2 (%):  [40 %] 40 % Set Rate:  [20 bmp] 20 bmp Vt Set:  [580 mL] 580 mL PEEP:  [5 cmH20] 5 cmH20 Plateau Pressure:  [18 cmH20-21 cmH20] 19 cmH20   Intake/Output Summary (Last 24 hours) at 02/25/19 0739 Last data filed at Feb 25, 2019 0700 Gross per 24 hour  Intake 3719.37 ml  Output 1520 ml  Net 2199.37 ml   Filed Weights   02/15/19 0455 2019-02-25  0402  Weight: 73.6 kg 73.6 kg    Examination: General: chronically ill appearing adult male lying in bed in NAD   HEENT: MM pink/moist, ETT, temporal wasting Neuro: sedate on vent, pupils 2-24mm sluggish  CV: s1s2 rrr, no m/r/g PULM: even/non-labored, clear on left, diminished on right, right chest tube with brown drainage  NW:GNFA, non-tender, bsx4 active  Extremities: warm/dry, BLE 2+ pitting edema  Skin: no rashes or lesions  Resolved Hospital Problem list      Assessment & Plan:   Cardiac Arrest  -unknown initial rhythm, approx 10 minutes downtime -CT head negative  P: Continue supportive care NS at 51ml/hr   Acute Respiratory Insufficiency  -post arrest, suspect related to large effusion, advanced lung cancer Tobacco Abuse  -quit smoking at diagnosis of lung cancer P: PRVC 8cc/kg  Plan for palliative extubation once family arrives  FiO2 to support saturations > 90%  Empyema / Large Pleural Effusion (R) Metastatic Adenocarcinoma of Lung -initial dx by Dr. Melvyn Novas 4/22 by FOB, RUL, adenopathy, adrenal nodules, pleural metastatic disease  P: Follow pleural culture  Morphine for pain  No further CXR Plan for palliative extubation today on family arrival  Acute Metabolic Acidosis  At Risk AKI  P: Hold further labs   Severe Protein Calorie Malnutrition  -albumin 1.2 on admit, 25lb wt loss in last 2 months  P: Hold TF given plan  for comfort measures  Thrombocytosis  P: Hold further labs  HTN, CAD with DES P: Hold home medications   Best practice:  Diet: NPO Pain/Anxiety/Delirium protocol (if indicated): PRN fentanyl VAP protocol (if indicated): in place  DVT prophylaxis: heparin  GI prophylaxis: pepcid  Glucose control: n/a  Mobility: bedrest  Code Status: DNR  Family Communication: Wife updated on plan of care 5/2.  Once family arrives, will proceed with palliative extubation.  Full comfort measures.  Disposition: ICU  Labs   CBC: Recent Labs   Lab 02/13/19 1504 02/26/2019 0651 03/09/2019 1222  WBC 44.0* 48.4*  --   NEUTROABS 38.9* 41.6*  --   HGB 13.9 10.8* 12.6*  HCT 41.4 33.3* 37.0*  MCV 88.3 93.8  --   PLT 653* 461*  --     Basic Metabolic Panel: Recent Labs  Lab 02/13/19 1504 02/14/2019 0651 03/09/2019 1222  NA 122* 120* 121*  K 5.5* 5.6* 4.2  CL 84* 83*  --   CO2 26 17*  --   GLUCOSE 121* 127*  --   BUN 16 16  --   CREATININE 0.64 0.96  --   CALCIUM 8.3* 7.8*  --    GFR: Estimated Creatinine Clearance: 90.8 mL/min (by C-G formula based on SCr of 0.96 mg/dL). Recent Labs  Lab 02/13/19 1504 03/07/2019 0651 03/03/2019 0800  WBC 44.0* 48.4*  --   LATICACIDVEN  --  7.7* 5.5*    Liver Function Tests: Recent Labs  Lab 02/13/19 1504 02/17/2019 0651  AST 32 39  ALT 34 29  ALKPHOS 504* 303*  BILITOT 0.5 0.6  PROT 6.4* 4.2*  ALBUMIN 1.7* 1.2*   No results for input(s): LIPASE, AMYLASE in the last 168 hours. No results for input(s): AMMONIA in the last 168 hours.  ABG    Component Value Date/Time   PHART 7.464 (H) 02/24/2019 1222   PCO2ART 37.3 02/28/2019 1222   PO2ART 526.0 (H) 03/04/2019 1222   HCO3 26.9 02/21/2019 1222   TCO2 28 03/03/2019 1222   ACIDBASEDEF 10.4 (H) 03/10/2019 0645   O2SAT 100.0 02/22/2019 1222     Coagulation Profile: Recent Labs  Lab 03/01/2019 0651  INR 1.5*    Cardiac Enzymes: Recent Labs  Lab 02/20/2019 0651  TROPONINI 0.06*    HbA1C: Hgb A1c MFr Bld  Date/Time Value Ref Range Status  05/18/2010 12:11 PM  <5.7 % Final   5.5 (NOTE)                                                                       According to the ADA Clinical Practice Recommendations for 2011, when HbA1c is used as a screening test:   >=6.5%   Diagnostic of Diabetes Mellitus           (if abnormal result  is confirmed)  5.7-6.4%   Increased risk of developing Diabetes Mellitus  References:Diagnosis and Classification of Diabetes Mellitus,Diabetes ZOXW,9604,54(UJWJX 1):S62-S69 and Standards of Medical  Care in         Diabetes - 2011,Diabetes BJYN,8295,62  (Suppl 1):S11-S61.    CBG: Recent Labs  Lab 02/11/19 0957 03/07/2019 0620  GLUCAP 116* 84    Critical care time: 30 minutes    Noe Gens, NP-C [facility] Pgr: [phone] or if no answer [phone] Mar 01, 2019, 7:39 AM

## 2019-03-17 NOTE — Progress Notes (Signed)
20 cc of Morphine wasted in sink witnessed by myself and Luberta Mutter, RN. [place], Ardeth Sportsman

## 2019-03-17 NOTE — Procedures (Signed)
Extubation Procedure Note  Patient Details:   Name: Anthony Moon DOB: November 16, 1965 MRN: [id number]   Airway Documentation:  Airway 7.5 mm (Active)  Secured at (cm) 25 cm 25-Feb-2019  8:08 AM  Measured From Lips 2019/02/25  8:08 AM  Secured Location Left 25-Feb-2019  8:08 AM  Secured By [employer] Feb 25, 2019  8:08 AM  Tube Holder Repositioned Yes 2019/02/25  8:08 AM  Cuff Pressure (cm H2O) 28 cm H2O Feb 25, 2019  8:08 AM  Site Condition Dry 2019/02/25  8:08 AM   Vent end date: (not recorded) Vent end time: (not recorded)   Evaluation  O2 sats: stable throughout Complications: No apparent complications Patient did tolerate procedure well. Bilateral Breath Sounds: Clear, Diminished   No   Terminal extubation.  Spouse is at bedside  [name] 02-25-19, 11:44 AM

## 2019-03-17 NOTE — Progress Notes (Signed)
Chaplain responded to request for prayer around EOL. Wife of 30+ years is present, exhausted, concerns over safety around Covid 19, actively grieving.  Pt/wife's family tradition is [employer].  Provided prayer and emotional support.    Please contact as needed for F/U.  Luana Shu [phone]   03/11/2019 1200  Clinical Encounter Type  Visited With Patient and family together  Visit Type Initial;Patient actively dying  Referral From Family  Consult/Referral To Chaplain  Spiritual Encounters  Spiritual Needs Prayer  Stress Factors  Family Stress Factors Major life changes

## 2019-03-17 DEATH — deceased

## 2019-03-19 ENCOUNTER — Ambulatory Visit: Payer: Managed Care, Other (non HMO)

## 2019-03-19 ENCOUNTER — Ambulatory Visit: Payer: Managed Care, Other (non HMO) | Admitting: Physician Assistant

## 2019-03-19 ENCOUNTER — Other Ambulatory Visit: Payer: Managed Care, Other (non HMO)

## 2019-03-25 ENCOUNTER — Other Ambulatory Visit: Payer: Managed Care, Other (non HMO)

## 2019-04-01 ENCOUNTER — Other Ambulatory Visit: Payer: Managed Care, Other (non HMO)

## 2019-04-28 ENCOUNTER — Institutional Professional Consult (permissible substitution): Payer: Managed Care, Other (non HMO) | Admitting: Internal Medicine

## 2020-07-16 IMAGING — CR CHEST - 2 VIEW
2 series · 2 of 2 positions shown · non-contrast
Comparison: 06/04/2018

CLINICAL DATA: Cough and right-sided chest pain for several months

EXAM:
CHEST - 2 VIEW

[w chest pa]
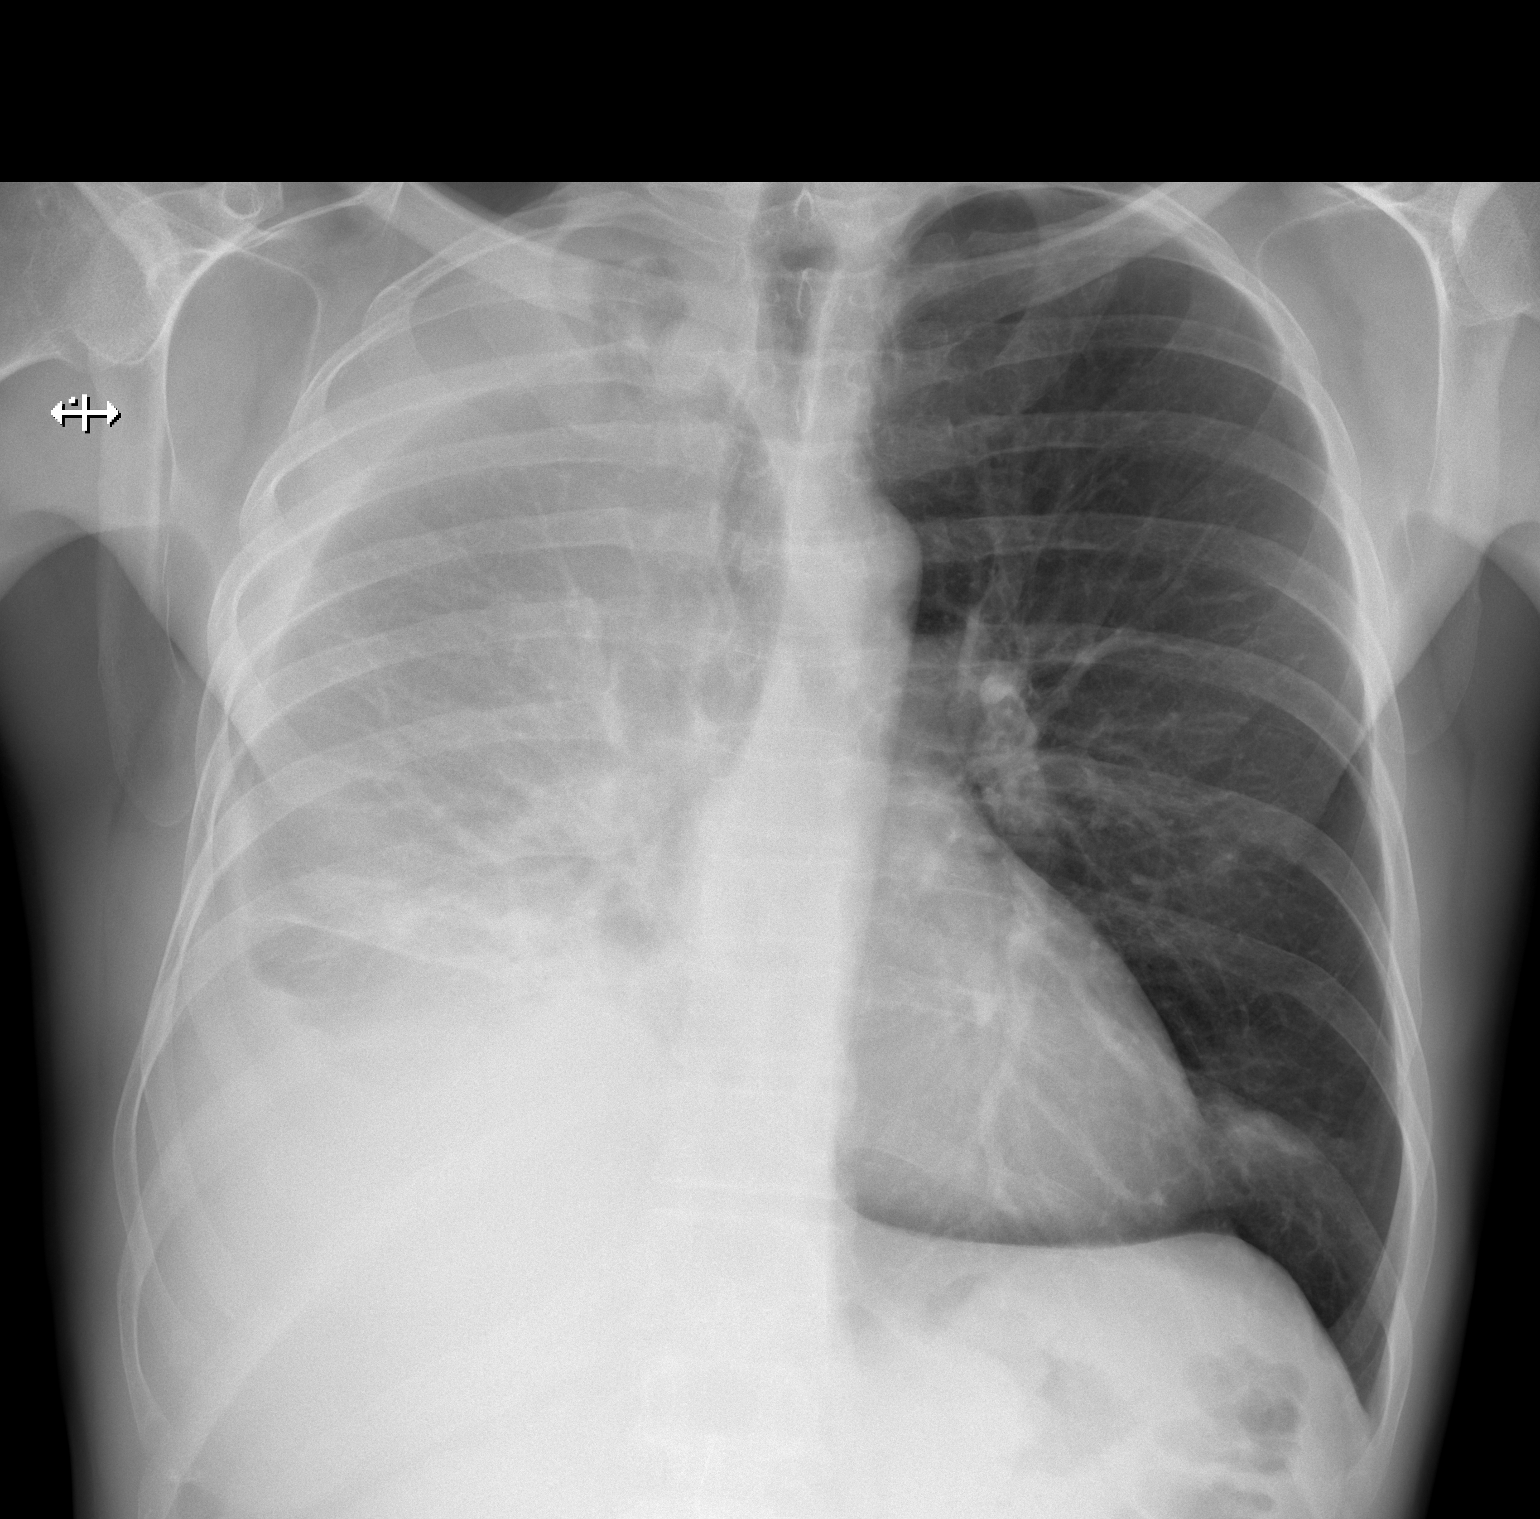

[w chest lat]
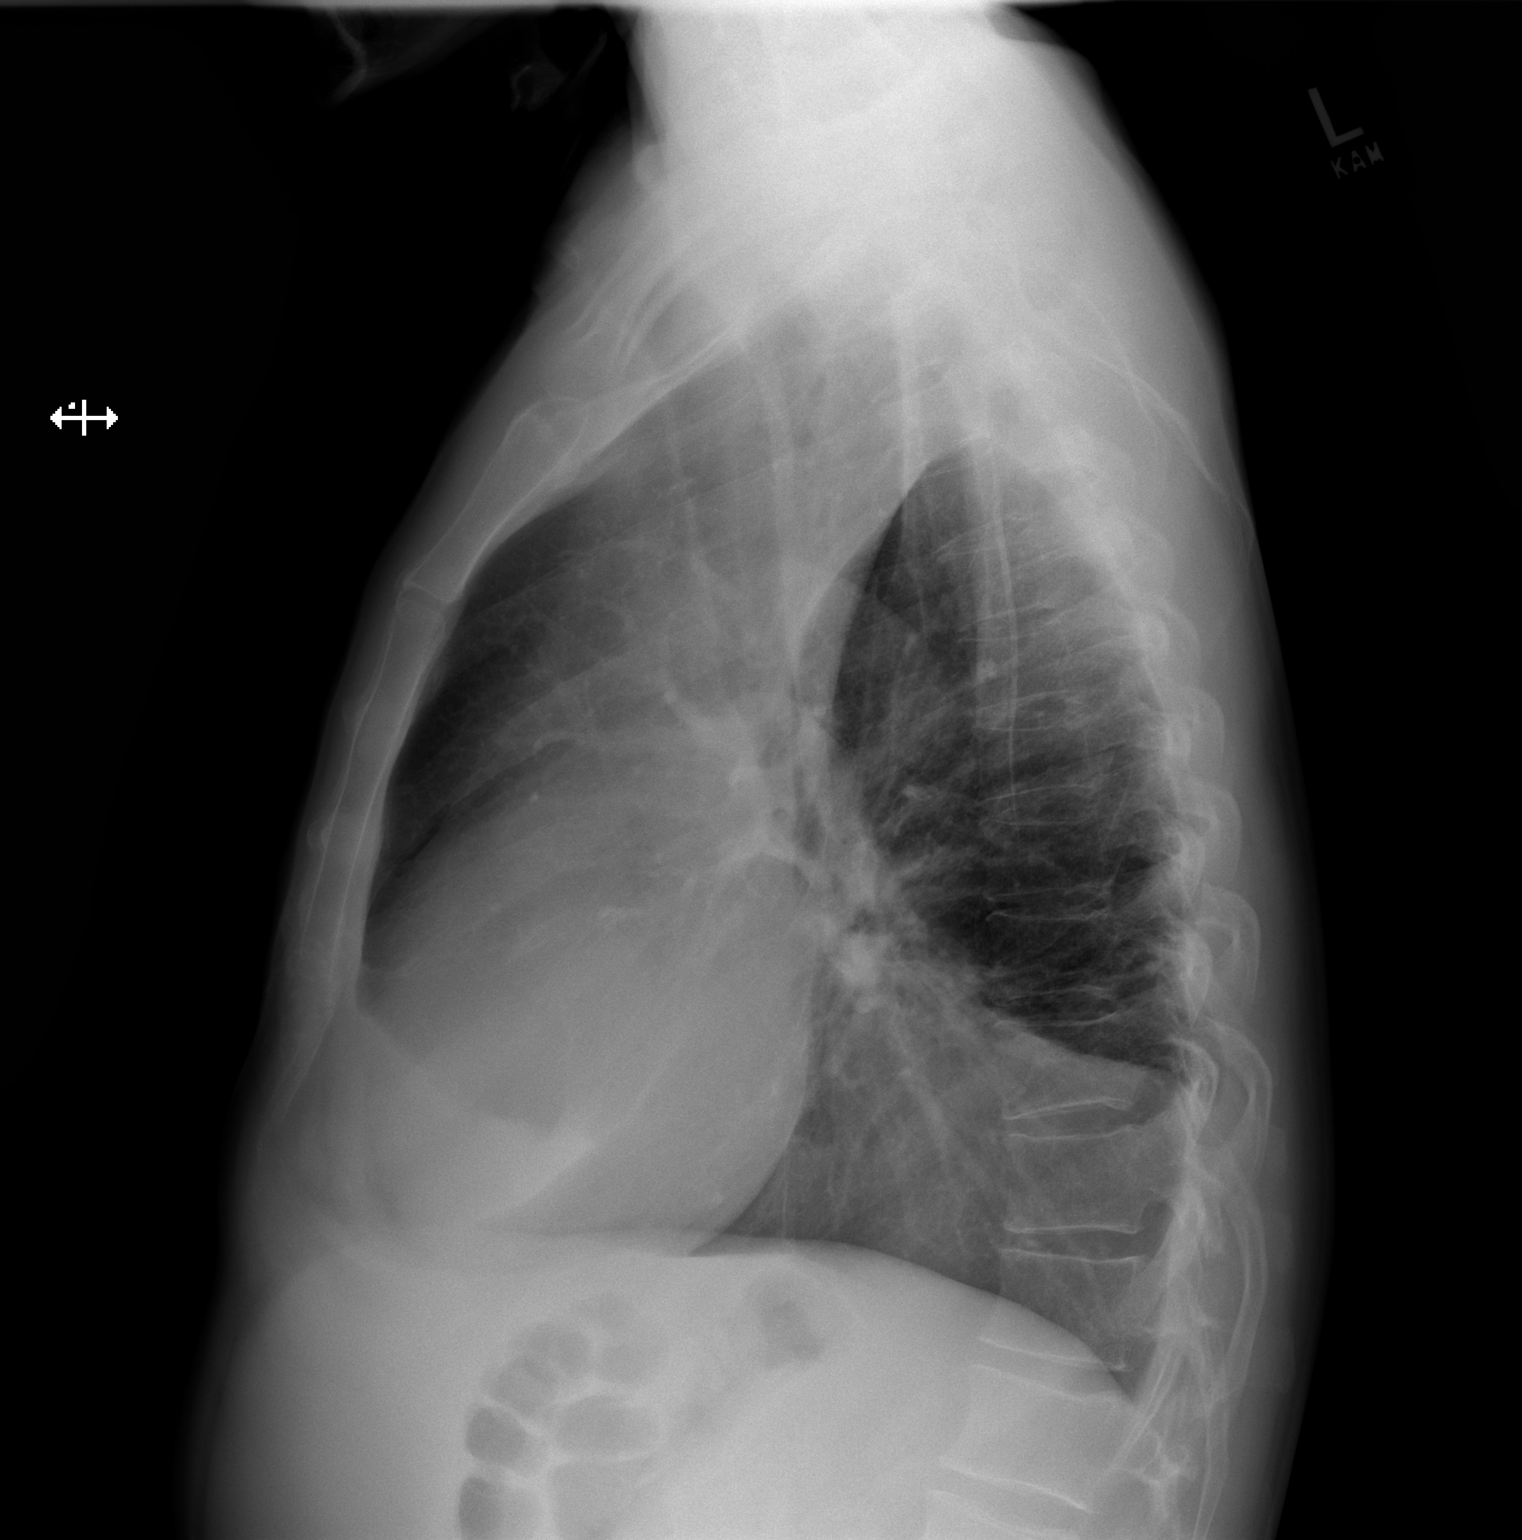

[2 of 2 positions shown; findings below may reference images not displayed]

FINDINGS: Cardiac shadows within normal limits. Left lung is hyperinflated
with stable chronic changes along the left base similar to that seen
on the prior exam. This projects in the lingula on the lateral
projection. Moderate right-sided pleural effusion is noted with
right basilar volume loss consistent with acute infiltrate. No bony
abnormality is noted.
IMPRESSION: Moderate right pleural effusion with basilar volume loss consistent
with pneumonia.

Chronic appearing changes in the left base.

## 2020-07-17 IMAGING — US IR THORACENTESIS ASP PLEURAL SPACE W/IMG GUIDE
1 series · 3 of 3 positions shown · non-contrast
Comparison: none

CLINICAL DATA: Shortness of breath, moderate right pleural effusion

EXAM:
EXAM
THORACENTESIS WITH ULTRASOUND
TECHNIQUE: The procedure, risks (including but not limited to bleeding,
infection, organ damage ), benefits, and alternatives were explained
to the patient. Questions regarding the procedure were encouraged
and answered. The patient understands and consents to the procedure.

[Series 1: ir (id) (id)/(id)/(id) ir · 3 of 3 slices shown]
[im 1/3]
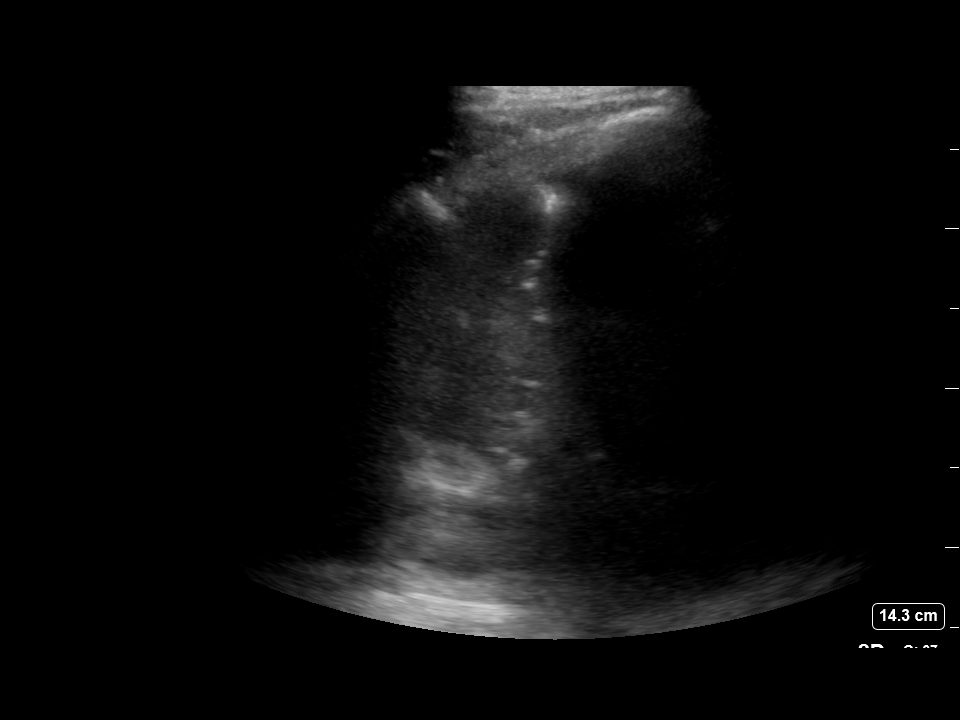
[im 2/3]
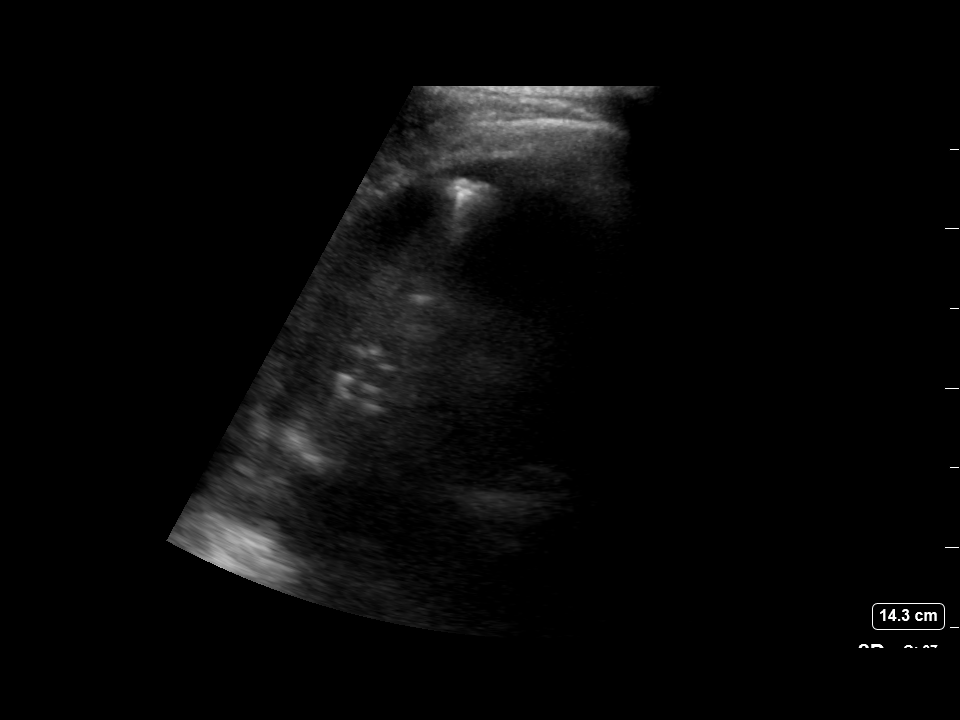
[im 3/3]
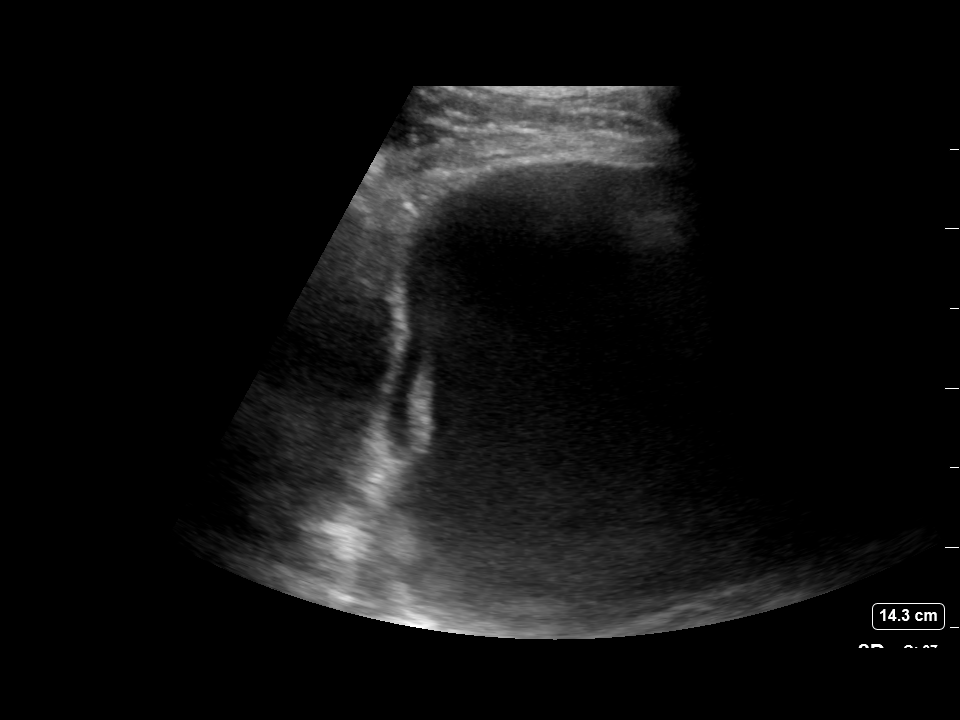

[3 of 3 positions shown; findings below may reference images not displayed]

Survey ultrasound of the right hemithorax was performed and an
appropriate skin entry site was localized. Site was marked, prepped
with Betadine, draped in usual sterile fashion, infiltrated locally
with 1% lidocaine.

The Astia needle was advanced into the pleural space.
Slightly turbid yellow fluid returned. 1 L was removed. Sample was
sent for the requested laboratory studies. The patient tolerated
procedure well.

COMPLICATIONS:
COMPLICATIONS
None immediate
IMPRESSION: Technically successful ultrasound-guided right thoracentesis.
Follow-up chest radiograph pending.

## 2020-07-17 IMAGING — CR CHEST  1 VIEW
1 series · 1 of 1 positions shown · non-contrast
Comparison: the previous day's study

CLINICAL DATA: Yosara   Tateley  Rt  side

EXAM:
CHEST  1 VIEW

[chest pa]
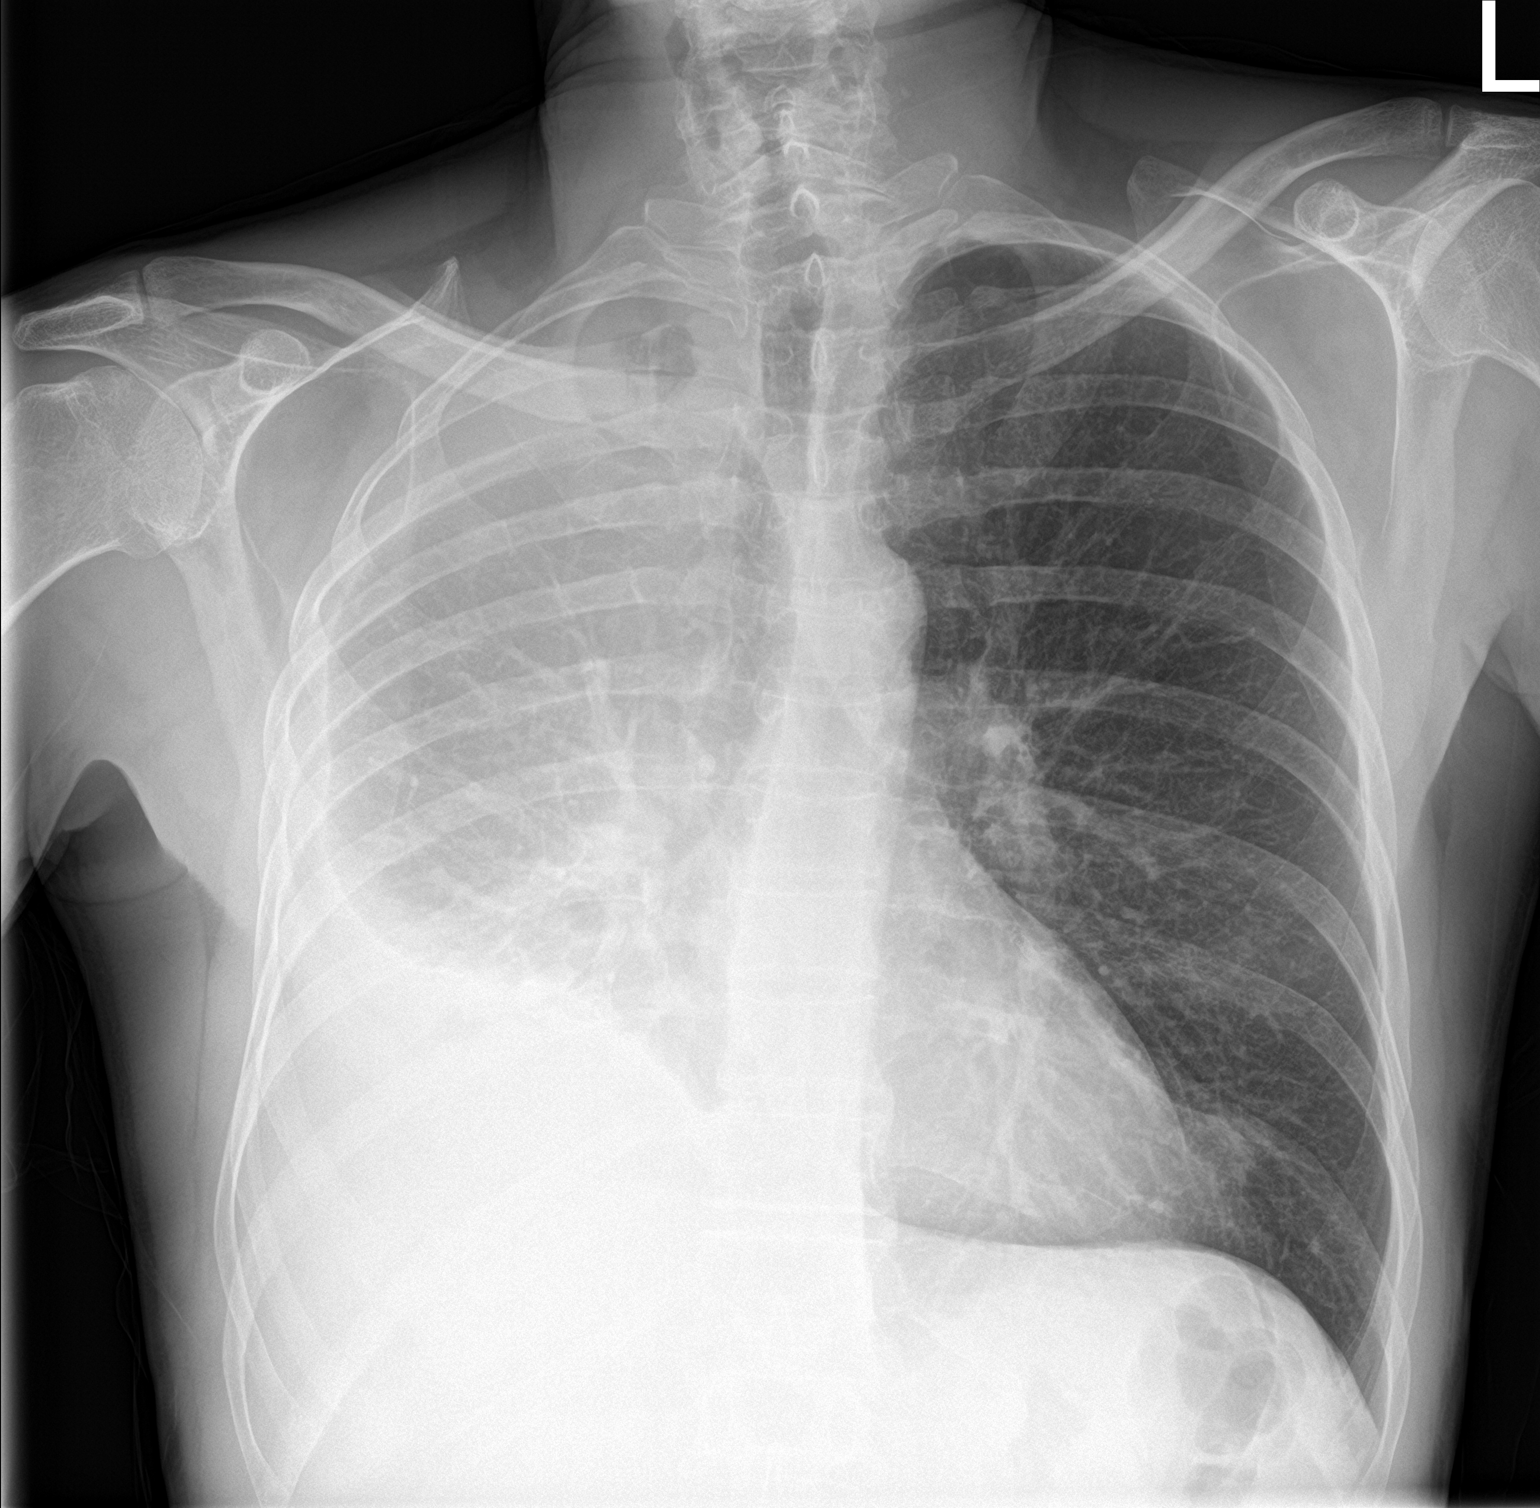

[1 of 1 positions shown; findings below may reference images not displayed]

FINDINGS: No pneumothorax post thoracentesis. Slight improvement in moderately
large right pleural effusion with persistent
consolidation/atelectasis particularly at the lung base. Left lung
remains clear.

Heart size and mediastinal contours are within normal limits.

Visualized bones unremarkable.
IMPRESSION: 1. No pneumothorax post right thoracentesis

## 2020-07-24 IMAGING — US IR THORACENTESIS ASP PLEURAL SPACE W/IMG GUIDE
1 series · 3 of 3 positions shown · non-contrast
Comparison: none

INDICATION: Patient with recurrent right pleural effusion. Request is made for
diagnostic and therapeutic thoracentesis.

[Series 1: ir (id) (id)/(id)/(id) ir · 3 of 3 slices shown]
[im 1/3]
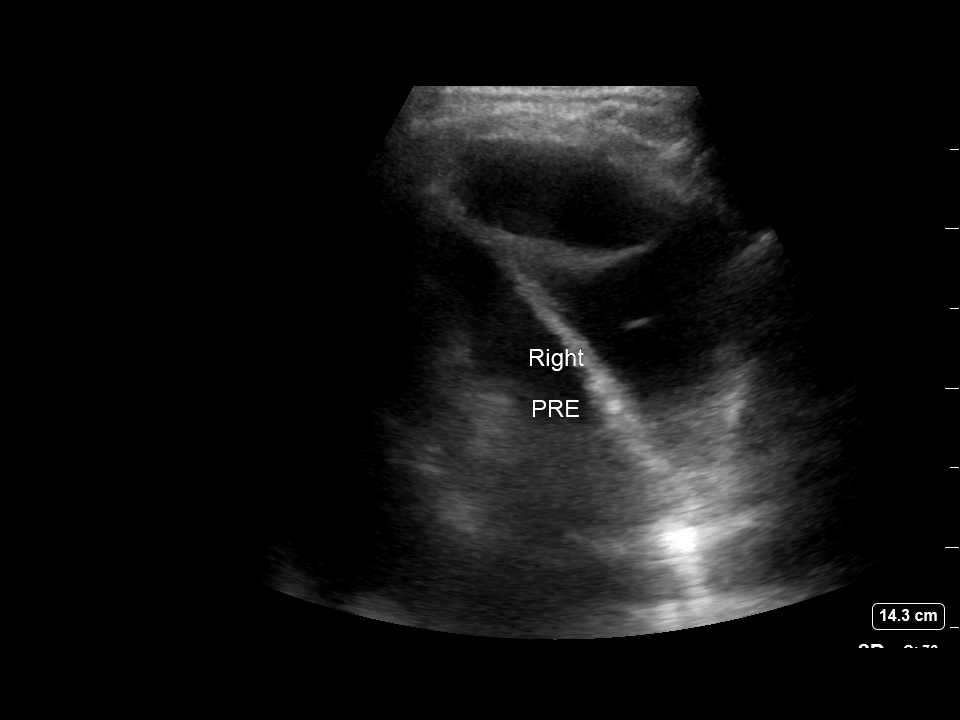
[im 2/3]
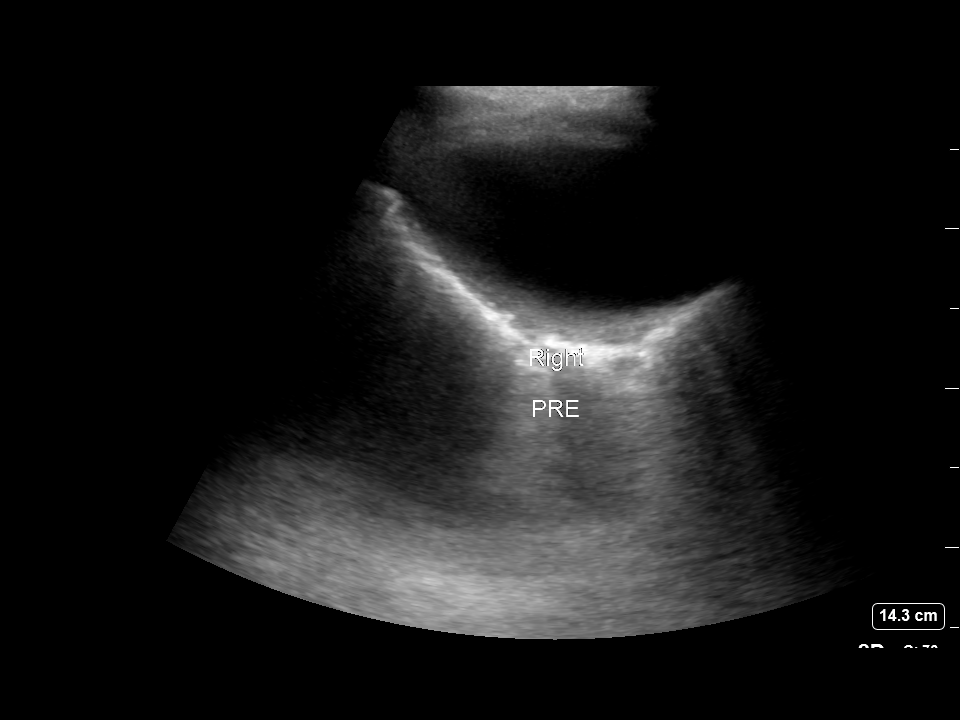
[im 3/3]
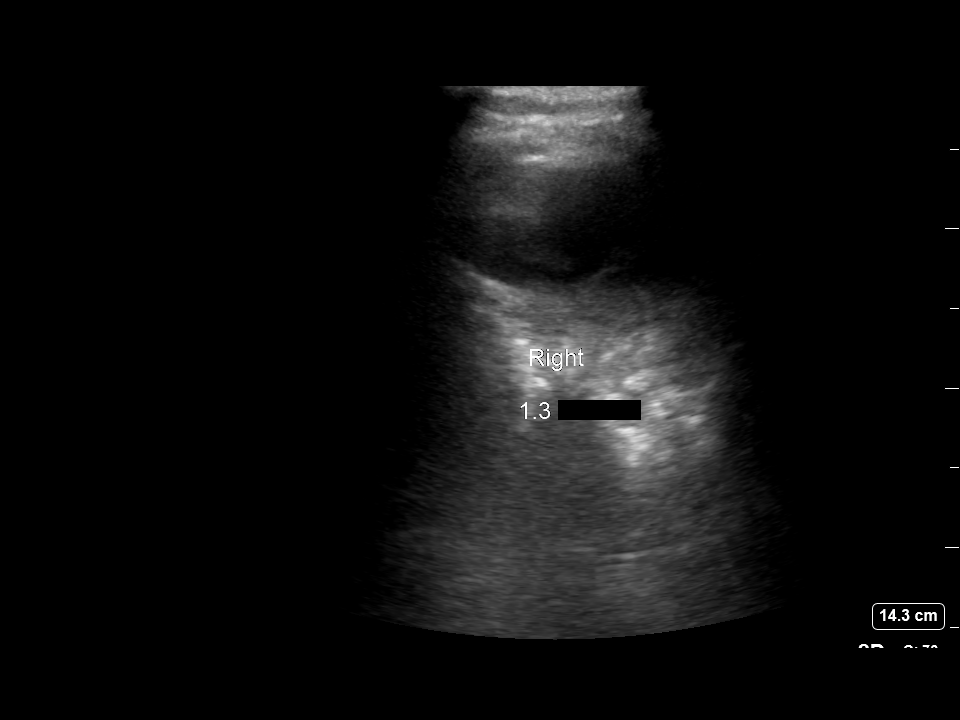

[3 of 3 positions shown; findings below may reference images not displayed]

EXAM:
ULTRASOUND GUIDED DIAGNOSTIC AND THERAPEUTIC RIGHT THORACENTESIS

MEDICATIONS:
10 mL 1% lidocaine

COMPLICATIONS:
None immediate.

PROCEDURE:
An ultrasound guided thoracentesis was thoroughly discussed with the
patient and questions answered. The benefits, risks, alternatives
and complications were also discussed. The patient understands and
wishes to proceed with the procedure. Written consent was obtained.

Ultrasound was performed to localize and mark an adequate pocket of
fluid in the right chest. The area was then prepped and draped in
the normal sterile fashion. 1% Lidocaine was used for local
anesthesia. Under ultrasound guidance a 6 Fr Safe-T-Centesis
catheter was introduced. Thoracentesis was performed. The catheter
was removed and a dressing applied.
FINDINGS: A total of approximately 1.3 liters of amber fluid was removed.
Samples were sent to the laboratory as requested by the clinical
team.
IMPRESSION: Successful ultrasound guided diagnostic and therapeutic right
thoracentesis yielding 1.3 liters of pleural fluid.

## 2023-07-24 ENCOUNTER — Encounter: Payer: Self-pay | Admitting: *Deleted

## 2023-11-08 ENCOUNTER — Encounter: Payer: Self-pay | Admitting: *Deleted
